# Patient Record
Sex: Female | Born: 1955
Health system: Southern US, Community
[De-identification: ages and names within clinical notes are randomized; demographics above are authoritative.]

## PROBLEM LIST (undated history)

## (undated) DIAGNOSIS — G4730 Sleep apnea, unspecified: Secondary | ICD-10-CM

## (undated) DIAGNOSIS — E119 Type 2 diabetes mellitus without complications: Secondary | ICD-10-CM

## (undated) DIAGNOSIS — R002 Palpitations: Secondary | ICD-10-CM

## (undated) DIAGNOSIS — I10 Essential (primary) hypertension: Secondary | ICD-10-CM

## (undated) DIAGNOSIS — M1990 Unspecified osteoarthritis, unspecified site: Secondary | ICD-10-CM

## (undated) DIAGNOSIS — E785 Hyperlipidemia, unspecified: Secondary | ICD-10-CM

## (undated) HISTORY — PX: ABDOMINAL HYSTERECTOMY: SHX81

## (undated) HISTORY — PX: EYE SURGERY: SHX253

---

## 1998-07-16 DIAGNOSIS — I10 Essential (primary) hypertension: Secondary | ICD-10-CM | POA: Insufficient documentation

## 2002-07-16 DIAGNOSIS — M1990 Unspecified osteoarthritis, unspecified site: Secondary | ICD-10-CM | POA: Insufficient documentation

## 2011-04-23 DIAGNOSIS — R609 Edema, unspecified: Secondary | ICD-10-CM | POA: Insufficient documentation

## 2012-01-09 HISTORY — PX: JOINT REPLACEMENT: SHX530

## 2012-02-14 DIAGNOSIS — M25561 Pain in right knee: Secondary | ICD-10-CM | POA: Insufficient documentation

## 2012-02-14 DIAGNOSIS — J069 Acute upper respiratory infection, unspecified: Secondary | ICD-10-CM | POA: Insufficient documentation

## 2012-02-14 HISTORY — DX: Pain in right knee: M25.561

## 2013-11-17 DIAGNOSIS — E119 Type 2 diabetes mellitus without complications: Secondary | ICD-10-CM | POA: Insufficient documentation

## 2014-07-16 DIAGNOSIS — G4733 Obstructive sleep apnea (adult) (pediatric): Secondary | ICD-10-CM | POA: Insufficient documentation

## 2014-07-24 ENCOUNTER — Encounter: Payer: Self-pay | Admitting: Emergency Medicine

## 2014-07-24 ENCOUNTER — Emergency Department: Payer: 59

## 2014-07-24 ENCOUNTER — Emergency Department
Admission: EM | Admit: 2014-07-24 | Discharge: 2014-07-25 | Disposition: A | Discharge status: Home / Self Care | Payer: 59 | Attending: Emergency Medicine | Admitting: Emergency Medicine

## 2014-07-24 DIAGNOSIS — I10 Essential (primary) hypertension: Secondary | ICD-10-CM | POA: Diagnosis not present

## 2014-07-24 DIAGNOSIS — L03115 Cellulitis of right lower limb: Secondary | ICD-10-CM | POA: Diagnosis not present

## 2014-07-24 DIAGNOSIS — E119 Type 2 diabetes mellitus without complications: Secondary | ICD-10-CM | POA: Insufficient documentation

## 2014-07-24 DIAGNOSIS — M25561 Pain in right knee: Secondary | ICD-10-CM | POA: Insufficient documentation

## 2014-07-24 DIAGNOSIS — R2241 Localized swelling, mass and lump, right lower limb: Secondary | ICD-10-CM | POA: Diagnosis present

## 2014-07-24 HISTORY — DX: Essential (primary) hypertension: I10

## 2014-07-24 HISTORY — DX: Unspecified osteoarthritis, unspecified site: M19.90

## 2014-07-24 HISTORY — DX: Type 2 diabetes mellitus without complications: E11.9

## 2014-07-24 NOTE — ED Notes (Signed)
Pt drove self to ED, pt reports that approx two days ago noticed redness at right knee across old knee incision mark but redness has since subsided to area marked by this RN at this time, pt also c/o tightness behind right knee, area feels tight. Pt reports ants in home and pointed out a possible bite mark but denies itching positive for HA but since HA passed and is now feeling no pain only "discomfort", abd decreased appetite.

## 2014-07-25 MED ORDER — CLINDAMYCIN HCL 300 MG PO CAPS: 300.0000 mg | ORAL_CAPSULE | Freq: Three times a day (TID) | ORAL | Status: DC

## 2014-07-25 MED ORDER — CLINDAMYCIN HCL 150 MG PO CAPS
300.0000 mg | ORAL_CAPSULE | Freq: Once | ORAL | Status: AC
  Administered 2014-07-25: 300 mg via ORAL
  Filled 2014-07-25: qty 2

## 2014-07-25 NOTE — Discharge Instructions (Signed)
Follow up with the primary care provider of your choice or return to the ER for symptoms that are not improving over the next 2 days. Return sooner if the redness extends outside of the marked area or if you develop a fever.

## 2014-07-25 NOTE — ED Provider Notes (Signed)
[facility] Emergency Department Provider Note ____________________________________________  Time seen: Approximately 12:10 AM  I have reviewed the triage vital signs and the nursing notes.   HISTORY  Chief Complaint Rash   HPI Kelly Farley is a 59 y.o. female who presents to the emergency department for right leg swelling and redness. She states that she noticed a little redness about 2 days ago, but didn't realize that it had gone around to the back of the knee. She states the back of the knee feels tight and is mildly tender. She has a 5 1/2 hour drive tomorrow and wants to have it checked out. She has never had a DVT. She questions an insect bite causing the redness, but isn't sure.   Past Medical History  Diagnosis Date  . Diabetes mellitus without complication   . Hypertension   . Arthritis     There are no active problems to display for this patient.   Past Surgical History  Procedure Laterality Date  . Abdominal hysterectomy      total  . Joint replacement  2014    right knee  . Eye surgery      Current Outpatient Rx  Name  Route  Sig  Dispense  Refill  . clindamycin (CLEOCIN) 300 MG capsule   Oral   Take 1 capsule (300 mg total) by mouth 3 (three) times daily.   30 capsule   0     Allergies Peanut-containing drug products  History reviewed. No pertinent family history.  Social History History  Substance Use Topics  . Smoking status: Never Smoker   . Smokeless tobacco: Not on file  . Alcohol Use: No    Review of Systems Constitutional: No recent illness. Eyes: No visual changes. ENT: No sore throat. Cardiovascular: Denies chest pain or palpitations. Respiratory: Denies shortness of breath. Gastrointestinal: No abdominal pain.  Genitourinary: Negative for dysuria. Musculoskeletal: Pain in right knee/lower extremity Skin: Negative for rash. Neurological: Negative for headaches, focal weakness or numbness. 10-point  ROS otherwise negative.  ____________________________________________   PHYSICAL EXAM:  VITAL SIGNS: ED Triage Vitals  Enc Vitals Group     BP 07/24/14 2113 143/64 mmHg     Pulse Rate 07/24/14 2113 73     Resp 07/24/14 2113 18     Temp 07/24/14 2113 98.3 F (36.8 C)     Temp Source 07/24/14 2113 Oral     SpO2 07/24/14 2113 97 %     Weight 07/24/14 2113 320 lb (145.151 kg)     Height 07/24/14 2113 5\' 8"  (1.727 m)     Head Cir --      Peak Flow --      Pain Score --      Pain Loc --      Pain Edu? --      Excl. in [place]? --     Constitutional: Alert and oriented. Well appearing and in no acute distress. Eyes: Conjunctivae are normal. EOMI. Head: Atraumatic. Nose: No congestion/rhinnorhea. Neck: No stridor.  Respiratory: Normal respiratory effort.   Musculoskeletal: Full ROM of right knee and lower extremity. Neurologic:  Normal speech and language. No gross focal neurologic deficits are appreciated. Speech is normal. No gait instability. Skin: Erythematous area progresses from medial to lateral knee posteriorly but does not involve the anterior portion/patella area. Erythematous area marked. No rash. Increased skin temperature over erythematous area.  Psychiatric: Mood and affect are normal. Speech and behavior are normal.  ____________________________________________   LABS (all  labs ordered are listed, but only abnormal results are displayed)  Labs Reviewed - No data to display ____________________________________________  RADIOLOGY  Negative for DVT. ____________________________________________   PROCEDURES  Procedure(s) performed: None   ____________________________________________   INITIAL IMPRESSION / ASSESSMENT AND PLAN / ED COURSE  Pertinent labs & imaging results that were available during my care of the patient were reviewed by me and considered in my medical decision making (see chart for details).  We will start clindamycin tonight.  Patient  was advised to follow-up with her primary care provider or return to the emergency department for symptoms that are not improving over the next 2 days. She was advised to return to the emergency department sooner for symptoms that change or worsen or if the redness extends outside of the marked line. She was also advised to return to the emergency department for any shortness of breath or chest pain. ____________________________________________   FINAL CLINICAL IMPRESSION(S) / ED DIAGNOSES  Final diagnoses:  Cellulitis of right lower extremity       Victorino Dike, FNP 07/25/14 0018  Hinda Kehr, MD 07/25/14 1027

## 2015-10-20 DIAGNOSIS — N951 Menopausal and female climacteric states: Secondary | ICD-10-CM

## 2015-10-20 HISTORY — DX: Menopausal and female climacteric states: N95.1

## 2016-01-20 DIAGNOSIS — M10072 Idiopathic gout, left ankle and foot: Secondary | ICD-10-CM | POA: Insufficient documentation

## 2016-01-20 DIAGNOSIS — J40 Bronchitis, not specified as acute or chronic: Secondary | ICD-10-CM | POA: Insufficient documentation

## 2018-07-16 DIAGNOSIS — E782 Mixed hyperlipidemia: Secondary | ICD-10-CM | POA: Insufficient documentation

## 2018-08-14 ENCOUNTER — Other Ambulatory Visit: Payer: Self-pay | Admitting: Internal Medicine

## 2018-08-14 DIAGNOSIS — Z1231 Encounter for screening mammogram for malignant neoplasm of breast: Secondary | ICD-10-CM

## 2018-09-24 ENCOUNTER — Ambulatory Visit
Admission: RE | Admit: 2018-09-24 | Discharge: 2018-09-24 | Disposition: A | Discharge status: Home / Self Care | Payer: 59 | Source: Ambulatory Visit | Attending: Internal Medicine | Admitting: Internal Medicine

## 2018-09-24 DIAGNOSIS — Z1231 Encounter for screening mammogram for malignant neoplasm of breast: Secondary | ICD-10-CM

## 2018-10-01 ENCOUNTER — Other Ambulatory Visit: Payer: Self-pay | Admitting: Internal Medicine

## 2018-10-02 ENCOUNTER — Other Ambulatory Visit: Payer: Self-pay | Admitting: Internal Medicine

## 2018-10-02 DIAGNOSIS — N6310 Unspecified lump in the right breast, unspecified quadrant: Secondary | ICD-10-CM

## 2018-10-02 DIAGNOSIS — Z1231 Encounter for screening mammogram for malignant neoplasm of breast: Secondary | ICD-10-CM

## 2018-10-15 ENCOUNTER — Ambulatory Visit
Admission: RE | Admit: 2018-10-15 | Discharge: 2018-10-15 | Disposition: A | Discharge status: Home / Self Care | Payer: Medicare HMO | Source: Ambulatory Visit | Attending: Internal Medicine | Admitting: Internal Medicine

## 2018-10-15 DIAGNOSIS — N6315 Unspecified lump in the right breast, overlapping quadrants: Secondary | ICD-10-CM | POA: Diagnosis present

## 2018-10-15 DIAGNOSIS — N6310 Unspecified lump in the right breast, unspecified quadrant: Secondary | ICD-10-CM | POA: Diagnosis not present

## 2018-10-15 DIAGNOSIS — Z1231 Encounter for screening mammogram for malignant neoplasm of breast: Secondary | ICD-10-CM

## 2019-01-30 ENCOUNTER — Ambulatory Visit: Payer: Medicare HMO

## 2019-07-16 DIAGNOSIS — N189 Chronic kidney disease, unspecified: Secondary | ICD-10-CM | POA: Insufficient documentation

## 2020-01-13 ENCOUNTER — Ambulatory Visit: Payer: Medicare Other | Admitting: Dermatology

## 2020-01-13 ENCOUNTER — Other Ambulatory Visit: Payer: Self-pay

## 2020-01-13 DIAGNOSIS — Z733 Stress, not elsewhere classified: Secondary | ICD-10-CM

## 2020-01-13 DIAGNOSIS — R21 Rash and other nonspecific skin eruption: Secondary | ICD-10-CM

## 2020-01-13 MED ORDER — TRIAMCINOLONE ACETONIDE 0.1 % EX CREA: 1.0000 "application " | TOPICAL_CREAM | Freq: Two times a day (BID) | CUTANEOUS | 2 refills | Status: DC

## 2020-01-13 MED ORDER — FEXOFENADINE HCL 180 MG PO TABS: 180.0000 mg | ORAL_TABLET | Freq: Every day | ORAL | 1 refills | Status: DC

## 2020-01-13 NOTE — Patient Instructions (Signed)
Recommend Allegra (Fexofenadine) 180 mg 1 tablet daily. If not improving after 2 weeks, increase to 2 tablets daily.

## 2020-01-13 NOTE — Progress Notes (Unsigned)
   New Patient Visit  Subjective  Kelly Farley is a 65 y.o. female who presents for the following: Rash (Of arms and legs x ~ 6 weeks that is very itchy. Was treated with Medrol pack and Mometsone from PCP. She lost her mother in October and has had a lot of increased stress.).  The following portions of the chart were reviewed this encounter and updated as appropriate:   Allergies  Meds  Problems  Med Hx  Surg Hx  Fam Hx     Review of Systems:  No other skin or systemic complaints except as noted in HPI or Assessment and Plan.  Objective  Well appearing patient in no apparent distress; mood and affect are within normal limits.  A focused examination was performed including arms, legs. Relevant physical exam findings are noted in the Assessment and Plan.  Objective  Left Upper Arm - Anterior: 3 mm pink papules, some with crusts scattered over arms. Some urticarial patches of arms.  Images     Assessment & Plan  Rash - Urticaria - exacerbated/flared by stress Left Arm; L leg Persistent; chronic x 6 weeks Recommend Allegra (Fexofenadine) 180 mg 1 tablet daily. If not improving after 2- 5 days, increase to 2 tablets daily.  Start TMC 0.1% cream bid - avoid face, groin, underarms.  May plan biopsy on follow up if not improving. May consider other treatments: Doxepin; Xolair in future  triamcinolone (KENALOG) 0.1 % - Left Upper Arm - Anterior  fexofenadine (ALLEGRA ALLERGY) 180 MG tablet - Left Upper Arm - Anterior  Return for 3-5 weeks .  I, Joanie Coddington, CMA, am acting as scribe for Armida Sans, MD .Documentation: I have reviewed the above documentation for accuracy and completeness, and I agree with the above.  Armida Sans, MD

## 2020-01-14 ENCOUNTER — Encounter: Payer: Self-pay | Admitting: Dermatology

## 2020-02-17 ENCOUNTER — Encounter: Payer: Self-pay | Admitting: Dermatology

## 2020-02-17 ENCOUNTER — Ambulatory Visit: Payer: Medicare Other | Admitting: Dermatology

## 2020-02-17 ENCOUNTER — Other Ambulatory Visit: Payer: Self-pay

## 2020-02-17 DIAGNOSIS — L501 Idiopathic urticaria: Secondary | ICD-10-CM | POA: Diagnosis not present

## 2020-02-17 DIAGNOSIS — L509 Urticaria, unspecified: Secondary | ICD-10-CM

## 2020-02-17 NOTE — Progress Notes (Signed)
   Follow-Up Visit   Subjective  Kelly Farley is a 65 y.o. female who presents for the following: Urticaria exacerbated/flared by stress (L arm, 5wk f/u, TMC 0.1% cr bid, Allegra 180 mg 1 po qd, sinus not getting dried out with allegra, improved).  The following portions of the chart were reviewed this encounter and updated as appropriate:   Allergies  Meds  Problems  Med Hx  Surg Hx  Fam Hx     Review of Systems:  No other skin or systemic complaints except as noted in HPI or Assessment and Plan.  Objective  Well appearing patient in no apparent distress; mood and affect are within normal limits.  A focused examination was performed including L arm. Relevant physical exam findings are noted in the Assessment and Plan.  Objective  L arm: 1 urticarial plaque L arm   Assessment & Plan  Urticaria L arm Chronic Idiopathic Urticaria Improved, but persistent - not to goal Exacerbated/flared by stress Other possibilities are bite reaction vs medication reaction. Improved on oral Allegra and topical Triamcinolone  Recommend labs -- pt has had labs this week with PCP-- will review  Increase to Allegra 2 po qhs Cont TMC 0.1% cr qd/bid, avoid f/g/a  Consider other options such as Xolair but not necessarily at this time Consider biopsy if persistent or flare,  but not necessarily this time  Recent labs from Dr. Elijio Miles, she will get a copy at appointment today and drop off at the office  Return in about 6 weeks (around 03/30/2020) for Urticaria f/u.   I, Kelly Farley, RMA, am acting as scribe for [name], MD .  Documentation: I have reviewed the above documentation for accuracy and completeness, and I agree with the above.  [name], MD

## 2020-03-10 ENCOUNTER — Other Ambulatory Visit: Payer: Self-pay | Admitting: Internal Medicine

## 2020-03-31 ENCOUNTER — Ambulatory Visit: Payer: Medicare Other | Admitting: Dermatology

## 2020-04-13 DIAGNOSIS — M25562 Pain in left knee: Secondary | ICD-10-CM | POA: Diagnosis not present

## 2020-04-13 DIAGNOSIS — M1712 Unilateral primary osteoarthritis, left knee: Secondary | ICD-10-CM | POA: Diagnosis not present

## 2020-04-21 DIAGNOSIS — M25562 Pain in left knee: Secondary | ICD-10-CM | POA: Diagnosis not present

## 2020-04-21 DIAGNOSIS — M1712 Unilateral primary osteoarthritis, left knee: Secondary | ICD-10-CM | POA: Diagnosis not present

## 2020-04-27 ENCOUNTER — Other Ambulatory Visit: Payer: Self-pay | Admitting: Dermatology

## 2020-04-27 DIAGNOSIS — R21 Rash and other nonspecific skin eruption: Secondary | ICD-10-CM

## 2020-04-28 DIAGNOSIS — M25562 Pain in left knee: Secondary | ICD-10-CM | POA: Diagnosis not present

## 2020-04-28 DIAGNOSIS — M1712 Unilateral primary osteoarthritis, left knee: Secondary | ICD-10-CM | POA: Diagnosis not present

## 2020-05-27 DIAGNOSIS — Z9842 Cataract extraction status, left eye: Secondary | ICD-10-CM | POA: Diagnosis not present

## 2020-05-27 DIAGNOSIS — Z9841 Cataract extraction status, right eye: Secondary | ICD-10-CM | POA: Diagnosis not present

## 2020-05-27 DIAGNOSIS — E119 Type 2 diabetes mellitus without complications: Secondary | ICD-10-CM | POA: Diagnosis not present

## 2020-05-27 DIAGNOSIS — H52223 Regular astigmatism, bilateral: Secondary | ICD-10-CM | POA: Diagnosis not present

## 2020-05-30 DIAGNOSIS — E119 Type 2 diabetes mellitus without complications: Secondary | ICD-10-CM | POA: Diagnosis not present

## 2020-06-14 DIAGNOSIS — M109 Gout, unspecified: Secondary | ICD-10-CM | POA: Diagnosis not present

## 2020-06-14 DIAGNOSIS — N189 Chronic kidney disease, unspecified: Secondary | ICD-10-CM | POA: Diagnosis not present

## 2020-06-14 DIAGNOSIS — E119 Type 2 diabetes mellitus without complications: Secondary | ICD-10-CM | POA: Diagnosis not present

## 2020-06-14 DIAGNOSIS — G4733 Obstructive sleep apnea (adult) (pediatric): Secondary | ICD-10-CM | POA: Diagnosis not present

## 2020-06-14 DIAGNOSIS — E785 Hyperlipidemia, unspecified: Secondary | ICD-10-CM | POA: Diagnosis not present

## 2020-06-15 ENCOUNTER — Other Ambulatory Visit: Payer: Self-pay | Admitting: Internal Medicine

## 2020-06-15 DIAGNOSIS — Z1231 Encounter for screening mammogram for malignant neoplasm of breast: Secondary | ICD-10-CM

## 2020-08-03 ENCOUNTER — Emergency Department (HOSPITAL_COMMUNITY)
Admission: EM | Admit: 2020-08-03 | Discharge: 2020-08-04 | Discharge status: Against Medical Advice | Payer: Medicare Other | Attending: Emergency Medicine | Admitting: Emergency Medicine

## 2020-08-03 ENCOUNTER — Encounter (HOSPITAL_COMMUNITY): Payer: Self-pay | Admitting: Emergency Medicine

## 2020-08-03 DIAGNOSIS — Z79899 Other long term (current) drug therapy: Secondary | ICD-10-CM | POA: Insufficient documentation

## 2020-08-03 DIAGNOSIS — Z91010 Allergy to peanuts: Secondary | ICD-10-CM | POA: Insufficient documentation

## 2020-08-03 DIAGNOSIS — E119 Type 2 diabetes mellitus without complications: Secondary | ICD-10-CM | POA: Diagnosis not present

## 2020-08-03 DIAGNOSIS — Z96651 Presence of right artificial knee joint: Secondary | ICD-10-CM | POA: Diagnosis not present

## 2020-08-03 DIAGNOSIS — I10 Essential (primary) hypertension: Secondary | ICD-10-CM | POA: Insufficient documentation

## 2020-08-03 DIAGNOSIS — K922 Gastrointestinal hemorrhage, unspecified: Secondary | ICD-10-CM | POA: Diagnosis not present

## 2020-08-03 DIAGNOSIS — Z7984 Long term (current) use of oral hypoglycemic drugs: Secondary | ICD-10-CM | POA: Diagnosis not present

## 2020-08-03 DIAGNOSIS — K921 Melena: Secondary | ICD-10-CM

## 2020-08-03 DIAGNOSIS — R1084 Generalized abdominal pain: Secondary | ICD-10-CM

## 2020-08-03 LAB — CBC
HCT: 39.7 % (ref 36.0–46.0)
Hemoglobin: 13 g/dL (ref 12.0–15.0)
MCH: 29.8 pg (ref 26.0–34.0)
MCHC: 32.7 g/dL (ref 30.0–36.0)
MCV: 91.1 fL (ref 80.0–100.0)
Platelets: 253 10*3/uL (ref 150–400)
RBC: 4.36 MIL/uL (ref 3.87–5.11)
RDW: 12.4 % (ref 11.5–15.5)
WBC: 7.6 10*3/uL (ref 4.0–10.5)
nRBC: 0 % (ref 0.0–0.2)

## 2020-08-03 LAB — COMPREHENSIVE METABOLIC PANEL
ALT: 18 U/L (ref 0–44)
AST: 25 U/L (ref 15–41)
Albumin: 3.4 g/dL — ABNORMAL LOW (ref 3.5–5.0)
Alkaline Phosphatase: 60 U/L (ref 38–126)
Anion gap: 11 (ref 5–15)
BUN: 14 mg/dL (ref 8–23)
CO2: 26 mmol/L (ref 22–32)
Calcium: 9.4 mg/dL (ref 8.9–10.3)
Chloride: 100 mmol/L (ref 98–111)
Creatinine, Ser: 1.21 mg/dL — ABNORMAL HIGH (ref 0.44–1.00)
GFR, Estimated: 49 mL/min — ABNORMAL LOW (ref >60)
Glucose, Bld: 124 mg/dL — ABNORMAL HIGH (ref 70–99)
Potassium: 3.4 mmol/L — ABNORMAL LOW (ref 3.5–5.1)
Sodium: 137 mmol/L (ref 135–145)
Total Bilirubin: 0.9 mg/dL (ref 0.3–1.2)
Total Protein: 6.9 g/dL (ref 6.5–8.1)

## 2020-08-03 LAB — TYPE AND SCREEN
ABO/RH(D): O POS
Antibody Screen: NEGATIVE

## 2020-08-03 NOTE — ED Provider Notes (Cosign Needed)
Emergency Medicine Provider Triage Evaluation Note  Flower Aburto , a 65 y.o. female  was evaluated in triage.  Pt complains of abdominal pain and Bloody stools.  Patient states this started on Monday, came on suddenly, she states that she has dark red blood in the stools on Monday, she states she had some today but  It is not as much.  She has no history of GI bleeds, no history of diverticulitis, she not on anticoagulants.  She denies feeling lightheaded, dizziness, chest pain or shortness of breath.  She states that she is having some stomach cramps but this has since resolved.    Review of Systems  Positive: Stomach cramps, melena Negative: Chest pain, shortness of breath  Physical Exam  BP 136/66   Pulse 67   Temp 98.8 F (37.1 C) (Oral)   Resp 18   Ht 5' 7.5" (1.715 m)   Wt (!) 149.7 kg   SpO2 99%   BMI 50.92 kg/m  Gen:   Awake, no distress   Resp:  Normal effort  MSK:   Moves extremities without difficulty  Other:  Was palpated no peritoneal sign, no rebound tenderness, no guarding.  Medical Decision Making  Medically screening exam initiated at 12:20 PM.  Appropriate orders placed.  Maija Susman was informed that the remainder of the evaluation will be completed by another provider, this initial triage assessment does not replace that evaluation, and the importance of remaining in the ED until their evaluation is complete.  Patient presents with bloody stools, patient appears Emergency Department.   Marcello Fennel, PA-C 08/03/20 1250

## 2020-08-03 NOTE — ED Provider Notes (Signed)
[facility] EMERGENCY DEPARTMENT Provider Note   CSN: [id number] Arrival date & time: 08/03/20  1132     History Chief Complaint  Patient presents with   Abdominal Pain    Kelly Farley is a 65 y.o. female presents to the emergency department with abdominal pain and bloody stools that started on Monday.    Upon my arrival to bedside evaluate patient she began yelling and swearing.  She states she has been in the waiting room for 12 hours and in this bed for 30 minutes without a pillow and no one has done anything for her.  She states she is leaving and she does not want to speak to anyone else.   Per the provider in triage note: Reports the stool has been dark red.  It has improved over the last several days but has not resolved.  She is no history of GI bleeds or diverticulitis.  She does not take an anticoagulant.  She denies lightheadedness, dizziness, chest pain or shortness of breath.  Also complained of associated abdominal cramping.  The history is provided by the patient and medical records. No language interpreter was used.      Past Medical History:  Diagnosis Date   Arthritis    Diabetes mellitus without complication ([place])    Hypertension     There are no problems to display for this patient.   Past Surgical History:  Procedure Laterality Date   ABDOMINAL HYSTERECTOMY     total   EYE SURGERY     JOINT REPLACEMENT  2014   right knee     OB History   No obstetric history on file.     Family History  Problem Relation Age of Onset   Breast cancer Maternal Aunt     Social History   Tobacco Use   Smoking status: Never  Substance Use Topics   Alcohol use: No    Home Medications Prior to Admission medications   Medication Sig Start Date End Date Taking? Authorizing Provider  allopurinol (ZYLOPRIM) 100 MG tablet Take 100 mg by mouth daily. 12/21/19   [provider]  amLODipine (NORVASC) 10 MG tablet Take by mouth.     [provider]  clindamycin (CLEOCIN) 300 MG capsule Take 1 capsule (300 mg total) by mouth 3 (three) times daily. 07/25/14   Triplett, Cari B, FNP  DULoxetine (CYMBALTA) 60 MG capsule Take by mouth. 03/20/16   [provider]  fexofenadine (ALLEGRA) 180 MG tablet TAKE 1 TABLET BY MOUTH EVERY DAY . IF NOT IMPROVING AFTER 2 WEEKS, INCREASE TO 2 TABLETS DAILY 04/27/20   Ralene Bathe, MD  fexofenadine-pseudoephedrine (ALLEGRA-D ALLERGY & CONGESTION) 60-120 MG 12 hr tablet TK 1 T PO  QHS 02/15/15   [provider]  glimepiride (AMARYL) 2 MG tablet Take by mouth. 08/22/16   [provider]  loteprednol (ALREX) 0.2 % SUSP  02/15/15   [provider]  metFORMIN (GLUCOPHAGE) 1000 MG tablet Take by mouth. 05/11/16   [provider]  metoprolol tartrate (LOPRESSOR) 100 MG tablet Take by mouth. 08/22/16   [provider]  mometasone (ELOCON) 0.1 % cream Apply topically. 12/07/19   [provider]  naproxen sodium (ALEVE) 220 MG tablet Take by mouth. 03/03/14   [provider]  nystatin-triamcinolone ointment (MYCOLOG) Apply 1 Application topically 3 (three) times daily. 07/13/14   [provider]  pravastatin (PRAVACHOL) 40 MG tablet Take by mouth. 02/14/16   [provider]  sitaGLIPtin (JANUVIA) 100 MG tablet Take by mouth. 05/01/16   [provider]  triamcinolone (KENALOG) 0.1 % Apply 1 application topically 2 (two) times daily. Avoid face, groin, underarms 01/13/20   Ralene Bathe, MD  valsartan-hydrochlorothiazide (DIOVAN-HCT) 320-25 MG tablet Take 1 tablet by mouth daily. 12/17/19   [provider]    Allergies    Peanut-containing drug products  Review of Systems   Review of Systems  Constitutional:  Negative for fatigue.  Respiratory:  Negative for shortness of breath.   Gastrointestinal:  Positive for abdominal pain and blood in stool.  Neurological:  Negative for syncope and weakness.    Physical Exam Updated Vital Signs BP (!) 149/73 (BP Location: Right Arm)   Pulse 64   Temp 98 F (36.7 C) (Oral)   Resp 18   Ht 5' 7.5" (1.715 m)   Wt (!) 149.7 kg   SpO2 97%   BMI 50.92 kg/m   Physical Exam Vitals and nursing note reviewed.  Constitutional:      General: She is not in acute distress.    Appearance: She is well-developed. She is not ill-appearing.     Comments: Yelling -speaks in full sentences  HENT:     Head: Normocephalic.  Eyes:     General: No scleral icterus.    Conjunctiva/sclera: Conjunctivae normal.  Cardiovascular:     Rate and Rhythm: Normal rate.  Pulmonary:     Effort: Pulmonary effort is normal.  Abdominal:     General: There is no distension.  Musculoskeletal:        General: Normal range of motion.     Cervical back: Normal range of motion.     Comments: Nasal extremities without difficulty.  Skin:    General: Skin is warm and dry.  Neurological:     Mental Status: She is alert.  Psychiatric:        Mood and Affect: Mood normal.    ED Results / Procedures / Treatments   Labs (all labs ordered are listed, but only abnormal results are displayed) Labs Reviewed  COMPREHENSIVE METABOLIC PANEL - Abnormal; Notable for the following components:      Result Value   Potassium 3.4 (*)    Glucose, Bld 124 (*)    Creatinine, Ser 1.21 (*)    Albumin 3.4 (*)    GFR, Estimated 49 (*)    All other components within normal limits  CBC  POC OCCULT BLOOD, ED  TYPE AND SCREEN  ABO/RH     Procedures Procedures   Medications Ordered in ED Medications - No data to display  ED Course  I have reviewed the triage vital signs and the nursing notes.  Pertinent labs & imaging results that were available during my care of the patient were reviewed by me and considered in my medical decision making (see chart for details).    MDM Rules/Calculators/A&P                           Presents with abdominal pain and GI bleeding.  Patient  angry upon my initial evaluation, yelling that no one has given her a pillow.  She does not want a physical evaluation by myself or any further work-up.  I have discussed with patient that without further evaluation I cannot guarantee that she does not have a medical emergency and discussed the risk of morbidity and mortality.  Patient reports that she does not care because no  one here seems to care about her.  She demands a wheelchair and to be wheeled out of the emergency department.  Patient informed that she may return at any time for additional work-up or further evaluation.  Patient left the emergency department AMA.   Final Clinical Impression(s) / ED Diagnoses Final diagnoses:  Generalized abdominal pain  Blood in stool    Rx / DC Orders ED Discharge Orders     None        Lux Meaders, Gwenlyn Perking 08/03/20 2333    Ripley Fraise, MD 08/04/20 [phone]

## 2020-08-03 NOTE — ED Triage Notes (Signed)
Pt endorses abd cramping and pain 2 days ago. Started having blood in her diarrhea yesterday. Denies blood thinners. Reports increase in aleve for knee pain.

## 2020-08-06 ENCOUNTER — Emergency Department
Admission: EM | Admit: 2020-08-06 | Discharge: 2020-08-06 | Disposition: A | Discharge status: Home / Self Care | Payer: Medicare Other | Attending: Emergency Medicine | Admitting: Emergency Medicine

## 2020-08-06 ENCOUNTER — Emergency Department: Payer: Medicare Other

## 2020-08-06 ENCOUNTER — Other Ambulatory Visit: Payer: Self-pay

## 2020-08-06 DIAGNOSIS — R141 Gas pain: Secondary | ICD-10-CM | POA: Diagnosis not present

## 2020-08-06 DIAGNOSIS — Z79899 Other long term (current) drug therapy: Secondary | ICD-10-CM | POA: Diagnosis not present

## 2020-08-06 DIAGNOSIS — R109 Unspecified abdominal pain: Secondary | ICD-10-CM | POA: Diagnosis not present

## 2020-08-06 DIAGNOSIS — I10 Essential (primary) hypertension: Secondary | ICD-10-CM | POA: Insufficient documentation

## 2020-08-06 DIAGNOSIS — Z91010 Allergy to peanuts: Secondary | ICD-10-CM | POA: Insufficient documentation

## 2020-08-06 DIAGNOSIS — K922 Gastrointestinal hemorrhage, unspecified: Secondary | ICD-10-CM

## 2020-08-06 DIAGNOSIS — Z7984 Long term (current) use of oral hypoglycemic drugs: Secondary | ICD-10-CM | POA: Insufficient documentation

## 2020-08-06 DIAGNOSIS — Z96652 Presence of left artificial knee joint: Secondary | ICD-10-CM | POA: Insufficient documentation

## 2020-08-06 LAB — COMPREHENSIVE METABOLIC PANEL
ALT: 19 U/L (ref 0–44)
AST: 36 U/L (ref 15–41)
Albumin: 3.9 g/dL (ref 3.5–5.0)
Alkaline Phosphatase: 57 U/L (ref 38–126)
Anion gap: 11 (ref 5–15)
BUN: 18 mg/dL (ref 8–23)
CO2: 26 mmol/L (ref 22–32)
Calcium: 9.3 mg/dL (ref 8.9–10.3)
Chloride: 101 mmol/L (ref 98–111)
Creatinine, Ser: 1.39 mg/dL — ABNORMAL HIGH (ref 0.44–1.00)
GFR, Estimated: 42 mL/min — ABNORMAL LOW (ref >60)
Glucose, Bld: 165 mg/dL — ABNORMAL HIGH (ref 70–99)
Potassium: 3.6 mmol/L (ref 3.5–5.1)
Sodium: 138 mmol/L (ref 135–145)
Total Bilirubin: 0.7 mg/dL (ref 0.3–1.2)
Total Protein: 7.5 g/dL (ref 6.5–8.1)

## 2020-08-06 LAB — PROTIME-INR
INR: 1.1 (ref 0.8–1.2)
Prothrombin Time: 13.7 seconds (ref 11.4–15.2)

## 2020-08-06 LAB — CBC
HCT: 40.6 % (ref 36.0–46.0)
Hemoglobin: 13.8 g/dL (ref 12.0–15.0)
MCH: 30.8 pg (ref 26.0–34.0)
MCHC: 34 g/dL (ref 30.0–36.0)
MCV: 90.6 fL (ref 80.0–100.0)
Platelets: 327 10*3/uL (ref 150–400)
RBC: 4.48 MIL/uL (ref 3.87–5.11)
RDW: 12.1 % (ref 11.5–15.5)
WBC: 4.6 10*3/uL (ref 4.0–10.5)
nRBC: 0 % (ref 0.0–0.2)

## 2020-08-06 MED ORDER — IOHEXOL 350 MG/ML SOLN
75.0000 mL | Freq: Once | INTRAVENOUS | Status: AC | PRN
  Administered 2020-08-06: 75 mL via INTRAVENOUS

## 2020-08-06 MED ORDER — FAMOTIDINE 40 MG PO TABS: 40.0000 mg | ORAL_TABLET | Freq: Every day | ORAL | 0 refills | Status: DC

## 2020-08-06 NOTE — ED Provider Notes (Signed)
[facility] Emergency Department Provider Note ____________________________________________   Event Date/Time   First MD Initiated Contact with Patient 08/06/20 1433     (approximate)  I have reviewed the triage vital signs and the nursing notes.   HISTORY  Chief Complaint Blood In Stools  HPI Kelly Farley is a 65 y.o. female with history of Diabetes and hypertension presents to the emergency department for treatment and evaluation of bright red blood in stool for the past 8 days. Symptoms started after eating a salad with avocado and had a vinegar drink. She states that her abdomen is "a little tender" and she feels gassy. When she passes gas, she notices blood. She has no constipation. Last bowel movement was this morning with streaks of blood. Mother had colon cancer. Last colonoscopy 10 years ago and was normal.     Past Medical History:  Diagnosis Date   Arthritis    Diabetes mellitus without complication ([place])    Hypertension     There are no problems to display for this patient.   Past Surgical History:  Procedure Laterality Date   ABDOMINAL HYSTERECTOMY     total   EYE SURGERY     JOINT REPLACEMENT  2014   right knee    Prior to Admission medications   Medication Sig Start Date End Date Taking? Authorizing Provider  famotidine (PEPCID) 40 MG tablet Take 1 tablet (40 mg total) by mouth daily. 08/06/20 09/05/20 Yes Payden Bonus B, FNP  allopurinol (ZYLOPRIM) 100 MG tablet Take 100 mg by mouth daily. 12/21/19   [provider]  amLODipine (NORVASC) 10 MG tablet Take by mouth.    [provider]  clindamycin (CLEOCIN) 300 MG capsule Take 1 capsule (300 mg total) by mouth 3 (three) times daily. 07/25/14   Camani Sesay B, FNP  DULoxetine (CYMBALTA) 60 MG capsule Take by mouth. 03/20/16   [provider]  fexofenadine (ALLEGRA) 180 MG tablet TAKE 1 TABLET BY MOUTH EVERY DAY . IF NOT IMPROVING AFTER 2 WEEKS, INCREASE  TO 2 TABLETS DAILY 04/27/20   Ralene Bathe, MD  fexofenadine-pseudoephedrine (ALLEGRA-D ALLERGY & CONGESTION) 60-120 MG 12 hr tablet TK 1 T PO  QHS 02/15/15   [provider]  glimepiride (AMARYL) 2 MG tablet Take by mouth. 08/22/16   [provider]  loteprednol (ALREX) 0.2 % SUSP  02/15/15   [provider]  metFORMIN (GLUCOPHAGE) 1000 MG tablet Take by mouth. 05/11/16   [provider]  metoprolol tartrate (LOPRESSOR) 100 MG tablet Take by mouth. 08/22/16   [provider]  mometasone (ELOCON) 0.1 % cream Apply topically. 12/07/19   [provider]  naproxen sodium (ALEVE) 220 MG tablet Take by mouth. 03/03/14   [provider]  nystatin-triamcinolone ointment (MYCOLOG) Apply 1 Application topically 3 (three) times daily. 07/13/14   [provider]  pravastatin (PRAVACHOL) 40 MG tablet Take by mouth. 02/14/16   [provider]  sitaGLIPtin (JANUVIA) 100 MG tablet Take by mouth. 05/01/16   [provider]  triamcinolone (KENALOG) 0.1 % Apply 1 application topically 2 (two) times daily. Avoid face, groin, underarms 01/13/20   Ralene Bathe, MD  valsartan-hydrochlorothiazide (DIOVAN-HCT) 320-25 MG tablet Take 1 tablet by mouth daily. 12/17/19   [provider]    Allergies Peanut-containing drug products  Family History  Problem Relation Age of Onset   Breast cancer Maternal Aunt     Social History Social History   Tobacco Use  Smoking status: Never  Substance Use Topics   Alcohol use: No    Review of Systems  Constitutional: No fever/chills Eyes: No visual changes. ENT: No sore throat. Cardiovascular: Denies chest pain. Respiratory: Denies shortness of breath. Gastrointestinal: Positive for abdominal pain.  No nausea, no vomiting.  No diarrhea.  No constipation. Positive for BRBPR. Genitourinary: Negative for dysuria. Musculoskeletal: Negative for back pain. Skin: Negative for  rash. Neurological: Negative for headaches, focal weakness or numbness. ____________________________________________   PHYSICAL EXAM:  VITAL SIGNS: ED Triage Vitals [08/06/20 1252]  Enc Vitals Group     BP 133/60     Pulse Rate 73     Resp 18     Temp      Temp src      SpO2 100 %     Weight (!) 330 lb (149.7 kg)     Height '5\' 7"'$  (1.702 m)     Head Circumference      Peak Flow      Pain Score 0     Pain Loc      Pain Edu?      Excl. in [place]?     Constitutional: Alert and oriented. Well appearing and in no acute distress. Eyes: Conjunctivae are normal. Head: Atraumatic. Nose: No congestion/rhinnorhea. Mouth/Throat: Mucous membranes are moist.  Oropharynx non-erythematous. Neck: No stridor.   Hematological/Lymphatic/Immunilogical: No cervical lymphadenopathy. Cardiovascular: Normal rate, regular rhythm. Grossly normal heart sounds.  Good peripheral circulation. Respiratory: Normal respiratory effort.  No retractions. Lungs CTAB. Gastrointestinal: Soft with tenderness in left mid and lower quadrant. No distention. No abdominal bruits.  Genitourinary:  Musculoskeletal: No lower extremity tenderness nor edema.  No joint effusions. Neurologic:  Normal speech and language. No gross focal neurologic deficits are appreciated. No gait instability. Skin:  Skin is warm, dry and intact. No rash noted. Psychiatric: Mood and affect are normal. Speech and behavior are normal.  ____________________________________________   LABS (all labs ordered are listed, but only abnormal results are displayed)  Labs Reviewed  COMPREHENSIVE METABOLIC PANEL - Abnormal; Notable for the following components:      Result Value   Glucose, Bld 165 (*)    Creatinine, Ser 1.39 (*)    GFR, Estimated 42 (*)    All other components within normal limits  CBC  PROTIME-INR  POC OCCULT BLOOD, ED  TYPE AND SCREEN   ____________________________________________  EKG  Not  indicated. ____________________________________________  RADIOLOGY  ED MD interpretation:    CT abdomen and pelvis negative for acute concerns.  I, Sherrie George, personally viewed and evaluated these images (plain radiographs) as part of my medical decision making, as well as reviewing the written report by the radiologist.  Official radiology report(s): CT ABDOMEN PELVIS W CONTRAST  Result Date: 08/06/2020 CLINICAL DATA:  Left lower quadrant abdominal pain and blood in stools. EXAM: CT ABDOMEN AND PELVIS WITH CONTRAST TECHNIQUE: Multidetector CT imaging of the abdomen and pelvis was performed using the standard protocol following bolus administration of intravenous contrast. CONTRAST:  25m OMNIPAQUE IOHEXOL 350 MG/ML SOLN COMPARISON:  Chest FINDINGS: Lower chest: No acute abnormality. Hepatobiliary: No focal liver abnormality is seen. No gallstones, gallbladder wall thickening, or biliary dilatation. Pancreas: Unremarkable. No pancreatic ductal dilatation or surrounding inflammatory changes. Spleen: Normal in size without focal abnormality. Adrenals/Urinary Tract: Adrenal glands are unremarkable. Multiple bilateral renal cysts, largest on the left measures 3.4 cm. No suspicious solid mass. No hydronephrosis or renal calculi. Urinary bladder is unremarkable. Stomach/Bowel: Stomach is within normal limits.  Small appendicolith within the appendix which is otherwise normal in appearance. No evidence of bowel wall thickening, distention, or inflammatory changes. Vascular/Lymphatic: Aortic atherosclerosis. No enlarged abdominal or pelvic lymph nodes. Reproductive: Status post hysterectomy. No adnexal masses. Other: There is a moderate-sized fat containing umbilical hernia. No abdominopelvic ascites. Musculoskeletal: Multilevel degenerative disc disease in the lumbar spine. IMPRESSION: 1. No acute intra-abdominal pathology. 2. Moderate-sized fat containing umbilical hernia. 3. Aortic atherosclerosis.  Aortic Atherosclerosis (ICD10-I70.0). Electronically Signed   By: Audie Pinto M.D.   On: 08/06/2020 15:42    ____________________________________________   PROCEDURES  Procedure(s) performed (including Critical Care):  Procedures  ____________________________________________   INITIAL IMPRESSION / ASSESSMENT AND PLAN     65 year old female presenting to the emergency department for treatment and evaluation of rectal bleeding. See HPI. Labs that have resulted while awaiting ER room assignment are reassuring. She is not currently on blood thinner, but states that she has been taking more aleve due to knee pain. Last dose was 3 days ago.   DIFFERENTIAL DIAGNOSIS  Differential diagnosis includes, but is not limited to,  diverticulitis, urinary tract infection/pyelonephritis, bowel obstruction, colitis, renal colic, gastroenteritis, hernia  ED COURSE  On exam, no external hemorrhoids. Hematochezia on internal exam. No blood passing externally.  CT of the abdomen and pelvis reassuring. Labs are reassuring as well. No indication of anemia.   Results discussed with patient. Plan will be to touch base with GI as she does not currently have an established provider.   Discussed with Dr. Bonna Gains who will see patient in clinic this coming week.   Plan is to discharge home with Pepcid Rx and strict ER return precautions. Patient is aware and agrees to the plan.    ___________________________________________   FINAL CLINICAL IMPRESSION(S) / ED DIAGNOSES  Final diagnoses:  Gastrointestinal hemorrhage, unspecified gastrointestinal hemorrhage type     ED Discharge Orders          Ordered    famotidine (PEPCID) 40 MG tablet  Daily        08/06/20 1703             Consepcion Zeni was evaluated in Emergency Department on 08/06/2020 for the symptoms described in the history of present illness. She was evaluated in the context of the global COVID-19 pandemic, which  necessitated consideration that the patient might be at risk for infection with the SARS-CoV-2 virus that causes COVID-19. Institutional protocols and algorithms that pertain to the evaluation of patients at risk for COVID-19 are in a state of rapid change based on information released by regulatory bodies including the CDC and federal and state organizations. These policies and algorithms were followed during the patient's care in the ED.   Note:  This document was prepared using Dragon voice recognition software and may include unintentional dictation errors.    Victorino Dike, FNP 08/06/20 1706    Vladimir Crofts, MD 08/06/20 [phone]

## 2020-08-06 NOTE — Discharge Instructions (Addendum)
Return to the ER immediately if you begin to have large amounts of bright red blood from rectum.  Follow up with GI this week. Someone from the clinic should call you on Monday. If you don't hear anything by late afternoon, please call.

## 2020-08-06 NOTE — ED Notes (Signed)
Pt transported to CT via stretcher at this time.  

## 2020-08-06 NOTE — ED Notes (Signed)
Pt provided with pillow at this time.

## 2020-08-06 NOTE — ED Triage Notes (Addendum)
Pt states that she has been having blood in her stools since Tuesday- pt states that every time she passes gas and has BM she has blood- pt states that she noticed clots in her last BM but on Tuesday she was having bright red blood in her diarrhea- pt states that she was at [facility] on Thursday for same and was never seen by a dr- pt denies blood thinner use, but states she had increased use of ibuprofen for knee pain

## 2020-08-06 NOTE — ED Notes (Signed)
Pt assisted to BR with use of cane. Pt tolerated well.

## 2020-08-09 ENCOUNTER — Other Ambulatory Visit: Payer: Self-pay

## 2020-08-09 ENCOUNTER — Ambulatory Visit: Payer: Medicare Other | Admitting: Gastroenterology

## 2020-08-09 VITALS — BP 127/79 | HR 66 | Temp 98.5°F | Wt 326.4 lb

## 2020-08-09 DIAGNOSIS — K625 Hemorrhage of anus and rectum: Secondary | ICD-10-CM | POA: Diagnosis not present

## 2020-08-09 MED ORDER — NA SULFATE-K SULFATE-MG SULF 17.5-3.13-1.6 GM/177ML PO SOLN: 1.0000 | Freq: Once | ORAL | 0 refills | Status: AC

## 2020-08-09 NOTE — Progress Notes (Signed)
Kelly Farley [address]  [place]  [place], [place] [address]  Main: [phone]  Fax: [phone]   Gastroenterology Consultation  Referring Provider:     Jodi Marble, MD Primary Care Physician:  Jodi Marble, MD Reason for Consultation: Bright red blood per rectum        HPI:    Chief Complaint  Patient presents with   New Patient (Initial Visit)   Hospitalization Follow-up  ER follow-up for bright red blood per rectum  Kelly Farley is a 65 y.o. y/o female referred for consultation & management  by Dr. Jodi Marble, MD. patient reports 1 week history of bright red blood per rectum.  States symptoms initially started as abdominal discomfort, and she had a formed bowel movement on that day, followed by another bowel movement in 2 to 3 hours that consisted of blood.  The next day, symptoms reoccurred and she went to the ER but due to the wait times, did not stay longer and went home.  CBC on 08/03/2020 was normal.  Symptoms continue to recur at home and therefore she visited the ER again 3 days later on 08/06/2020 and hemoglobin was again normal and patient was discharged with GI follow-up.  Patient states she has not seen any further blood in her stool since ER discharge on 08/06/2020.  Prior to this she was seen both bright red blood and blood clots.  No prior history of similar symptoms.  Describes having 1-2 formed bowel movements a day, without straining.  Reports family history of colon cancer in mother.  States had a colonoscopy about 10 years ago and was normal.  Reports having 3-4 colonoscopies in her lifetime and they have all been normal with no personal  history of polyps.  Denies abdominal pain, nausea or vomiting, dysphagia, weight loss.  ER notes from 08/03/2020 note that patient presented with abdominal pain and GI bleeding, but did not allow for physical evaluation and left AMA.  ER notes from 08/06/2020 reviewed and report that  hematochezia was seen on rectal exam, with no blood passing externally, no external hemorrhoids noted.  Past Medical History:  Diagnosis Date   Arthritis    Diabetes mellitus without complication ([place])    Hypertension     Past Surgical History:  Procedure Laterality Date   ABDOMINAL HYSTERECTOMY     total   EYE SURGERY     JOINT REPLACEMENT  2014   right knee    Prior to Admission medications   Medication Sig Start Date End Date Taking? Authorizing Provider  allopurinol (ZYLOPRIM) 100 MG tablet Take 100 mg by mouth daily. 12/21/19  Yes [provider]  amLODipine (NORVASC) 10 MG tablet Take by mouth.   Yes [provider]  dapagliflozin propanediol (FARXIGA) 5 MG TABS tablet Take by mouth daily.   Yes [provider]  famotidine (PEPCID) 40 MG tablet Take 1 tablet (40 mg total) by mouth daily. 08/06/20 09/05/20 Yes Triplett, Cari B, FNP  fexofenadine (ALLEGRA) 180 MG tablet TAKE 1 TABLET BY MOUTH EVERY DAY . IF NOT IMPROVING AFTER 2 WEEKS, INCREASE TO 2 TABLETS DAILY 04/27/20  Yes Ralene Bathe, MD  fexofenadine-pseudoephedrine (ALLEGRA-D) 60-120 MG 12 hr tablet TK 1 T PO  QHS 02/15/15  Yes [provider]  glimepiride (AMARYL) 2 MG tablet Take by mouth. 08/22/16  Yes [provider]  hydrALAZINE (APRESOLINE) 25 MG tablet Take 25 mg by mouth 3 (three) times daily.   Yes  [provider]  loteprednol (ALREX) 0.2 % SUSP  02/15/15  Yes [provider]  metoprolol tartrate (LOPRESSOR) 100 MG tablet Take by mouth. 08/22/16  Yes [provider]  mometasone (ELOCON) 0.1 % cream Apply topically. 12/07/19  Yes [provider]  Na Sulfate-K Sulfate-Mg Sulf 17.5-3.13-1.6 GM/177ML SOLN Take 1 kit by mouth once for 1 dose. 08/09/20 08/09/20 Yes Kelly Farley B, MD  naproxen sodium (ALEVE) 220 MG tablet Take by mouth. 03/03/14  Yes [provider]  nystatin-triamcinolone ointment (MYCOLOG) Apply 1 Application  topically 3 (three) times daily. 07/13/14  Yes [provider]  pravastatin (PRAVACHOL) 40 MG tablet Take by mouth. 02/14/16  Yes [provider]  triamcinolone (KENALOG) 0.1 % Apply 1 application topically 2 (two) times daily. Avoid face, groin, underarms 01/13/20  Yes Ralene Bathe, MD  valsartan-hydrochlorothiazide (DIOVAN-HCT) 320-25 MG tablet Take 1 tablet by mouth daily. 12/17/19  Yes [provider]    Family History  Problem Relation Age of Onset   Breast cancer Maternal Aunt      Social History   Tobacco Use   Smoking status: Never  Substance Use Topics   Alcohol use: No    Allergies as of 08/09/2020 - Review Complete 08/09/2020  Allergen Reaction Noted   Peanut-containing drug products Swelling and Rash 07/24/2014    Review of Systems:    All systems reviewed and negative except where noted in HPI.   Physical Exam:  Constitutional: General:   Alert,  Well-developed, well-nourished, pleasant and cooperative in NAD BP 127/79   Pulse 66   Temp 98.5 F (36.9 C) (Oral)   Wt (!) 326 lb 6.4 oz (148.1 kg)   BMI 51.12 kg/m   Eyes:  Sclera clear, no icterus.   Conjunctiva pink. PERRLA  Ears:  No scars, lesions or masses, Normal auditory acuity. Nose:  No deformity, discharge, or lesions. Mouth:  No deformity or lesions, oropharynx pink & moist.  Neck:  Supple; no masses or thyromegaly.  Respiratory: Normal respiratory effort, Normal percussion  Gastrointestinal:  No bruits.  Soft, non-tender and non-distended without masses, hepatosplenomegaly or hernias noted.  No guarding or rebound tenderness.     Cardiac: No clubbing or edema.  No cyanosis. Normal posterior tibial pedal pulses noted.  Lymphatic:  No significant cervical or axillary adenopathy.  Psych:  Alert and cooperative. Normal mood and affect.  Musculoskeletal:  Normal gait. Head normocephalic, atraumatic. Symmetrical without gross deformities. 5/5 Upper and Lower extremity  strength bilaterally.  Skin: Warm. Intact without significant lesions or rashes. No jaundice.  Neurologic:  Face symmetrical, tongue midline, Normal sensation to touch;  grossly normal neurologically.  Psych:  Alert and oriented x3, Alert and cooperative. Normal mood and affect.   Labs: CBC    Component Value Date/Time   WBC 4.6 08/06/2020 1254   RBC 4.48 08/06/2020 1254   HGB 13.8 08/06/2020 1254   HCT 40.6 08/06/2020 1254   PLT 327 08/06/2020 1254   MCV 90.6 08/06/2020 1254   MCH 30.8 08/06/2020 1254   MCHC 34.0 08/06/2020 1254   RDW 12.1 08/06/2020 1254   CMP     Component Value Date/Time   NA 138 08/06/2020 1254   K 3.6 08/06/2020 1254   CL 101 08/06/2020 1254   CO2 26 08/06/2020 1254   GLUCOSE 165 (H) 08/06/2020 1254   BUN 18 08/06/2020 1254   CREATININE 1.39 (H) 08/06/2020 1254   CALCIUM 9.3 08/06/2020 1254   PROT 7.5 08/06/2020 1254  ALBUMIN 3.9 08/06/2020 1254   AST 36 08/06/2020 1254   ALT 19 08/06/2020 1254   ALKPHOS 57 08/06/2020 1254   BILITOT 0.7 08/06/2020 1254   GFRNONAA 42 (L) 08/06/2020 1254    Imaging Studies: CT abdomen pelvis July 2022, with no acute intra-abdominal pathology, moderate sized fat-containing umbilical hernia  Assessment and Plan:   Kelly Farley is a 65 y.o. y/o female has been referred for bright red blood per rectum that started last week, with normal hemoglobin x2 since then, and no further episodes for the last 3 days  Given completely normal hemoglobin 3 days apart since symptoms started, symptoms most likely consistent with likely bleeding from possible internal hemorrhoids  However, patient has not had colonoscopy in 10 years according to her.  And has family history of colon cancer in mother.  Therefore, colonoscopy indicated for family history of colon cancer and further evaluation of bright red blood per rectum that occurred last week  Important to rule out malignancy, evaluate for hemorrhoids, polyps, AVMs  I have  discussed alternative options, risks & benefits,  which include, but are not limited to, bleeding, infection, perforation,respiratory complication & drug reaction.  The patient agrees with this plan & written consent will be obtained.    With patient's BMI, colonoscopy would be best done at [facility], instead of [place]  CT scan reassuring as per above treatment.  CBC reassuring, normal liver enzymes reassuring.  Dr Kelly Farley  Speech recognition software was used to dictate the above note.

## 2020-08-10 ENCOUNTER — Encounter: Payer: Self-pay | Admitting: Gastroenterology

## 2020-08-10 NOTE — Addendum Note (Signed)
Addended by: Lurlean Nanny on: 08/10/2020 08:31 AM   Modules accepted: Orders, SmartSet

## 2020-08-15 ENCOUNTER — Telehealth: Payer: Self-pay

## 2020-08-15 NOTE — Telephone Encounter (Signed)
Pt wanted confirmation of low fiber foods and medications she can take... pt is aware and expressed understanding

## 2020-08-18 ENCOUNTER — Other Ambulatory Visit: Payer: Self-pay

## 2020-08-18 ENCOUNTER — Ambulatory Visit
Admission: RE | Admit: 2020-08-18 | Discharge: 2020-08-18 | Disposition: A | Discharge status: Home / Self Care | Payer: Medicare Other | Attending: Gastroenterology | Admitting: Gastroenterology

## 2020-08-18 ENCOUNTER — Encounter: Payer: Self-pay | Admitting: Gastroenterology

## 2020-08-18 ENCOUNTER — Ambulatory Visit: Payer: Medicare Other | Admitting: Anesthesiology

## 2020-08-18 ENCOUNTER — Encounter
Admission: RE | Disposition: A | Discharge status: Home / Self Care | Payer: Self-pay | Source: Home / Self Care | Attending: Gastroenterology

## 2020-08-18 DIAGNOSIS — Z7984 Long term (current) use of oral hypoglycemic drugs: Secondary | ICD-10-CM | POA: Diagnosis not present

## 2020-08-18 DIAGNOSIS — Z888 Allergy status to other drugs, medicaments and biological substances status: Secondary | ICD-10-CM | POA: Diagnosis not present

## 2020-08-18 DIAGNOSIS — Z79899 Other long term (current) drug therapy: Secondary | ICD-10-CM | POA: Diagnosis not present

## 2020-08-18 DIAGNOSIS — D120 Benign neoplasm of cecum: Secondary | ICD-10-CM | POA: Diagnosis not present

## 2020-08-18 DIAGNOSIS — Z1211 Encounter for screening for malignant neoplasm of colon: Secondary | ICD-10-CM | POA: Insufficient documentation

## 2020-08-18 DIAGNOSIS — K635 Polyp of colon: Secondary | ICD-10-CM | POA: Diagnosis not present

## 2020-08-18 DIAGNOSIS — K625 Hemorrhage of anus and rectum: Secondary | ICD-10-CM

## 2020-08-18 DIAGNOSIS — Z800 Family history of malignant neoplasm of digestive organs: Secondary | ICD-10-CM | POA: Insufficient documentation

## 2020-08-18 DIAGNOSIS — K648 Other hemorrhoids: Secondary | ICD-10-CM | POA: Diagnosis not present

## 2020-08-18 DIAGNOSIS — K649 Unspecified hemorrhoids: Secondary | ICD-10-CM | POA: Diagnosis not present

## 2020-08-18 DIAGNOSIS — G4730 Sleep apnea, unspecified: Secondary | ICD-10-CM | POA: Diagnosis not present

## 2020-08-18 DIAGNOSIS — Z96651 Presence of right artificial knee joint: Secondary | ICD-10-CM | POA: Insufficient documentation

## 2020-08-18 DIAGNOSIS — K5730 Diverticulosis of large intestine without perforation or abscess without bleeding: Secondary | ICD-10-CM | POA: Insufficient documentation

## 2020-08-18 HISTORY — PX: COLONOSCOPY WITH PROPOFOL: SHX5780

## 2020-08-18 HISTORY — DX: Hyperlipidemia, unspecified: E78.5

## 2020-08-18 HISTORY — DX: Sleep apnea, unspecified: G47.30

## 2020-08-18 LAB — GLUCOSE, CAPILLARY: Glucose-Capillary: 136 mg/dL — ABNORMAL HIGH (ref 70–99)

## 2020-08-18 SURGERY — COLONOSCOPY WITH PROPOFOL
Anesthesia: General

## 2020-08-18 MED ORDER — PROPOFOL 10 MG/ML IV BOLUS
INTRAVENOUS | Status: DC | PRN
  Administered 2020-08-18: 70 mg via INTRAVENOUS

## 2020-08-18 MED ORDER — PROPOFOL 500 MG/50ML IV EMUL
INTRAVENOUS | Status: DC | PRN
  Administered 2020-08-18: 100 ug/kg/min via INTRAVENOUS

## 2020-08-18 MED ORDER — DEXMEDETOMIDINE (PRECEDEX) IN NS 20 MCG/5ML (4 MCG/ML) IV SYRINGE
PREFILLED_SYRINGE | INTRAVENOUS | Status: DC | PRN
  Administered 2020-08-18: 8 ug via INTRAVENOUS

## 2020-08-18 MED ORDER — SODIUM CHLORIDE 0.9 % IV SOLN: INTRAVENOUS | Status: DC

## 2020-08-18 MED ORDER — EPHEDRINE SULFATE 50 MG/ML IJ SOLN
INTRAMUSCULAR | Status: DC | PRN
  Administered 2020-08-18: 10 mg via INTRAVENOUS
  Administered 2020-08-18: 5 mg via INTRAVENOUS
  Administered 2020-08-18: 10 mg via INTRAVENOUS

## 2020-08-18 NOTE — Anesthesia Postprocedure Evaluation (Signed)
Anesthesia Post Note  Patient: Kelly Farley  Procedure(s) Performed: COLONOSCOPY WITH PROPOFOL  Patient location during evaluation: Endoscopy Anesthesia Type: General Level of consciousness: awake and alert Pain management: pain level controlled Vital Signs Assessment: post-procedure vital signs reviewed and stable Respiratory status: spontaneous breathing, nonlabored ventilation, respiratory function stable and patient connected to nasal cannula oxygen Cardiovascular status: blood pressure returned to baseline and stable Postop Assessment: no apparent nausea or vomiting Anesthetic complications: no   No notable events documented.   Last Vitals:  Vitals:   08/18/20 1010 08/18/20 1020  BP: (!) 93/54 111/73  Pulse: 68 66  Resp: (!) 21 (!) 22  Temp:    SpO2: 100% 100%    Last Pain:  Vitals:   08/18/20 0950  TempSrc: Temporal  PainSc:                  Precious Haws Javares Kaufhold

## 2020-08-18 NOTE — Op Note (Signed)
[facility] Gastroenterology Patient Name: Kelly Farley Procedure Date: 08/18/2020 9:19 AM MRN: [id number] Account #: [id number] Date of Birth: [birth year] Admit Type: Outpatient Age: 65 Room: [facility] ENDO ROOM 4 Gender: Female Note Status: Finalized Procedure:             Colonoscopy Indications:           Screening for colorectal malignant neoplasm Providers:             Francie Keeling B. Bonna Gains MD, MD Referring MD:          Venetia Maxon. Elijio Miles, MD (Referring MD) Medicines:             Monitored Anesthesia Care Complications:         No immediate complications. Procedure:             Pre-Anesthesia Assessment:                        - ASA Grade Assessment: II - A patient with mild                         systemic disease.                        - Prior to the procedure, a History and Physical was                         performed, and patient medications, allergies and                         sensitivities were reviewed. The patient's tolerance                         of previous anesthesia was reviewed.                        - The risks and benefits of the procedure and the                         sedation options and risks were discussed with the                         patient. All questions were answered and informed                         consent was obtained.                        - Patient identification and proposed procedure were                         verified prior to the procedure by the physician, the                         nurse, the anesthesiologist, the anesthetist and the                         technician. The procedure was verified in the                         procedure  room.                        After obtaining informed consent, the colonoscope was                         passed under direct vision. Throughout the procedure,                         the patient's blood pressure, pulse, and oxygen                         saturations were  monitored continuously. The                         Colonoscope was introduced through the anus and                         advanced to the the cecum, identified by appendiceal                         orifice and ileocecal valve. The colonoscopy was                         performed with ease. The patient tolerated the                         procedure well. The quality of the bowel preparation                         was good. Findings:      The perianal and digital rectal examinations were normal.      A 6 mm polyp was found in the cecum. The polyp was sessile. The polyp       was removed with a cold snare. Resection and retrieval were complete.      A 4 mm polyp was found in the sigmoid colon. The polyp was sessile. The       polyp was removed with a cold snare. Resection and retrieval were       complete.      Multiple diverticula were found in the sigmoid colon.      The exam was otherwise without abnormality.      The rectum, sigmoid colon, descending colon, transverse colon, ascending       colon and cecum appeared normal.      Non-bleeding internal hemorrhoids were found during retroflexion. Impression:            - One 6 mm polyp in the cecum, removed with a cold                         snare. Resected and retrieved.                        - One 4 mm polyp in the sigmoid colon, removed with a                         cold snare. Resected and retrieved.                        -  Diverticulosis in the sigmoid colon.                        - The examination was otherwise normal.                        - The rectum, sigmoid colon, descending colon,                         transverse colon, ascending colon and cecum are normal.                        - Non-bleeding internal hemorrhoids. Recommendation:        - Discharge patient to home (with escort).                        - Advance diet as tolerated.                        - Continue present medications.                        -  Await pathology results.                        - Repeat colonoscopy date to be determined after                         pending pathology results are reviewed.                        - The findings and recommendations were discussed with                         the patient.                        - The findings and recommendations were discussed with                         the patient's family.                        - Return to primary care physician as previously                         scheduled.                        - High fiber diet. Procedure Code(s):     --- Professional ---                        [phone], Colonoscopy, flexible; with removal of                         tumor(s), polyp(s), or other lesion(s) by snare                         technique Diagnosis Code(s):     --- Professional ---  Z12.11, Encounter for screening for malignant neoplasm                         of colon                        K63.5, Polyp of colon CPT copyright 2019 American Medical Association. All rights reserved. The codes documented in this report are preliminary and upon coder review may  be revised to meet current compliance requirements.  Vonda Antigua, MD Margretta Sidle B. Bonna Gains MD, MD 08/18/2020 10:05:39 AM This report has been signed electronically. Number of Addenda: 0 Note Initiated On: 08/18/2020 9:19 AM Scope Withdrawal Time: 0 hours 17 minutes 40 seconds  Total Procedure Duration: 0 hours 25 minutes 56 seconds  Estimated Blood Loss:  Estimated blood loss: none.      [facility]

## 2020-08-18 NOTE — Transfer of Care (Signed)
Immediate Anesthesia Transfer of Care Note  Patient: Kelly Farley  Procedure(s) Performed: COLONOSCOPY WITH PROPOFOL  Patient Location: PACU  Anesthesia Type:General  Level of Consciousness: awake, alert  and oriented  Airway & Oxygen Therapy: Patient Spontanous Breathing  Post-op Assessment: Report given to RN and Post -op Vital signs reviewed and stable  Post vital signs: Reviewed and stable  Last Vitals:  Vitals Value Taken Time  BP 89/47 08/18/20 0958  Temp    Pulse 67 08/18/20 0958  Resp 20 08/18/20 0958  SpO2 100 % 08/18/20 0958  Vitals shown include unvalidated device data.  Last Pain:  Vitals:   08/18/20 0858  TempSrc: Temporal  PainSc: 0-No pain         Complications: No notable events documented.

## 2020-08-18 NOTE — Anesthesia Preprocedure Evaluation (Signed)
Anesthesia Evaluation  Patient identified by MRN, date of birth, ID band Patient awake    Reviewed: Allergy & Precautions, NPO status , Patient's Chart, lab work & pertinent test results  History of Anesthesia Complications Negative for: history of anesthetic complications  Airway Mallampati: III  TM Distance: >3 FB Neck ROM: limited    Dental  (+) Chipped   Pulmonary neg shortness of breath, sleep apnea ,    Pulmonary exam normal        Cardiovascular Exercise Tolerance: Good hypertension, (-) angina(-) Past MI and (-) DOE Normal cardiovascular exam     Neuro/Psych negative neurological ROS  negative psych ROS   GI/Hepatic negative GI ROS, Neg liver ROS,   Endo/Other  diabetes, Type 2Morbid obesity  Renal/GU negative Renal ROS  negative genitourinary   Musculoskeletal   Abdominal   Peds  Hematology negative hematology ROS (+)   Anesthesia Other Findings Past Medical History: No date: Arthritis No date: Diabetes mellitus without complication (HCC) No date: Hyperlipemia No date: Hypertension No date: Sleep apnea  Past Surgical History: No date: ABDOMINAL HYSTERECTOMY     Comment:  total No date: EYE SURGERY 2014: JOINT REPLACEMENT     Comment:  right knee  BMI    Body Mass Index: 49.53 kg/m      Reproductive/Obstetrics negative OB ROS                             Anesthesia Physical Anesthesia Plan  ASA: 3  Anesthesia Plan: General   Post-op Pain Management:    Induction: Intravenous  PONV Risk Score and Plan: Propofol infusion and TIVA  Airway Management Planned: Natural Airway and Nasal Cannula  Additional Equipment:   Intra-op Plan:   Post-operative Plan:   Informed Consent: I have reviewed the patients History and Physical, chart, labs and discussed the procedure including the risks, benefits and alternatives for the proposed anesthesia with the patient or  authorized representative who has indicated his/her understanding and acceptance.     Dental Advisory Given  Plan Discussed with: Anesthesiologist, CRNA and Surgeon  Anesthesia Plan Comments: (Patient consented for risks of anesthesia including but not limited to:  - adverse reactions to medications - risk of airway placement if required - damage to eyes, teeth, lips or other oral mucosa - nerve damage due to positioning  - sore throat or hoarseness - Damage to heart, brain, nerves, lungs, other parts of body or loss of life  Patient voiced understanding.)        Anesthesia Quick Evaluation

## 2020-08-18 NOTE — H&P (Signed)
Vonda Antigua, MD [address] [place], [place], [place], [address] [address], [place], [place], [place], [address] Phone: [phone]  Fax: [phone]  Primary Care Physician:  Jodi Marble, MD   Pre-Procedure History & Physical: HPI:  Kelly Farley is a 65 y.o. female is here for a colonoscopy.   Past Medical History:  Diagnosis Date   Arthritis    Diabetes mellitus without complication ([place])    Hyperlipemia    Hypertension    Sleep apnea     Past Surgical History:  Procedure Laterality Date   ABDOMINAL HYSTERECTOMY     total   EYE SURGERY     JOINT REPLACEMENT  2014   right knee    Prior to Admission medications   Medication Sig Start Date End Date Taking? Authorizing Provider  allopurinol (ZYLOPRIM) 100 MG tablet Take 100 mg by mouth daily. 12/21/19  Yes [provider]  amLODipine (NORVASC) 10 MG tablet Take by mouth.   Yes [provider]  dapagliflozin propanediol (FARXIGA) 5 MG TABS tablet Take by mouth daily.   Yes [provider]  famotidine (PEPCID) 40 MG tablet Take 1 tablet (40 mg total) by mouth daily. 08/06/20 09/05/20 Yes Triplett, Cari B, FNP  fexofenadine (ALLEGRA) 180 MG tablet TAKE 1 TABLET BY MOUTH EVERY DAY . IF NOT IMPROVING AFTER 2 WEEKS, INCREASE TO 2 TABLETS DAILY 04/27/20  Yes Ralene Bathe, MD  fexofenadine-pseudoephedrine (ALLEGRA-D) 60-120 MG 12 hr tablet TK 1 T PO  QHS 02/15/15  Yes [provider]  glimepiride (AMARYL) 2 MG tablet Take by mouth. 08/22/16  Yes [provider]  hydrALAZINE (APRESOLINE) 25 MG tablet Take 25 mg by mouth 3 (three) times daily.   Yes [provider]  loteprednol (ALREX) 0.2 % SUSP  02/15/15  Yes [provider]  metoprolol tartrate (LOPRESSOR) 100 MG tablet Take by mouth. 08/22/16  Yes [provider]  mometasone (ELOCON) 0.1 % cream Apply topically. 12/07/19  Yes [provider]  naproxen sodium (ALEVE) 220 MG tablet  Take by mouth. 03/03/14  Yes [provider]  nystatin-triamcinolone ointment (MYCOLOG) Apply 1 Application topically 3 (three) times daily. 07/13/14  Yes [provider]  pravastatin (PRAVACHOL) 40 MG tablet Take by mouth. 02/14/16  Yes [provider]  triamcinolone (KENALOG) 0.1 % Apply 1 application topically 2 (two) times daily. Avoid face, groin, underarms 01/13/20  Yes Ralene Bathe, MD  valsartan-hydrochlorothiazide (DIOVAN-HCT) 320-25 MG tablet Take 1 tablet by mouth daily. 12/17/19  Yes [provider]    Allergies as of 08/10/2020 - Review Complete 08/09/2020  Allergen Reaction Noted   Peanut-containing drug products Swelling and Rash 07/24/2014    Family History  Problem Relation Age of Onset   Colon cancer Mother    Breast cancer Maternal Aunt     Social History   Socioeconomic History   Marital status: Single    Spouse name: Not on file   Number of children: Not on file   Years of education: Not on file   Highest education level: Not on file  Occupational History   Not on file  Tobacco Use   Smoking status: Never   Smokeless tobacco: Not on file  Substance and Sexual Activity   Alcohol use: No   Drug use: Not on file   Sexual activity: Not on file  Other Topics Concern   Not on file  Social History Narrative   Not on file   Social Determinants of  Health   Financial Resource Strain: Not on file  Food Insecurity: Not on file  Transportation Needs: Not on file  Physical Activity: Not on file  Stress: Not on file  Social Connections: Not on file  Intimate Partner Violence: Not on file    Review of Systems: See HPI, otherwise negative ROS  Physical Exam: Constitutional: General:   Alert,  Well-developed, well-nourished, pleasant and cooperative in NAD BP (!) 150/72   Pulse 70   Temp (!) 95.6 F (35.3 C) (Temporal)   Resp 18   Ht 5' 7.5" (1.715 m)   Wt (!) 145.6 kg   SpO2 97%   BMI 49.53 kg/m   Head:  Normocephalic, atraumatic.   Eyes:  Sclera clear, no icterus.   Conjunctiva pink.   Mouth:  No deformity or lesions, oropharynx pink & moist.  Neck:  Supple, trachea midline  Respiratory: Normal respiratory effort  Gastrointestinal:  Soft, non-tender and non-distended without masses, hepatosplenomegaly or hernias noted.  No guarding or rebound tenderness.     Cardiac: No clubbing or edema.  No cyanosis. Normal posterior tibial pedal pulses noted.  Lymphatic:  No significant cervical adenopathy.  Psych:  Alert and cooperative. Normal mood and affect.  Musculoskeletal:   Symmetrical without gross deformities. 5/5 Lower extremity strength bilaterally.  Skin: Warm. Intact without significant lesions or rashes. No jaundice.  Neurologic:  Face symmetrical, tongue midline, Normal sensation to touch;  grossly normal neurologically.  Psych:  Alert and oriented x3, Alert and cooperative. Normal mood and affect.  Impression/Plan: Kelly Farley is here for a colonoscopy to be performed for family history of colon cancer  Risks, benefits, limitations, and alternatives regarding  colonoscopy have been reviewed with the patient.  Questions have been answered.  All parties agreeable.   Virgel Manifold, MD  08/18/2020, 9:04 AM

## 2020-08-19 ENCOUNTER — Encounter: Payer: Self-pay | Admitting: Gastroenterology

## 2020-08-19 LAB — SURGICAL PATHOLOGY

## 2020-08-24 ENCOUNTER — Encounter: Payer: Self-pay | Admitting: Gastroenterology

## 2020-09-28 ENCOUNTER — Other Ambulatory Visit: Payer: Self-pay

## 2020-09-28 MED ORDER — FAMOTIDINE 20 MG PO TABS: 20.0000 mg | ORAL_TABLET | Freq: Every day | ORAL | 0 refills | Status: DC

## 2020-09-28 NOTE — Telephone Encounter (Signed)
Last filled from ED... I did not see mention from 08/09/20 OV... please advise if pt is to continue Rx... pt has f/u 11/2020.Marland KitchenMarland KitchenMarland Kitchen

## 2020-10-01 DIAGNOSIS — G4733 Obstructive sleep apnea (adult) (pediatric): Secondary | ICD-10-CM | POA: Diagnosis not present

## 2020-10-06 DIAGNOSIS — M109 Gout, unspecified: Secondary | ICD-10-CM | POA: Diagnosis not present

## 2020-10-06 DIAGNOSIS — I10 Essential (primary) hypertension: Secondary | ICD-10-CM | POA: Diagnosis not present

## 2020-10-06 DIAGNOSIS — E119 Type 2 diabetes mellitus without complications: Secondary | ICD-10-CM | POA: Diagnosis not present

## 2020-10-19 ENCOUNTER — Ambulatory Visit: Payer: Medicare Other | Admitting: Gastroenterology

## 2020-10-19 ENCOUNTER — Other Ambulatory Visit: Payer: Self-pay | Admitting: Internal Medicine

## 2020-10-19 DIAGNOSIS — N189 Chronic kidney disease, unspecified: Secondary | ICD-10-CM | POA: Diagnosis not present

## 2020-10-19 DIAGNOSIS — E785 Hyperlipidemia, unspecified: Secondary | ICD-10-CM | POA: Diagnosis not present

## 2020-10-19 DIAGNOSIS — N644 Mastodynia: Secondary | ICD-10-CM

## 2020-10-19 DIAGNOSIS — I781 Nevus, non-neoplastic: Secondary | ICD-10-CM | POA: Diagnosis not present

## 2020-10-19 DIAGNOSIS — G4733 Obstructive sleep apnea (adult) (pediatric): Secondary | ICD-10-CM | POA: Diagnosis not present

## 2020-10-19 DIAGNOSIS — E119 Type 2 diabetes mellitus without complications: Secondary | ICD-10-CM | POA: Diagnosis not present

## 2020-10-28 ENCOUNTER — Other Ambulatory Visit: Payer: Self-pay | Admitting: Internal Medicine

## 2020-10-28 DIAGNOSIS — N644 Mastodynia: Secondary | ICD-10-CM

## 2020-11-09 ENCOUNTER — Ambulatory Visit: Payer: Medicare Other | Admitting: Gastroenterology

## 2020-11-11 ENCOUNTER — Other Ambulatory Visit: Payer: Medicare Other

## 2020-11-15 ENCOUNTER — Ambulatory Visit
Admission: RE | Admit: 2020-11-15 | Discharge: 2020-11-15 | Disposition: A | Discharge status: Home / Self Care | Payer: Medicare Other | Source: Ambulatory Visit | Attending: Internal Medicine | Admitting: Internal Medicine

## 2020-11-15 ENCOUNTER — Other Ambulatory Visit: Payer: Self-pay

## 2020-11-15 DIAGNOSIS — N644 Mastodynia: Secondary | ICD-10-CM | POA: Insufficient documentation

## 2020-11-15 DIAGNOSIS — R922 Inconclusive mammogram: Secondary | ICD-10-CM | POA: Diagnosis not present

## 2020-12-14 DIAGNOSIS — N189 Chronic kidney disease, unspecified: Secondary | ICD-10-CM | POA: Diagnosis not present

## 2020-12-14 DIAGNOSIS — E119 Type 2 diabetes mellitus without complications: Secondary | ICD-10-CM | POA: Diagnosis not present

## 2020-12-14 DIAGNOSIS — I781 Nevus, non-neoplastic: Secondary | ICD-10-CM | POA: Diagnosis not present

## 2020-12-14 DIAGNOSIS — G4733 Obstructive sleep apnea (adult) (pediatric): Secondary | ICD-10-CM | POA: Diagnosis not present

## 2020-12-14 DIAGNOSIS — E785 Hyperlipidemia, unspecified: Secondary | ICD-10-CM | POA: Diagnosis not present

## 2020-12-14 DIAGNOSIS — Z0001 Encounter for general adult medical examination with abnormal findings: Secondary | ICD-10-CM | POA: Diagnosis not present

## 2020-12-14 DIAGNOSIS — M109 Gout, unspecified: Secondary | ICD-10-CM | POA: Diagnosis not present

## 2021-01-04 ENCOUNTER — Ambulatory Visit (INDEPENDENT_AMBULATORY_CARE_PROVIDER_SITE_OTHER): Payer: Medicare Other | Admitting: Gastroenterology

## 2021-01-04 ENCOUNTER — Encounter: Payer: Self-pay | Admitting: Gastroenterology

## 2021-01-04 VITALS — BP 145/63 | HR 57 | Temp 98.3°F | Wt 335.0 lb

## 2021-01-04 DIAGNOSIS — G4733 Obstructive sleep apnea (adult) (pediatric): Secondary | ICD-10-CM | POA: Diagnosis not present

## 2021-01-04 DIAGNOSIS — K5900 Constipation, unspecified: Secondary | ICD-10-CM

## 2021-01-04 NOTE — Progress Notes (Signed)
Vonda Antigua, MD [address]  [place]  [place], [place] [address]  Main: [phone]  Fax: [phone]   Primary Care Physician: Jodi Marble, MD   Chief Complaint  Patient presents with   Follow-up    Pt denies any new concerns and reports having a BM at least once daily    HPI: Kelly Farley is a 65 y.o. female here for follow-up of intermittent bright red blood per rectum.  Patient has undergone colonoscopy which showed internal hemorrhoids and 2 subcentimeter polyps that were removed.  Patient denies any further bright red blood per rectum since her colonoscopy.  No abdominal pain.  Reports having 1 bowel movement daily that is soft.  She does state that she feels she may be getting a little constipated as she does sit on the toilet for a while until she feels that she has evacuated completely, but does not strain or push.  Is not taking any laxatives.  No dysphagia, nausea or vomiting.  No weight loss.   ROS: All ROS reviewed and negative except as per HPI   Past Medical History:  Diagnosis Date   Arthritis    Diabetes mellitus without complication ([place])    Hyperlipemia    Hypertension    Sleep apnea     Past Surgical History:  Procedure Laterality Date   ABDOMINAL HYSTERECTOMY     total   COLONOSCOPY WITH PROPOFOL N/A 08/18/2020   Procedure: COLONOSCOPY WITH PROPOFOL;  Surgeon: Virgel Manifold, MD;  Location: [facility] ENDOSCOPY;  Service: Endoscopy;  Laterality: N/A;   EYE SURGERY     JOINT REPLACEMENT  2014   right knee    Prior to Admission medications   Medication Sig Start Date End Date Taking? Authorizing Provider  allopurinol (ZYLOPRIM) 100 MG tablet Take 100 mg by mouth daily. 12/21/19  Yes [provider]  amLODipine (NORVASC) 10 MG tablet Take by mouth.   Yes [provider]  famotidine (PEPCID) 20 MG tablet Take 1 tablet (20 mg total) by mouth daily. 09/28/20  Yes Vonda Antigua B, MD  fexofenadine (ALLEGRA)  180 MG tablet TAKE 1 TABLET BY MOUTH EVERY DAY . IF NOT IMPROVING AFTER 2 WEEKS, INCREASE TO 2 TABLETS DAILY 04/27/20  Yes Ralene Bathe, MD  fexofenadine-pseudoephedrine (ALLEGRA-D) 60-120 MG 12 hr tablet TK 1 T PO  QHS 02/15/15  Yes [provider]  glimepiride (AMARYL) 2 MG tablet Take by mouth. 08/22/16  Yes [provider]  hydrALAZINE (APRESOLINE) 25 MG tablet Take 25 mg by mouth 3 (three) times daily.   Yes [provider]  loteprednol (ALREX) 0.2 % SUSP  02/15/15  Yes [provider]  metoprolol tartrate (LOPRESSOR) 100 MG tablet Take by mouth. 08/22/16  Yes [provider]  mometasone (ELOCON) 0.1 % cream Apply topically. 12/07/19  Yes [provider]  naproxen sodium (ALEVE) 220 MG tablet Take by mouth. 03/03/14  Yes [provider]  nystatin-triamcinolone ointment (MYCOLOG) Apply 1 Application topically 3 (three) times daily. 07/13/14  Yes [provider]  pravastatin (PRAVACHOL) 40 MG tablet Take by mouth. 02/14/16  Yes [provider]  triamcinolone (KENALOG) 0.1 % Apply 1 application topically 2 (two) times daily. Avoid face, groin, underarms 01/13/20  Yes Ralene Bathe, MD  valsartan-hydrochlorothiazide (DIOVAN-HCT) 320-25 MG tablet Take 1 tablet by mouth daily. 12/17/19  Yes [provider]    Family History  Problem Relation Age of Onset   Colon cancer Mother  Breast cancer Maternal Aunt      Social History   Tobacco Use   Smoking status: Never  Substance Use Topics   Alcohol use: No    Allergies as of 01/04/2021 - Review Complete 01/04/2021  Allergen Reaction Noted   Peanut-containing drug products Swelling and Rash 07/24/2014    Physical Examination:  Constitutional: General:   Alert,  Well-developed, well-nourished, pleasant and cooperative in NAD BP (!) 145/63    Pulse (!) 57    Temp 98.3 F (36.8 C) (Oral)    Wt (!) 335 lb (152 kg)    BMI 51.69 kg/m   Respiratory: Normal  respiratory effort  Gastrointestinal:  Soft, non-tender and non-distended without masses, hepatosplenomegaly or hernias noted.  No guarding or rebound tenderness.     Cardiac: No clubbing or edema.  No cyanosis. Normal posterior tibial pedal pulses noted.  Psych:  Alert and cooperative. Normal mood and affect.  Musculoskeletal:  Normal gait. Head normocephalic, atraumatic. Symmetrical without gross deformities. 5/5 Lower extremity strength bilaterally.  Skin: Warm. Intact without significant lesions or rashes. No jaundice.  Neck: Supple, trachea midline  Lymph: No cervical lymphadenopathy  Psych:  Alert and oriented x3, Alert and cooperative. Normal mood and affect.  Labs: CMP     Component Value Date/Time   NA 138 08/06/2020 1254   K 3.6 08/06/2020 1254   CL 101 08/06/2020 1254   CO2 26 08/06/2020 1254   GLUCOSE 165 (H) 08/06/2020 1254   BUN 18 08/06/2020 1254   CREATININE 1.39 (H) 08/06/2020 1254   CALCIUM 9.3 08/06/2020 1254   PROT 7.5 08/06/2020 1254   ALBUMIN 3.9 08/06/2020 1254   AST 36 08/06/2020 1254   ALT 19 08/06/2020 1254   ALKPHOS 57 08/06/2020 1254   BILITOT 0.7 08/06/2020 1254   GFRNONAA 42 (L) 08/06/2020 1254   Lab Results  Component Value Date   WBC 4.6 08/06/2020   HGB 13.8 08/06/2020   HCT 40.6 08/06/2020   MCV 90.6 08/06/2020   PLT 327 08/06/2020    Imaging Studies:   Assessment and Plan:   Kelly Farley is a 65 y.o. y/o female here for follow-up of intermittent bright red blood per rectum that has resolved and patient was found to have internal hemorrhoids and colonoscopy  High-fiber diet recommended  She is having 1 bowel movement a day, but does have to sit on the toilet for a while before she feels that she has evacuated completely.  She was advised to start taking a fiber supplement daily or MiraLAX daily.  If in 2 to 3 weeks, she does not see improvement in her constipation, she was advised to call us and we may need to start her on  pharmacologic therapy.  Currently since she is having 1 bowel movement every single day, pharmacologic therapy such as Linzess, Trulance or Amitiza is likely to lead to diarrhea, therefore we will hold off until she increases her fiber intake or uses MiraLAX to assess for improvement in symptoms    Dr Vonda Antigua

## 2021-01-13 DIAGNOSIS — E119 Type 2 diabetes mellitus without complications: Secondary | ICD-10-CM | POA: Diagnosis not present

## 2021-01-13 DIAGNOSIS — N189 Chronic kidney disease, unspecified: Secondary | ICD-10-CM | POA: Diagnosis not present

## 2021-01-18 DIAGNOSIS — M109 Gout, unspecified: Secondary | ICD-10-CM | POA: Diagnosis not present

## 2021-01-18 DIAGNOSIS — E785 Hyperlipidemia, unspecified: Secondary | ICD-10-CM | POA: Diagnosis not present

## 2021-01-18 DIAGNOSIS — G4733 Obstructive sleep apnea (adult) (pediatric): Secondary | ICD-10-CM | POA: Diagnosis not present

## 2021-01-18 DIAGNOSIS — I781 Nevus, non-neoplastic: Secondary | ICD-10-CM | POA: Diagnosis not present

## 2021-01-18 DIAGNOSIS — I10 Essential (primary) hypertension: Secondary | ICD-10-CM | POA: Diagnosis not present

## 2021-01-18 DIAGNOSIS — E119 Type 2 diabetes mellitus without complications: Secondary | ICD-10-CM | POA: Diagnosis not present

## 2021-01-18 DIAGNOSIS — N189 Chronic kidney disease, unspecified: Secondary | ICD-10-CM | POA: Diagnosis not present

## 2021-01-18 DIAGNOSIS — E668 Other obesity: Secondary | ICD-10-CM | POA: Diagnosis not present

## 2021-02-12 DIAGNOSIS — T2102XA Burn of unspecified degree of abdominal wall, initial encounter: Secondary | ICD-10-CM | POA: Diagnosis not present

## 2021-02-12 DIAGNOSIS — Z7189 Other specified counseling: Secondary | ICD-10-CM | POA: Diagnosis not present

## 2021-02-24 DIAGNOSIS — M25562 Pain in left knee: Secondary | ICD-10-CM | POA: Diagnosis not present

## 2021-02-24 DIAGNOSIS — M1712 Unilateral primary osteoarthritis, left knee: Secondary | ICD-10-CM | POA: Diagnosis not present

## 2021-04-25 DIAGNOSIS — N189 Chronic kidney disease, unspecified: Secondary | ICD-10-CM | POA: Diagnosis not present

## 2021-04-25 DIAGNOSIS — E119 Type 2 diabetes mellitus without complications: Secondary | ICD-10-CM | POA: Diagnosis not present

## 2021-05-01 DIAGNOSIS — G4733 Obstructive sleep apnea (adult) (pediatric): Secondary | ICD-10-CM | POA: Diagnosis not present

## 2021-05-17 ENCOUNTER — Ambulatory Visit
Admission: RE | Admit: 2021-05-17 | Discharge: 2021-05-17 | Disposition: A | Discharge status: Home / Self Care | Payer: HMO | Source: Ambulatory Visit | Attending: Sports Medicine | Admitting: Sports Medicine

## 2021-05-17 ENCOUNTER — Other Ambulatory Visit: Payer: Self-pay | Admitting: Sports Medicine

## 2021-05-17 DIAGNOSIS — M545 Low back pain, unspecified: Secondary | ICD-10-CM | POA: Diagnosis not present

## 2021-05-17 DIAGNOSIS — R202 Paresthesia of skin: Secondary | ICD-10-CM | POA: Diagnosis not present

## 2021-05-17 DIAGNOSIS — M4316 Spondylolisthesis, lumbar region: Secondary | ICD-10-CM | POA: Diagnosis not present

## 2021-05-17 DIAGNOSIS — R200 Anesthesia of skin: Secondary | ICD-10-CM | POA: Diagnosis not present

## 2021-05-22 DIAGNOSIS — M170 Bilateral primary osteoarthritis of knee: Secondary | ICD-10-CM | POA: Diagnosis not present

## 2021-05-25 ENCOUNTER — Other Ambulatory Visit: Payer: Self-pay | Admitting: Sports Medicine

## 2021-05-25 DIAGNOSIS — M545 Low back pain, unspecified: Secondary | ICD-10-CM

## 2021-05-30 DIAGNOSIS — N189 Chronic kidney disease, unspecified: Secondary | ICD-10-CM | POA: Diagnosis not present

## 2021-05-30 DIAGNOSIS — M5431 Sciatica, right side: Secondary | ICD-10-CM | POA: Diagnosis not present

## 2021-05-30 DIAGNOSIS — I781 Nevus, non-neoplastic: Secondary | ICD-10-CM | POA: Diagnosis not present

## 2021-05-30 DIAGNOSIS — G4733 Obstructive sleep apnea (adult) (pediatric): Secondary | ICD-10-CM | POA: Diagnosis not present

## 2021-05-30 DIAGNOSIS — E119 Type 2 diabetes mellitus without complications: Secondary | ICD-10-CM | POA: Diagnosis not present

## 2021-05-30 DIAGNOSIS — E785 Hyperlipidemia, unspecified: Secondary | ICD-10-CM | POA: Diagnosis not present

## 2021-05-30 DIAGNOSIS — E668 Other obesity: Secondary | ICD-10-CM | POA: Diagnosis not present

## 2021-05-30 DIAGNOSIS — M109 Gout, unspecified: Secondary | ICD-10-CM | POA: Diagnosis not present

## 2021-05-30 DIAGNOSIS — I10 Essential (primary) hypertension: Secondary | ICD-10-CM | POA: Diagnosis not present

## 2021-06-02 ENCOUNTER — Ambulatory Visit
Admission: RE | Admit: 2021-06-02 | Discharge: 2021-06-02 | Disposition: A | Discharge status: Home / Self Care | Payer: HMO | Source: Ambulatory Visit | Attending: Sports Medicine | Admitting: Sports Medicine

## 2021-06-02 DIAGNOSIS — M48061 Spinal stenosis, lumbar region without neurogenic claudication: Secondary | ICD-10-CM | POA: Diagnosis not present

## 2021-06-02 DIAGNOSIS — M545 Low back pain, unspecified: Secondary | ICD-10-CM

## 2021-06-07 DIAGNOSIS — N189 Chronic kidney disease, unspecified: Secondary | ICD-10-CM | POA: Diagnosis not present

## 2021-07-05 ENCOUNTER — Other Ambulatory Visit: Payer: Self-pay | Admitting: Nephrology

## 2021-07-05 DIAGNOSIS — N1832 Chronic kidney disease, stage 3b: Secondary | ICD-10-CM | POA: Diagnosis not present

## 2021-07-05 DIAGNOSIS — R82998 Other abnormal findings in urine: Secondary | ICD-10-CM | POA: Diagnosis not present

## 2021-07-05 DIAGNOSIS — G4733 Obstructive sleep apnea (adult) (pediatric): Secondary | ICD-10-CM | POA: Diagnosis not present

## 2021-07-05 DIAGNOSIS — E668 Other obesity: Secondary | ICD-10-CM | POA: Diagnosis not present

## 2021-07-05 DIAGNOSIS — I10 Essential (primary) hypertension: Secondary | ICD-10-CM | POA: Diagnosis not present

## 2021-07-05 DIAGNOSIS — E785 Hyperlipidemia, unspecified: Secondary | ICD-10-CM | POA: Diagnosis not present

## 2021-07-05 DIAGNOSIS — E1122 Type 2 diabetes mellitus with diabetic chronic kidney disease: Secondary | ICD-10-CM

## 2021-07-05 DIAGNOSIS — M1000 Idiopathic gout, unspecified site: Secondary | ICD-10-CM | POA: Diagnosis not present

## 2021-07-05 HISTORY — DX: Other abnormal findings in urine: R82.998

## 2021-07-19 ENCOUNTER — Ambulatory Visit: Payer: Self-pay

## 2021-07-19 ENCOUNTER — Ambulatory Visit (INDEPENDENT_AMBULATORY_CARE_PROVIDER_SITE_OTHER): Payer: HMO | Admitting: Physical Medicine and Rehabilitation

## 2021-07-19 ENCOUNTER — Encounter: Payer: Self-pay | Admitting: Physical Medicine and Rehabilitation

## 2021-07-19 DIAGNOSIS — M5416 Radiculopathy, lumbar region: Secondary | ICD-10-CM | POA: Diagnosis not present

## 2021-07-19 DIAGNOSIS — M5116 Intervertebral disc disorders with radiculopathy, lumbar region: Secondary | ICD-10-CM

## 2021-07-19 MED ORDER — METHYLPREDNISOLONE ACETATE 80 MG/ML IJ SUSP
80.0000 mg | Freq: Once | INTRAMUSCULAR | Status: AC
  Administered 2021-07-19: 80 mg

## 2021-07-19 NOTE — Progress Notes (Signed)
Pt state lower back pain that travels down both leg and foot. Pt state walking, laying down and standing makes the pain worse. Pt state takes she over the counter pain meds to help ease her pain.  Numeric Pain Rating Scale and Functional Assessment Average Pain 2   In the last MONTH (on 0-10 scale) has pain interfered with the following?  1. General activity like being  able to carry out your everyday physical activities such as walking, climbing stairs, carrying groceries, or moving a chair?  Rating(7)   +Driver, -BT, -Dye Allergies.

## 2021-07-19 NOTE — Patient Instructions (Signed)

## 2021-07-21 ENCOUNTER — Ambulatory Visit
Admission: RE | Admit: 2021-07-21 | Discharge: 2021-07-21 | Disposition: A | Discharge status: Home / Self Care | Payer: HMO | Source: Ambulatory Visit | Attending: Nephrology | Admitting: Nephrology

## 2021-07-21 DIAGNOSIS — E1122 Type 2 diabetes mellitus with diabetic chronic kidney disease: Secondary | ICD-10-CM | POA: Insufficient documentation

## 2021-07-21 DIAGNOSIS — N1832 Chronic kidney disease, stage 3b: Secondary | ICD-10-CM | POA: Insufficient documentation

## 2021-07-21 DIAGNOSIS — R82998 Other abnormal findings in urine: Secondary | ICD-10-CM | POA: Insufficient documentation

## 2021-07-21 DIAGNOSIS — N189 Chronic kidney disease, unspecified: Secondary | ICD-10-CM | POA: Diagnosis not present

## 2021-07-21 DIAGNOSIS — N281 Cyst of kidney, acquired: Secondary | ICD-10-CM | POA: Diagnosis not present

## 2021-07-21 DIAGNOSIS — N3289 Other specified disorders of bladder: Secondary | ICD-10-CM | POA: Diagnosis not present

## 2021-08-03 NOTE — Procedures (Signed)
Lumbar Epidural Steroid Injection - Interlaminar Approach with Fluoroscopic Guidance  Patient: Kelly Farley      Date of Birth: [birth year] MRN: [id number] PCP: Jodi Marble, MD      Visit Date: 07/19/2021   Universal Protocol:     Consent Given By: the patient  Position: PRONE  Additional Comments: Vital signs were monitored before and after the procedure. Patient was prepped and draped in the usual sterile fashion. The correct patient, procedure, and site was verified.   Injection Procedure Details:   Procedure diagnoses: Lumbar radiculopathy [M54.16]   Meds Administered:  Meds ordered this encounter  Medications   methylPREDNISolone acetate (DEPO-MEDROL) injection 80 mg     Laterality: Right  Location/Site:  L5-S1  Needle: 3.5 in., 20 ga. Tuohy  Needle Placement: Paramedian epidural  Findings:   -Comments: Excellent flow of contrast into the epidural space.  Procedure Details: Using a paramedian approach from the side mentioned above, the region overlying the inferior lamina was localized under fluoroscopic visualization and the soft tissues overlying this structure were infiltrated with 4 ml. of 1% Lidocaine without Epinephrine. The Tuohy needle was inserted into the epidural space using a paramedian approach.   The epidural space was localized using loss of resistance along with counter oblique bi-planar fluoroscopic views.  After negative aspirate for air, blood, and CSF, a 2 ml. volume of Isovue-250 was injected into the epidural space and the flow of contrast was observed. Radiographs were obtained for documentation purposes.    The injectate was administered into the level noted above.   Additional Comments:  The patient tolerated the procedure well Dressing: 2 x 2 sterile gauze and Band-Aid    Post-procedure details: Patient was observed during the procedure. Post-procedure instructions were reviewed.  Patient left the clinic in stable  condition.

## 2021-08-03 NOTE — Progress Notes (Signed)
Kelly Farley - 66 y.o. female MRN [id number]  Date of birth: 1955-10-08  Office Visit Note: Visit Date: 07/19/2021 PCP: Jodi Marble, MD Referred by: Gerda Diss, DO  Subjective: Chief Complaint  Patient presents with   Lower Back - Pain   Right Leg - Pain   Left Leg - Pain   Left Foot - Pain   Right Foot - Pain   HPI:  Kelly Farley is a 66 y.o. female who comes in today at the request of Dr. Teresa Coombs for planned Right L5-S1 Lumbar Interlaminar epidural steroid injection with fluoroscopic guidance.  The patient has failed conservative care including home exercise, medications, time and activity modification.  This injection will be diagnostic and hopefully therapeutic.  Please see requesting physician notes for further details and justification. MRI reviewed with images and spine model.  MRI reviewed in the note below.    ROS Otherwise per HPI.  Assessment & Plan: Visit Diagnoses:    ICD-10-CM   1. Lumbar radiculopathy  M54.16 XR C-ARM NO REPORT    Epidural Steroid injection    methylPREDNISolone acetate (DEPO-MEDROL) injection 80 mg    2. Radiculopathy due to lumbar intervertebral disc disorder  M51.16       Plan: No additional findings.   Meds & Orders:  Meds ordered this encounter  Medications   methylPREDNISolone acetate (DEPO-MEDROL) injection 80 mg    Orders Placed This Encounter  Procedures   XR C-ARM NO REPORT   Epidural Steroid injection    Follow-up: Return for visit to requesting provider as needed.   Procedures: No procedures performed  Lumbar Epidural Steroid Injection - Interlaminar Approach with Fluoroscopic Guidance  Patient: Kelly Farley      Date of Birth: 1955/06/05 MRN: [id number] PCP: Jodi Marble, MD      Visit Date: 07/19/2021   Universal Protocol:     Consent Given By: the patient  Position: PRONE  Additional Comments: Vital signs were monitored before and after the procedure. Patient was prepped and  draped in the usual sterile fashion. The correct patient, procedure, and site was verified.   Injection Procedure Details:   Procedure diagnoses: Lumbar radiculopathy [M54.16]   Meds Administered:  Meds ordered this encounter  Medications   methylPREDNISolone acetate (DEPO-MEDROL) injection 80 mg     Laterality: Right  Location/Site:  L5-S1  Needle: 3.5 in., 20 ga. Tuohy  Needle Placement: Paramedian epidural  Findings:   -Comments: Excellent flow of contrast into the epidural space.  Procedure Details: Using a paramedian approach from the side mentioned above, the region overlying the inferior lamina was localized under fluoroscopic visualization and the soft tissues overlying this structure were infiltrated with 4 ml. of 1% Lidocaine without Epinephrine. The Tuohy needle was inserted into the epidural space using a paramedian approach.   The epidural space was localized using loss of resistance along with counter oblique bi-planar fluoroscopic views.  After negative aspirate for air, blood, and CSF, a 2 ml. volume of Isovue-250 was injected into the epidural space and the flow of contrast was observed. Radiographs were obtained for documentation purposes.    The injectate was administered into the level noted above.   Additional Comments:  The patient tolerated the procedure well Dressing: 2 x 2 sterile gauze and Band-Aid    Post-procedure details: Patient was observed during the procedure. Post-procedure instructions were reviewed.  Patient left the clinic in stable condition.   Clinical History: MRI LUMBAR SPINE WITHOUT CONTRAST   TECHNIQUE:  Multiplanar, multisequence MR imaging of the lumbar spine was performed. No intravenous contrast was administered.   COMPARISON:  No prior MRI, correlation is made with lumbar spine radiographs 05/17/2021   FINDINGS: Segmentation:  Standard.   Alignment: No listhesis. The listhesis seen on the radiographs is not  apparent on this exam.   Vertebrae:  No acute fracture or suspicious osseous lesion.   Conus medullaris and cauda equina: Conus extends to the T12-L1 level. Conus and cauda equina appear normal.   Paraspinal and other soft tissues: Renal cysts.   Disc levels:   T12-L1: No significant disc bulge. No spinal canal stenosis or neural foraminal narrowing.   L1-L2: No significant disc bulge. Mild facet arthropathy. No spinal canal stenosis or neural foraminal narrowing.   L2-L3: Minimal disc bulge. Mild facet arthropathy. No spinal canal stenosis or neural foraminal narrowing.   L3-L4: Mild disc bulge. Mild-to-moderate facet arthropathy. No spinal canal stenosis. Mild bilateral neural foraminal narrowing.   L4-L5: Mild disc bulge. Moderate to severe facet arthropathy. Widening of the right facets, which can indicate instability. Narrowing of the lateral recesses. Mild spinal canal stenosis. Mild right neural foraminal narrowing.   L5-S1: Disc bulge with superimposed right paracentral and subarticular disc extrusion with 2.1 cm of cranial migration. This contacts and displaces the right S1 nerve roots. Moderate facet arthropathy. No spinal canal stenosis or neural foraminal narrowing.   IMPRESSION: 1. L5-S1 right paracentral and subarticular disc extrusion with cranial migration, which contacts and displaces the right S1 nerve roots. 2. L4-L5 mild spinal canal stenosis and mild right neural foraminal narrowing. Moderate to severe facet arthropathy with widening of the right facets, which can indicate instability. 3. L3-L4 mild bilateral neural foraminal narrowing.     Electronically Signed   By: Merilyn Baba M.D.   On: 06/03/2021 04:20     Objective:  VS:  HT:    WT:   BMI:     BP:   HR: bpm  TEMP: ( )  RESP:  Physical Exam Vitals and nursing note reviewed.  Constitutional:      General: She is not in acute distress.    Appearance: Normal appearance. She is  obese. She is not ill-appearing.  HENT:     Head: Normocephalic and atraumatic.     Right Ear: External ear normal.     Left Ear: External ear normal.  Eyes:     Extraocular Movements: Extraocular movements intact.  Cardiovascular:     Rate and Rhythm: Normal rate.     Pulses: Normal pulses.  Pulmonary:     Effort: Pulmonary effort is normal. No respiratory distress.  Abdominal:     General: There is no distension.     Palpations: Abdomen is soft.  Musculoskeletal:        General: Tenderness present.     Cervical back: Neck supple.     Right lower leg: No edema.     Left lower leg: No edema.     Comments: Patient has good distal strength with no pain over the greater trochanters.  No clonus or focal weakness.  Skin:    Findings: No erythema, lesion or rash.  Neurological:     General: No focal deficit present.     Mental Status: She is alert and oriented to person, place, and time.     Sensory: No sensory deficit.     Motor: No weakness or abnormal muscle tone.     Coordination: Coordination normal.  Psychiatric:  Mood and Affect: Mood normal.        Behavior: Behavior normal.      Imaging: No results found.

## 2021-08-15 DIAGNOSIS — M545 Low back pain, unspecified: Secondary | ICD-10-CM | POA: Diagnosis not present

## 2021-08-15 DIAGNOSIS — R262 Difficulty in walking, not elsewhere classified: Secondary | ICD-10-CM | POA: Diagnosis not present

## 2021-08-15 DIAGNOSIS — M4726 Other spondylosis with radiculopathy, lumbar region: Secondary | ICD-10-CM | POA: Diagnosis not present

## 2021-08-15 DIAGNOSIS — M25562 Pain in left knee: Secondary | ICD-10-CM | POA: Diagnosis not present

## 2021-08-15 DIAGNOSIS — M1712 Unilateral primary osteoarthritis, left knee: Secondary | ICD-10-CM | POA: Diagnosis not present

## 2021-08-17 ENCOUNTER — Ambulatory Visit: Payer: HMO | Admitting: Podiatry

## 2021-08-17 DIAGNOSIS — R200 Anesthesia of skin: Secondary | ICD-10-CM | POA: Diagnosis not present

## 2021-08-17 DIAGNOSIS — Z8739 Personal history of other diseases of the musculoskeletal system and connective tissue: Secondary | ICD-10-CM

## 2021-08-17 DIAGNOSIS — R202 Paresthesia of skin: Secondary | ICD-10-CM

## 2021-08-17 NOTE — Progress Notes (Signed)
Subjective:  Patient ID: Kelly Farley, female    DOB: 1955/04/28,  MRN: [id number]  Chief Complaint  Patient presents with   Foot Pain    Numbness     66 y.o. female presents with the above complaint.  Patient presents with complaint of numbness to the right lower extremity.  Patient is mostly in the toes and in the heel area.  Patient states that this feels weird and slight is numbness tingling to the foot.  She does not have any focal achiness.  She has not seen anyone else prior to seeing me.  She is a type II diabetic with last A1c of like 5.6.  She also states that she is got arthritis in the back.  She denies any other acute complaints she has not seen a spine specialist.   Review of Systems: Negative except as noted in the HPI. Denies N/V/F/Ch.  Past Medical History:  Diagnosis Date   Arthritis    Diabetes mellitus without complication ([place])    Hyperlipemia    Hypertension    Sleep apnea     Current Outpatient Medications:    allopurinol (ZYLOPRIM) 100 MG tablet, Take 100 mg by mouth daily., Disp: , Rfl:    amLODipine (NORVASC) 10 MG tablet, Take by mouth., Disp: , Rfl:    famotidine (PEPCID) 20 MG tablet, Take 1 tablet (20 mg total) by mouth daily., Disp: 30 tablet, Rfl: 0   glimepiride (AMARYL) 2 MG tablet, Take by mouth., Disp: , Rfl:    hydrALAZINE (APRESOLINE) 25 MG tablet, Take 25 mg by mouth 3 (three) times daily., Disp: , Rfl:    metoprolol tartrate (LOPRESSOR) 100 MG tablet, Take by mouth., Disp: , Rfl:    pravastatin (PRAVACHOL) 40 MG tablet, Take by mouth., Disp: , Rfl:    valsartan-hydrochlorothiazide (DIOVAN-HCT) 320-25 MG tablet, Take 1 tablet by mouth daily., Disp: , Rfl:   Social History   Tobacco Use  Smoking Status Never  Smokeless Tobacco Not on file    Allergies  Allergen Reactions   Peanut-Containing Drug Products Swelling and Rash   Objective:  There were no vitals filed for this visit. There is no height or weight on file to calculate  BMI. Constitutional Well developed. Well nourished.  Vascular Dorsalis pedis pulses palpable bilaterally. Posterior tibial pulses palpable bilaterally. Capillary refill normal to all digits.  No cyanosis or clubbing noted. Pedal hair growth normal.  Neurologic Normal speech. Oriented to person, place, and time. Epicritic sensation to light touch grossly present bilaterally.  Decreased size monofilament testing noted to the toes.  Negative Tinel's sign at the common peroneal nerve at the tarsal tunnel  Dermatologic Nails within normal limits Skin within normal limits  Orthopedic: Normal joint ROM without pain or crepitus bilaterally. No visible deformities. No bony tenderness.   Radiographs: None Assessment:   1. Numbness and tingling of both feet   2. History of back pain    Plan:  Patient was evaluated and treated and all questions answered.  Right-sided numbness tingling with a history of lower lumbar arthritis -All questions and concerns were discussed with the patient in extensive detail -Given the amount of numbness tingling that is present I believe this is likely attributed to the lower back.  This also appears to be more positional in nature and is not constant.  I discussed with patient she states understanding.  She will try to follow-up with the spine specialist. -Also discussed some shoe gear modification as well.  No follow-ups on  file.

## 2021-08-22 DIAGNOSIS — M1000 Idiopathic gout, unspecified site: Secondary | ICD-10-CM | POA: Diagnosis not present

## 2021-08-22 DIAGNOSIS — N281 Cyst of kidney, acquired: Secondary | ICD-10-CM | POA: Insufficient documentation

## 2021-08-22 DIAGNOSIS — G4733 Obstructive sleep apnea (adult) (pediatric): Secondary | ICD-10-CM | POA: Diagnosis not present

## 2021-08-22 DIAGNOSIS — E785 Hyperlipidemia, unspecified: Secondary | ICD-10-CM | POA: Diagnosis not present

## 2021-08-22 DIAGNOSIS — N1832 Chronic kidney disease, stage 3b: Secondary | ICD-10-CM | POA: Diagnosis not present

## 2021-08-22 DIAGNOSIS — E1122 Type 2 diabetes mellitus with diabetic chronic kidney disease: Secondary | ICD-10-CM | POA: Diagnosis not present

## 2021-08-22 DIAGNOSIS — R82998 Other abnormal findings in urine: Secondary | ICD-10-CM | POA: Diagnosis not present

## 2021-08-22 DIAGNOSIS — I10 Essential (primary) hypertension: Secondary | ICD-10-CM | POA: Diagnosis not present

## 2021-08-22 DIAGNOSIS — E668 Other obesity: Secondary | ICD-10-CM | POA: Diagnosis not present

## 2021-08-31 DIAGNOSIS — E119 Type 2 diabetes mellitus without complications: Secondary | ICD-10-CM | POA: Diagnosis not present

## 2021-08-31 DIAGNOSIS — H43813 Vitreous degeneration, bilateral: Secondary | ICD-10-CM | POA: Diagnosis not present

## 2021-08-31 DIAGNOSIS — H33021 Retinal detachment with multiple breaks, right eye: Secondary | ICD-10-CM | POA: Diagnosis not present

## 2021-08-31 DIAGNOSIS — H43393 Other vitreous opacities, bilateral: Secondary | ICD-10-CM | POA: Diagnosis not present

## 2021-09-01 DIAGNOSIS — N189 Chronic kidney disease, unspecified: Secondary | ICD-10-CM | POA: Diagnosis not present

## 2021-09-01 DIAGNOSIS — E119 Type 2 diabetes mellitus without complications: Secondary | ICD-10-CM | POA: Diagnosis not present

## 2021-09-05 DIAGNOSIS — E119 Type 2 diabetes mellitus without complications: Secondary | ICD-10-CM | POA: Diagnosis not present

## 2021-09-05 DIAGNOSIS — E785 Hyperlipidemia, unspecified: Secondary | ICD-10-CM | POA: Diagnosis not present

## 2021-09-05 DIAGNOSIS — E668 Other obesity: Secondary | ICD-10-CM | POA: Diagnosis not present

## 2021-09-05 DIAGNOSIS — G4733 Obstructive sleep apnea (adult) (pediatric): Secondary | ICD-10-CM | POA: Diagnosis not present

## 2021-09-05 DIAGNOSIS — M5431 Sciatica, right side: Secondary | ICD-10-CM | POA: Diagnosis not present

## 2021-09-05 DIAGNOSIS — M109 Gout, unspecified: Secondary | ICD-10-CM | POA: Diagnosis not present

## 2021-09-05 DIAGNOSIS — N189 Chronic kidney disease, unspecified: Secondary | ICD-10-CM | POA: Diagnosis not present

## 2021-09-05 DIAGNOSIS — I10 Essential (primary) hypertension: Secondary | ICD-10-CM | POA: Diagnosis not present

## 2021-09-05 DIAGNOSIS — I781 Nevus, non-neoplastic: Secondary | ICD-10-CM | POA: Diagnosis not present

## 2021-09-12 DIAGNOSIS — H33021 Retinal detachment with multiple breaks, right eye: Secondary | ICD-10-CM | POA: Diagnosis not present

## 2021-10-12 ENCOUNTER — Telehealth: Payer: Self-pay | Admitting: Physical Medicine and Rehabilitation

## 2021-10-12 NOTE — Telephone Encounter (Signed)
Pt called and states she had an injection in July worked about 60% but now the pain is getting much worse now and the pain never stopped going down the L leg. Pt states she needs some relief. She would like to know what she can do.

## 2021-10-16 ENCOUNTER — Ambulatory Visit: Payer: HMO | Admitting: Physical Medicine and Rehabilitation

## 2021-10-16 ENCOUNTER — Encounter: Payer: Self-pay | Admitting: Physical Medicine and Rehabilitation

## 2021-10-16 VITALS — BP 112/76 | HR 82

## 2021-10-16 DIAGNOSIS — M5116 Intervertebral disc disorders with radiculopathy, lumbar region: Secondary | ICD-10-CM

## 2021-10-16 DIAGNOSIS — M79605 Pain in left leg: Secondary | ICD-10-CM | POA: Diagnosis not present

## 2021-10-16 DIAGNOSIS — M4316 Spondylolisthesis, lumbar region: Secondary | ICD-10-CM | POA: Diagnosis not present

## 2021-10-16 DIAGNOSIS — M5416 Radiculopathy, lumbar region: Secondary | ICD-10-CM | POA: Diagnosis not present

## 2021-10-16 DIAGNOSIS — M47816 Spondylosis without myelopathy or radiculopathy, lumbar region: Secondary | ICD-10-CM | POA: Diagnosis not present

## 2021-10-16 MED ORDER — TRAMADOL HCL 50 MG PO TABS: 50.0000 mg | ORAL_TABLET | Freq: Four times a day (QID) | ORAL | 0 refills | Status: DC | PRN

## 2021-10-16 NOTE — Progress Notes (Unsigned)
Numeric Pain Rating Scale and Functional Assessment Average Pain 8   In the last MONTH (on 0-10 scale) has pain interfered with the following?  1. General activity like being  able to carry out your everyday physical activities such as walking, climbing stairs, carrying groceries, or moving a chair?  Rating(10)   +Driver, -BT, -Dye Allergies.  Patient present today with lower back pain that radiates into her left leg.Pain is located at the lateral aspect of her lower leg. Numbness in her right foot. She tried oral steroids and they did not help. She finished the steroids Saturday. She is taking tylenol for pain relief.  Had right eye surgery 3 weeks ago for detached retina.

## 2021-10-16 NOTE — Progress Notes (Unsigned)
Kelly Farley - 66 y.o. female MRN [id number]  Date of birth: 09/02/1955  Office Visit Note: Visit Date: 10/16/2021 PCP: Jodi Marble, MD Referred by: Jodi Marble, MD  Subjective: No chief complaint on file.  HPI: Kelly Farley is a 66 y.o. female who comes in today for evaluation of chronic, worsening and severe bilateral lower back pain, pain to left lateral calf and numbness to right foot. Patient was initially referred to use by Dr. Teresa Coombs. Pain ongoing for several months and worsens with movement and activity. Some relief with home exercise regimen, rest and use of medications. Does use Tylenol as needed. Patient was scheduled to start water therapy, but has not been able to go due to severe pain. Recent lumbar MRI imaging exhibits gaping facet joints at L4-L5 that could indicate instability and right paracentral and subarticular disc extrusion with cranial migration at L5-S1. No high grade spinal canal stenosis noted. Recent lumbar x-ray imaging exhibits 7 mm grade 1 anterolisthesis of L4 on L5. Patient underwent right L5-S1 interlaminar epidural steroid injection in our office on 07/19/2021, some relief of pain with this procedure, greater than 50%. Patient states her most severe pain is now bilateral lower back and left lateral calf region. Patient is currently using walker to assist with ambulation.   Patients course is complicated by morbid obesity and diabetes mellitus.        Review of Systems  Musculoskeletal:  Positive for back pain.  Neurological:  Positive for tingling. Negative for focal weakness.  All other systems reviewed and are negative.  Otherwise per HPI.  Assessment & Plan: Visit Diagnoses:    ICD-10-CM   1. Lumbar radiculopathy  M54.16 Ambulatory referral to Physical Medicine Rehab    Ambulatory referral to Orthopedic Surgery    2. Radiculopathy due to lumbar intervertebral disc disorder  M51.16 Ambulatory referral to Physical Medicine  Rehab    Ambulatory referral to Orthopedic Surgery    3. Anterolisthesis of lumbar spine  M43.16 Ambulatory referral to Physical Medicine Rehab    Ambulatory referral to Orthopedic Surgery    4. Pain in left leg  M79.605     5. Facet arthropathy, lumbar  M47.816 Ambulatory referral to Physical Medicine Rehab    Ambulatory referral to Orthopedic Surgery       Plan: Findings:  Chronic, worsening and severe bilateral lower back pain, left lateral calf pain and numbness to right foot. Patient continues to have severe pain despite good conservative therapies such as osteopathic manipulation, home exercise regimen, rest and use of medications. Patients clinical presentation and exam are consistent with left L5 nerve pattern. There is right paracentral and subarticular disc extrusion at L5-S1. There is also dynamic listhesis at L4-L5. Next step is to perform diagnostic and hopefully therapeutic left L5 transforaminal epidural steroid injection under fluoroscopic guidance. I did speak with patient about medication management and prescribed a short course of Tramadol for moderate/severe pain. I also discussed surgical referral with patient, we would like for her to consult with Dr. Ileene Rubens in our office to talk about options. 7 mm anterolisthesis of L4 on L5 could warrant lumbar fusion. No red flag symptoms noted upon exam today.     Meds & Orders:  Meds ordered this encounter  Medications   traMADol (ULTRAM) 50 MG tablet    Sig: Take 1 tablet (50 mg total) by mouth every 6 (six) hours as needed.    Dispense:  20 tablet    Refill:  0  Orders Placed This Encounter  Procedures   Ambulatory referral to Physical Medicine Rehab   Ambulatory referral to Orthopedic Surgery    Follow-up: Return for Left L5 transforaminal epidural steroid injection.   Procedures: No procedures performed      Clinical History: MRI LUMBAR SPINE WITHOUT CONTRAST   TECHNIQUE: Multiplanar, multisequence MR  imaging of the lumbar spine was performed. No intravenous contrast was administered.   COMPARISON:  No prior MRI, correlation is made with lumbar spine radiographs 05/17/2021   FINDINGS: Segmentation:  Standard.   Alignment: No listhesis. The listhesis seen on the radiographs is not apparent on this exam.   Vertebrae:  No acute fracture or suspicious osseous lesion.   Conus medullaris and cauda equina: Conus extends to the T12-L1 level. Conus and cauda equina appear normal.   Paraspinal and other soft tissues: Renal cysts.   Disc levels:   T12-L1: No significant disc bulge. No spinal canal stenosis or neural foraminal narrowing.   L1-L2: No significant disc bulge. Mild facet arthropathy. No spinal canal stenosis or neural foraminal narrowing.   L2-L3: Minimal disc bulge. Mild facet arthropathy. No spinal canal stenosis or neural foraminal narrowing.   L3-L4: Mild disc bulge. Mild-to-moderate facet arthropathy. No spinal canal stenosis. Mild bilateral neural foraminal narrowing.   L4-L5: Mild disc bulge. Moderate to severe facet arthropathy. Widening of the right facets, which can indicate instability. Narrowing of the lateral recesses. Mild spinal canal stenosis. Mild right neural foraminal narrowing.   L5-S1: Disc bulge with superimposed right paracentral and subarticular disc extrusion with 2.1 cm of cranial migration. This contacts and displaces the right S1 nerve roots. Moderate facet arthropathy. No spinal canal stenosis or neural foraminal narrowing.   IMPRESSION: 1. L5-S1 right paracentral and subarticular disc extrusion with cranial migration, which contacts and displaces the right S1 nerve roots. 2. L4-L5 mild spinal canal stenosis and mild right neural foraminal narrowing. Moderate to severe facet arthropathy with widening of the right facets, which can indicate instability. 3. L3-L4 mild bilateral neural foraminal narrowing.     Electronically Signed    By: Merilyn Baba M.D.   On: 06/03/2021 04:20 ---------------------------------- EXAM: LUMBAR SPINE - COMPLETE 4+ VIEW   COMPARISON:  CT abdomen and pelvis 08/06/2020   FINDINGS: There are 5 non-rib-bearing lumbar-type vertebral bodies. On the current standing radiographs, there is 7 mm grade 1 anterolisthesis of L4 on L5. This is new compared to prior CT dated 08/06/2020, however the supine imaging on that study may be related to the lack of L4 on L5 anterolisthesis on CT. Mild anterior L4-5 disc space narrowing. Moderate L4-5 facet joint arthropathy, similar to prior.   Moderate diffuse L5-S1 disc space narrowing. Mild-to-moderate bilateral sacroiliac joint subchondral sclerosis degenerative change. Mild-to-moderate bilateral femoroacetabular joint space narrowing.   IMPRESSION: 1. There is grade 1 anterolisthesis of L4 on L5 on these upright radiographs. This is new compared to prior 08/06/2020 CT, however the supine imaging on the prior CT may contribute to the normal sagittal alignment at this level. 2. Moderate L4-5 facet joint arthropathy, similar to prior. 3. Mild L4-5 and moderate L5-S1 degenerative disc changes.     Electronically Signed   By: Yvonne Kendall M.D.   On: 05/19/2021 12:28   She reports that she has never smoked. She does not have any smokeless tobacco history on file. No results for input(s): "HGBA1C", "LABURIC" in the last 8760 hours.  Objective:  VS:  HT:    WT:   BMI:  BP:112/76  HR:82bpm  TEMP: ( )  RESP:  Physical Exam Vitals and nursing note reviewed.  HENT:     Head: Normocephalic and atraumatic.     Right Ear: External ear normal.     Left Ear: External ear normal.     Nose: Nose normal.     Mouth/Throat:     Mouth: Mucous membranes are moist.  Eyes:     Extraocular Movements: Extraocular movements intact.  Cardiovascular:     Rate and Rhythm: Normal rate.     Pulses: Normal pulses.  Pulmonary:     Effort: Pulmonary effort is  normal.  Abdominal:     General: Abdomen is flat. There is no distension.  Musculoskeletal:        General: Tenderness present.     Cervical back: Normal range of motion.     Comments: Pt is slow to rise from seated position to standing. Good lumbar range of motion. Strong distal strength without clonus, no pain upon palpation of greater trochanters. Dysesthesias noted to left L5 dermatome. Sensation intact bilaterally. Ambulates with walker, gait unsteady.  Skin:    General: Skin is warm and dry.     Capillary Refill: Capillary refill takes less than 2 seconds.  Neurological:     General: No focal deficit present.     Mental Status: She is alert and oriented to person, place, and time.  Psychiatric:        Mood and Affect: Mood normal.        Behavior: Behavior normal.     Ortho Exam  Imaging: No results found.  Past Medical/Family/Surgical/Social History: Medications & Allergies reviewed per EMR, new medications updated. Patient Active Problem List   Diagnosis Date Noted   Other obesity 07/05/2021   Special screening for malignant neoplasms, colon    Cecal polyp    Polyp of sigmoid colon    Past Medical History:  Diagnosis Date   Arthritis    Diabetes mellitus without complication ([place])    Hyperlipemia    Hypertension    Sleep apnea    Family History  Problem Relation Age of Onset   Colon cancer Mother    Breast cancer Maternal Aunt    Past Surgical History:  Procedure Laterality Date   ABDOMINAL HYSTERECTOMY     total   COLONOSCOPY WITH PROPOFOL N/A 08/18/2020   Procedure: COLONOSCOPY WITH PROPOFOL;  Surgeon: Virgel Manifold, MD;  Location: [facility] ENDOSCOPY;  Service: Endoscopy;  Laterality: N/A;   EYE SURGERY     JOINT REPLACEMENT  2014   right knee   Social History   Occupational History   Not on file  Tobacco Use   Smoking status: Never   Smokeless tobacco: Not on file  Substance and Sexual Activity   Alcohol use: No   Drug use: Not on file    Sexual activity: Not on file

## 2021-10-18 ENCOUNTER — Other Ambulatory Visit: Payer: Self-pay | Admitting: Physical Medicine and Rehabilitation

## 2021-10-18 ENCOUNTER — Ambulatory Visit (INDEPENDENT_AMBULATORY_CARE_PROVIDER_SITE_OTHER): Payer: HMO | Admitting: Orthopedic Surgery

## 2021-10-18 ENCOUNTER — Encounter: Payer: Self-pay | Admitting: Orthopedic Surgery

## 2021-10-18 ENCOUNTER — Ambulatory Visit (INDEPENDENT_AMBULATORY_CARE_PROVIDER_SITE_OTHER): Payer: HMO

## 2021-10-18 ENCOUNTER — Telehealth: Payer: Self-pay | Admitting: Orthopedic Surgery

## 2021-10-18 ENCOUNTER — Telehealth: Payer: Self-pay | Admitting: Physical Medicine and Rehabilitation

## 2021-10-18 VITALS — BP 99/61 | HR 83 | Ht 67.5 in | Wt 300.0 lb

## 2021-10-18 DIAGNOSIS — M5416 Radiculopathy, lumbar region: Secondary | ICD-10-CM

## 2021-10-18 DIAGNOSIS — M5116 Intervertebral disc disorders with radiculopathy, lumbar region: Secondary | ICD-10-CM

## 2021-10-18 DIAGNOSIS — M4316 Spondylolisthesis, lumbar region: Secondary | ICD-10-CM

## 2021-10-18 NOTE — Telephone Encounter (Signed)
Pt called asking for a referral be sent for pt to see a Neuro dr. Please call pt about this matter at [phone]

## 2021-10-18 NOTE — Telephone Encounter (Signed)
Patient is being seen in the office today by Dr. Laurance Flatten, I did speak with her briefly. She reports nausea/vomiting with the Tramadol. I did instruct her to stop taking this medication. She also expressed concern about long wait for injection in our office. I did offer to place order for injection at [facility], she would like to move forward with this.

## 2021-10-18 NOTE — Progress Notes (Signed)
Orthopedic Spine Surgery Office Note  Assessment: Patient is a 66 y.o. female with left calf pain of unclear etiology. It is in the distribution of the L5 and S1 nerves but does not go into the foot. I think it is more likely coming from the knee and possible a ruptured baker's cyst because her positive physical exam findings on the leg today included tenderness over the posterior aspect of the knee and calf and pain with flexion at the knee. Plus her MRI did not show any significant left sided compression of the nerves, but it could be that she has a new herniated disc or her mild foraminal stenosis has gotten worse.    Plan: -I explained my above assessment to her including that I felt that the pain seemed to be more likely that it was coming from the knee since radiculopathy does not usually palpation of the leg does not reproduce radicular type pain.  However, she says that she has had knee pain for some time and is gotten multiple injections to that knee.  She feels that this pain is different.  She says it feels more like the pain that she had when she had a disc herniation that caused right leg symptoms.  She is not interested in work up of the knee since she is already getting injections and is being seen regularly for that. She wants to get an injection since that helped her with her right-sided symptoms.  The area of pain could be an L5 or S1 radiculopathy, so I do not think an injection is unreasonable and she wants to pursue that.  She already has a referral to Dr. Ernestina Patches who is planning on doing an injection on 10/30/2021.  She is in a lot of pain now and does not want to wait that long so Dr. Romona Curls office provided her a referral to go to another provider for a possible lumbar spine injection.  She is going to pursue that as a treatment and I told her if that does give her relief that will help with diagnosis and will hopefully be therapeutic.  She can return to my office on an as-needed  basis.   Patient expressed understanding of the plan and all questions were answered to their satisfaction.   ___________________________________________________________________________   History:  Patient is a 66 y.o. female with history of chronic low back pain who presents today for lumbar spine.  Patient states that she has a history of right-sided disc herniation that resulted in radicular type pain down that leg.  She had tingling and decree sensation on that leg as well.  She says she got an injection for that and that helped her symptoms significantly.  She says she has been left with some residual right foot decree sensation.  Then, in the last 2 weeks she has noticed acute onset of left leg pain.  She feels it in the posterior and lateral aspect of the left calf.  It is worse with most activities and even sitting down and still hurts.  It only goes away significantly if she lays completely flat.  She had no trauma or injury that brought on the pain.  She has the residual right foot numbness but no other symptoms in the right leg.   Weakness: Denies Symptoms of imbalance: Denies Paresthesias and numbness: Yes, has residual right foot numbness but no new numbness or tingling Bowel or bladder incontinence: Denies Saddle anesthesia: Denies  Treatments tried: Tylenol, activity modification  Review of  systems: Reports history of chills and night sweats since this pain started.  Denies unexplained weight loss, history of cancer, pain that wakes them at night  Past medical history: High cholesterol Hypertension Chronic kidney disease Diabetes Neuropathy Sleep apnea Chronic pain  Allergies: Soy, nuts  Past surgical history:  Right knee replacement Right patellar tendon repair Hysterectomy Cataract surgery Retinal detachment surgery  Social history: Denies use of nicotine product (smoking, vaping, patches, smokeless) Alcohol use: Yes, 1 drink per week Denies recreational  drug use   Physical Exam:  She is 5 7 and 300 pounds  General: no acute distress, appears stated age Neurologic: alert, answering questions appropriately, following commands Respiratory: unlabored breathing on room air, symmetric chest rise Psychiatric: appropriate affect, normal cadence to speech   MSK (spine):  -Strength exam      Left  Right EHL    5/5  5/5 TA    5/5  5/5 GSC    5/5  5/5 Knee extension  5/5  5/5 Hip flexion   5/5  5/5  -Sensory exam    Sensation intact to light touch in L3-S1 nerve distributions of bilateral lower extremities (decreased sensation in L5 and S1 distributions on the right foot)  -Achilles DTR: 1/4 on the left, 1/4 on the right -Patellar tendon DTR: 1/4 on the left, 1/4 on the right  -Seated and contralateral seated straight leg raise were both negative -Clonus: no beats bilaterally  -Left hip exam: Limited internal rotation but no pain through range of motion, negative Stinchfield -Right hip exam: Limited internal rotation but no pain through range of motion, negative Stinchfield  -Tender to palpation over the posterior aspect of the knee, tender to palpation over the posterior aspect and lateral aspect of the calf, no other tenderness to palpation, EHL/TA/GSC intact, foot warm and well-perfused, no palpable Baker's cyst posterior to the knee, pain with McMurray's test, pain with flexion past 70 degrees at the knee, knee range of motion from 3 to 100 degrees, knee stable to varus and valgus stress, negative Lachman (difficult though due to guarding and body habitus), negative posterior drawer, no effusion  Imaging: X-ray of the lumbar spine from 10/18/2021 and 05/17/2021 was independently reviewed and interpreted, showing disc height loss at L3-4 L4-5 and L5-S1.  There is a spondylolisthesis at L4-5.  Less than 3 mm change in the anterior listhesis between the flexion and extension views obtained today.  No acute osseous abnormality.  MRI of  the lumbar spine from 06/02/2021 was independently reviewed and interpreted, showing large disc herniation from L5-S1 that has migrated cranially and is located on the right side.  She has T2 signal within the L4-5 facet joints with the right side measuring over 3 mm.  Lateral recess stenosis at L4-5. Foraminal stenosis at L3/4.    Patient name: Kelly Farley Patient MRN: [id number] Date of visit: 10/18/21

## 2021-10-24 ENCOUNTER — Ambulatory Visit
Admission: RE | Admit: 2021-10-24 | Discharge: 2021-10-24 | Disposition: A | Discharge status: Home / Self Care | Payer: HMO | Source: Ambulatory Visit | Attending: Physical Medicine and Rehabilitation | Admitting: Physical Medicine and Rehabilitation

## 2021-10-24 MED ORDER — IOPAMIDOL (ISOVUE-M 200) INJECTION 41%
1.0000 mL | Freq: Once | INTRAMUSCULAR | Status: AC
  Administered 2021-10-24: 1 mL via EPIDURAL

## 2021-10-24 MED ORDER — METHYLPREDNISOLONE ACETATE 40 MG/ML INJ SUSP (RADIOLOG
80.0000 mg | Freq: Once | INTRAMUSCULAR | Status: AC
  Administered 2021-10-24: 80 mg via EPIDURAL

## 2021-10-24 NOTE — Discharge Instructions (Signed)

## 2021-10-25 ENCOUNTER — Telehealth: Payer: Self-pay | Admitting: Physical Medicine and Rehabilitation

## 2021-10-25 NOTE — Telephone Encounter (Signed)
Patient called. She would like to [facility] her appointment with Dr. Ernestina Patches. Cb number is [phone]

## 2021-10-26 ENCOUNTER — Other Ambulatory Visit: Payer: Self-pay

## 2021-10-26 DIAGNOSIS — M4316 Spondylolisthesis, lumbar region: Secondary | ICD-10-CM

## 2021-10-26 DIAGNOSIS — M47816 Spondylosis without myelopathy or radiculopathy, lumbar region: Secondary | ICD-10-CM

## 2021-10-26 DIAGNOSIS — M5416 Radiculopathy, lumbar region: Secondary | ICD-10-CM

## 2021-10-26 DIAGNOSIS — M5116 Intervertebral disc disorders with radiculopathy, lumbar region: Secondary | ICD-10-CM

## 2021-10-26 DIAGNOSIS — M79605 Pain in left leg: Secondary | ICD-10-CM

## 2021-10-26 NOTE — Telephone Encounter (Signed)
Per patient-appt cancelled. Stated she got injection sooner at [facility].

## 2021-10-26 NOTE — Telephone Encounter (Signed)
Referral placed.

## 2021-10-26 NOTE — Telephone Encounter (Signed)
Try Kelly Farley thx

## 2021-10-30 ENCOUNTER — Encounter: Payer: HMO | Admitting: Physical Medicine and Rehabilitation

## 2021-11-03 ENCOUNTER — Other Ambulatory Visit: Payer: Self-pay | Admitting: Sports Medicine

## 2021-11-03 DIAGNOSIS — M25561 Pain in right knee: Secondary | ICD-10-CM

## 2021-11-03 DIAGNOSIS — M25562 Pain in left knee: Secondary | ICD-10-CM

## 2021-11-06 ENCOUNTER — Ambulatory Visit
Admission: RE | Admit: 2021-11-06 | Discharge: 2021-11-06 | Disposition: A | Discharge status: Home / Self Care | Payer: HMO | Source: Ambulatory Visit | Attending: Sports Medicine | Admitting: Sports Medicine

## 2021-11-06 ENCOUNTER — Ambulatory Visit
Admission: RE | Admit: 2021-11-06 | Discharge: 2021-11-06 | Disposition: A | Discharge status: Home / Self Care | Payer: HMO | Attending: Sports Medicine | Admitting: Sports Medicine

## 2021-11-06 DIAGNOSIS — M25561 Pain in right knee: Secondary | ICD-10-CM | POA: Insufficient documentation

## 2021-11-06 DIAGNOSIS — M25562 Pain in left knee: Secondary | ICD-10-CM

## 2021-11-06 DIAGNOSIS — Z96651 Presence of right artificial knee joint: Secondary | ICD-10-CM | POA: Diagnosis not present

## 2021-11-24 ENCOUNTER — Other Ambulatory Visit: Payer: Self-pay | Admitting: Neurological Surgery

## 2021-12-11 NOTE — Pre-Procedure Instructions (Signed)
Surgical Instructions    Your procedure is scheduled on Tuesday, December 19, 2021 at 11:44 AM.  Report to [facility] Main Entrance "A" at 9:45 A.M., then check in with the Admitting office.  Call this number if you have problems the morning of surgery:  [phone]   If you have any questions prior to your surgery date call [phone]: Open Monday-Friday 8am-4pm  *If you experience any cold or flu symptoms such as cough, fever, chills, shortness of breath, etc. between now and your scheduled surgery, please notify us.*    Remember:  Do not eat after midnight the night before your surgery  You may drink clear liquids until 8:45 AM the morning of your surgery.   Clear liquids allowed are: Water, Non-Citrus Juices (without pulp), Carbonated Beverages, Clear Tea, Black Coffee Only (NO MILK, CREAM OR POWDERED CREAMER of any kind), and Gatorade.    Take these medicines the morning of surgery with A SIP OF WATER:  allopurinol (ZYLOPRIM)  amLODipine (NORVASC)  hydrALAZINE (APRESOLINE)  metoprolol succinate (TOPROL-XL)  pravastatin (PRAVACHOL)   IF NEEDED: acetaminophen (TYLENOL)  cetirizine (ZYRTEC)  fluticasone (FLONASE)   As of today, STOP taking any Aspirin (unless otherwise instructed by your surgeon) Aleve, Naproxen, Ibuprofen, Motrin, Advil, Goody's, BC's, all herbal medications, fish oil, and all vitamins.  WHAT DO I DO ABOUT MY DIABETES MEDICATION?   Stop taking your RYBELSUS 24 hours prior to your procedure. Your last dose will be on 12/17/21.    HOW TO MANAGE YOUR DIABETES BEFORE AND AFTER SURGERY  Why is it important to control my blood sugar before and after surgery? Improving blood sugar levels before and after surgery helps healing and can limit problems. A way of improving blood sugar control is eating a healthy diet by:  Eating less sugar and carbohydrates  Increasing activity/exercise  Talking with your doctor about reaching your blood sugar goals High  blood sugars (greater than 180 mg/dL) can raise your risk of infections and slow your recovery, so you will need to focus on controlling your diabetes during the weeks before surgery. Make sure that the doctor who takes care of your diabetes knows about your planned surgery including the date and location.  How do I manage my blood sugar before surgery? Check your blood sugar at least 4 times a day, starting 2 days before surgery, to make sure that the level is not too high or low.  Check your blood sugar the morning of your surgery when you wake up and every 2 hours until you get to the Short Stay unit.  If your blood sugar is less than 70 mg/dL, you will need to treat for low blood sugar: Do not take insulin. Treat a low blood sugar (less than 70 mg/dL) with  cup of clear juice (cranberry or apple), 4 glucose tablets, OR glucose gel. Recheck blood sugar in 15 minutes after treatment (to make sure it is greater than 70 mg/dL). If your blood sugar is not greater than 70 mg/dL on recheck, call [phone] for further instructions. Report your blood sugar to the short stay nurse when you get to Short Stay.  If you are admitted to the hospital after surgery: Your blood sugar will be checked by the staff and you will probably be given insulin after surgery (instead of oral diabetes medicines) to make sure you have good blood sugar levels. The goal for blood sugar control after surgery is 80-180 mg/dL.  Do NOT Smoke (Tobacco/Vaping) for 24 hours prior to your procedure.  If you use a CPAP at night, you may bring your mask/headgear for your overnight stay.   Contacts, glasses, piercing's, hearing aid's, dentures or partials may not be worn into surgery, please bring cases for these belongings.    For patients admitted to the hospital, discharge time will be determined by your treatment team.   Patients discharged the day of surgery will not be allowed to drive home, and  someone needs to stay with them for 24 hours.  SURGICAL WAITING ROOM VISITATION Patients having surgery or a procedure may have two support people in the waiting area. Visitors may stay in the waiting area during the procedure and switch out with other visitors if needed. Children under the age of 39 must have an adult accompany them who is not the patient. If the patient needs to stay at the hospital during part of their recovery, the visitor guidelines for inpatient rooms apply.  Please refer to the [facility] website for the visitor guidelines for Inpatients (after your surgery is over and you are in a regular room).    Special instructions:   [place]- Preparing For Surgery  Before surgery, you can play an important role. Because skin is not sterile, your skin needs to be as free of germs as possible. You can reduce the number of germs on your skin by washing with CHG (chlorahexidine gluconate) Soap before surgery.  CHG is an antiseptic cleaner which kills germs and bonds with the skin to continue killing germs even after washing.    Oral Hygiene is also important to reduce your risk of infection.  Remember - BRUSH YOUR TEETH THE MORNING OF SURGERY WITH YOUR REGULAR TOOTHPASTE  Please do not use if you have an allergy to CHG or antibacterial soaps. If your skin becomes reddened/irritated stop using the CHG.  Do not shave (including legs and underarms) for at least 48 hours prior to first CHG shower. It is OK to shave your face.  Please follow these instructions carefully.   Shower the NIGHT BEFORE SURGERY and the MORNING OF SURGERY  If you chose to wash your hair, wash your hair first as usual with your normal shampoo.  After you shampoo, rinse your hair and body thoroughly to remove the shampoo.  Use CHG Soap as you would any other liquid soap. You can apply CHG directly to the skin and wash gently with a scrungie or a clean washcloth.   Apply the CHG Soap to your body ONLY  FROM THE NECK DOWN.  Do not use on open wounds or open sores. Avoid contact with your eyes, ears, mouth and genitals (private parts). Wash Face and genitals (private parts)  with your normal soap.   Wash thoroughly, paying special attention to the area where your surgery will be performed.  Thoroughly rinse your body with warm water from the neck down.  DO NOT shower/wash with your normal soap after using and rinsing off the CHG Soap.  Pat yourself dry with a CLEAN TOWEL.  Wear CLEAN PAJAMAS to bed the night before surgery  Place CLEAN SHEETS on your bed the night before your surgery  DO NOT SLEEP WITH PETS.   Day of Surgery: Take a shower with CHG soap. Do not wear jewelry or makeup Do not wear lotions, powders, perfumes/colognes, or deodorant. Do not shave 48 hours prior to surgery. Do not wear nail polish, gel polish, artificial nails, or any other  type of covering on natural nails (fingers and toes) If you have artificial nails or gel coating that need to be removed by a nail salon, please have this removed prior to surgery. Artificial nails or gel coating may interfere with anesthesia's ability to adequately monitor your vital signs. Wear Clean/Comfortable clothing the morning of surgery Do not bring valuables to the hospital.  [facility] is not responsible for any belongings or valuables. Remember to brush your teeth WITH YOUR REGULAR TOOTHPASTE.   Please read over the following fact sheets that you were given.  If you received a COVID test during your pre-op visit  it is requested that you wear a mask when out in public, stay away from anyone that may not be feeling well and notify your surgeon if you develop symptoms. If you have been in contact with anyone that has tested positive in the last 10 days please notify you surgeon.

## 2021-12-12 ENCOUNTER — Encounter (HOSPITAL_COMMUNITY): Payer: Self-pay

## 2021-12-12 ENCOUNTER — Other Ambulatory Visit: Payer: Self-pay

## 2021-12-12 ENCOUNTER — Encounter (HOSPITAL_COMMUNITY)
Admission: RE | Admit: 2021-12-12 | Discharge: 2021-12-12 | Disposition: A | Discharge status: Home / Self Care | Payer: HMO | Source: Ambulatory Visit | Attending: Neurological Surgery | Admitting: Neurological Surgery

## 2021-12-12 VITALS — BP 148/97 | HR 67 | Temp 97.5°F | Resp 20 | Ht 67.0 in | Wt 290.9 lb

## 2021-12-12 DIAGNOSIS — Z01818 Encounter for other preprocedural examination: Secondary | ICD-10-CM

## 2021-12-12 DIAGNOSIS — Z794 Long term (current) use of insulin: Secondary | ICD-10-CM | POA: Diagnosis not present

## 2021-12-12 DIAGNOSIS — E119 Type 2 diabetes mellitus without complications: Secondary | ICD-10-CM | POA: Diagnosis not present

## 2021-12-12 HISTORY — DX: Palpitations: R00.2

## 2021-12-12 LAB — BASIC METABOLIC PANEL
Anion gap: 7 (ref 5–15)
BUN: 19 mg/dL (ref 8–23)
CO2: 29 mmol/L (ref 22–32)
Calcium: 9.6 mg/dL (ref 8.9–10.3)
Chloride: 102 mmol/L (ref 98–111)
Creatinine, Ser: 1.15 mg/dL — ABNORMAL HIGH (ref 0.44–1.00)
GFR, Estimated: 52 mL/min — ABNORMAL LOW (ref >60)
Glucose, Bld: 106 mg/dL — ABNORMAL HIGH (ref 70–99)
Potassium: 3.8 mmol/L (ref 3.5–5.1)
Sodium: 138 mmol/L (ref 135–145)

## 2021-12-12 LAB — GLUCOSE, CAPILLARY: Glucose-Capillary: 126 mg/dL — ABNORMAL HIGH (ref 70–99)

## 2021-12-12 LAB — CBC
HCT: 36 % (ref 36.0–46.0)
Hemoglobin: 12.3 g/dL (ref 12.0–15.0)
MCH: 31.5 pg (ref 26.0–34.0)
MCHC: 34.2 g/dL (ref 30.0–36.0)
MCV: 92.1 fL (ref 80.0–100.0)
Platelets: 297 10*3/uL (ref 150–400)
RBC: 3.91 MIL/uL (ref 3.87–5.11)
RDW: 12.8 % (ref 11.5–15.5)
WBC: 5.1 10*3/uL (ref 4.0–10.5)
nRBC: 0 % (ref 0.0–0.2)

## 2021-12-12 LAB — TYPE AND SCREEN
ABO/RH(D): O POS
Antibody Screen: NEGATIVE

## 2021-12-12 LAB — SURGICAL PCR SCREEN
MRSA, PCR: NEGATIVE
Staphylococcus aureus: NEGATIVE

## 2021-12-12 NOTE — Progress Notes (Addendum)
PCP - Dr. Dorisann Frames Cardiologist - Denies  PPM/ICD - Denies Device Orders -  Rep Notified -   Chest x-ray - N/A EKG - 12/12/21 Stress Test - Denies ECHO - Denies Cardiac Cath - Denies  Sleep Study - OSA CPAP - Yes  Fasting Blood Sugar - 82-90 Checks Blood Sugar 2 times a day  Last dose of GLP1 agonist-  LD should be on 12/17/21 GLP1 instructions: Stop 24 hours prior to surgery (LD: 12/17/21)  Blood Thinner Instructions: N/A Aspirin Instructions: N/A  ERAS Protcol - Yes PRE-SURGERY Ensure or G2- No  COVID TEST- N/A   Anesthesia review: No  Patient denies shortness of breath, fever, cough and chest pain at PAT appointment   All instructions explained to the patient, with a verbal understanding of the material. Patient agrees to go over the instructions while at home for a better understanding. Patient also instructed to self quarantine after being tested for COVID-19. The opportunity to ask questions was provided.

## 2021-12-13 LAB — HEMOGLOBIN A1C
Hgb A1c MFr Bld: 6 % — ABNORMAL HIGH (ref 4.8–5.6)
Mean Plasma Glucose: 126 mg/dL

## 2021-12-15 ENCOUNTER — Other Ambulatory Visit: Payer: Self-pay | Admitting: Internal Medicine

## 2021-12-15 DIAGNOSIS — Z1231 Encounter for screening mammogram for malignant neoplasm of breast: Secondary | ICD-10-CM

## 2021-12-19 ENCOUNTER — Inpatient Hospital Stay (HOSPITAL_COMMUNITY): Payer: PPO | Admitting: Certified Registered Nurse Anesthetist

## 2021-12-19 ENCOUNTER — Inpatient Hospital Stay (HOSPITAL_COMMUNITY): Payer: PPO | Admitting: Physician Assistant

## 2021-12-19 ENCOUNTER — Inpatient Hospital Stay (HOSPITAL_COMMUNITY): Payer: PPO

## 2021-12-19 ENCOUNTER — Other Ambulatory Visit: Payer: Self-pay

## 2021-12-19 ENCOUNTER — Encounter (HOSPITAL_COMMUNITY)
Admission: RE | Disposition: A | Discharge status: Home / Self Care | Payer: Self-pay | Source: Home / Self Care | Attending: Neurological Surgery

## 2021-12-19 ENCOUNTER — Inpatient Hospital Stay (HOSPITAL_COMMUNITY)
Admission: RE | Admit: 2021-12-19 | Discharge: 2021-12-21 | DRG: 454 | Disposition: A | Discharge status: Home / Self Care | Payer: PPO | Attending: Neurological Surgery | Admitting: Neurological Surgery

## 2021-12-19 ENCOUNTER — Encounter (HOSPITAL_COMMUNITY): Payer: Self-pay | Admitting: Neurological Surgery

## 2021-12-19 DIAGNOSIS — N189 Chronic kidney disease, unspecified: Secondary | ICD-10-CM

## 2021-12-19 DIAGNOSIS — G4730 Sleep apnea, unspecified: Secondary | ICD-10-CM | POA: Diagnosis present

## 2021-12-19 DIAGNOSIS — E785 Hyperlipidemia, unspecified: Secondary | ICD-10-CM | POA: Diagnosis present

## 2021-12-19 DIAGNOSIS — M4316 Spondylolisthesis, lumbar region: Secondary | ICD-10-CM | POA: Diagnosis present

## 2021-12-19 DIAGNOSIS — E119 Type 2 diabetes mellitus without complications: Secondary | ICD-10-CM | POA: Diagnosis present

## 2021-12-19 DIAGNOSIS — M48061 Spinal stenosis, lumbar region without neurogenic claudication: Secondary | ICD-10-CM | POA: Diagnosis present

## 2021-12-19 DIAGNOSIS — Z800 Family history of malignant neoplasm of digestive organs: Secondary | ICD-10-CM | POA: Diagnosis not present

## 2021-12-19 DIAGNOSIS — Z803 Family history of malignant neoplasm of breast: Secondary | ICD-10-CM

## 2021-12-19 DIAGNOSIS — Z79899 Other long term (current) drug therapy: Secondary | ICD-10-CM | POA: Diagnosis not present

## 2021-12-19 DIAGNOSIS — Z6841 Body Mass Index (BMI) 40.0 and over, adult: Secondary | ICD-10-CM

## 2021-12-19 DIAGNOSIS — I10 Essential (primary) hypertension: Secondary | ICD-10-CM | POA: Diagnosis present

## 2021-12-19 DIAGNOSIS — I129 Hypertensive chronic kidney disease with stage 1 through stage 4 chronic kidney disease, or unspecified chronic kidney disease: Secondary | ICD-10-CM | POA: Diagnosis not present

## 2021-12-19 DIAGNOSIS — M5416 Radiculopathy, lumbar region: Secondary | ICD-10-CM | POA: Diagnosis present

## 2021-12-19 DIAGNOSIS — E1122 Type 2 diabetes mellitus with diabetic chronic kidney disease: Secondary | ICD-10-CM | POA: Diagnosis not present

## 2021-12-19 HISTORY — PX: TRANSFORAMINAL LUMBAR INTERBODY FUSION (TLIF) WITH PEDICLE SCREW FIXATION 2 LEVEL: SHX6142

## 2021-12-19 LAB — GLUCOSE, CAPILLARY
Glucose-Capillary: 117 mg/dL — ABNORMAL HIGH (ref 70–99)
Glucose-Capillary: 91 mg/dL (ref 70–99)
Glucose-Capillary: 97 mg/dL (ref 70–99)
Glucose-Capillary: 108 mg/dL — ABNORMAL HIGH (ref 70–99)
Glucose-Capillary: 130 mg/dL — ABNORMAL HIGH (ref 70–99)
Glucose-Capillary: 192 mg/dL — ABNORMAL HIGH (ref 70–99)

## 2021-12-19 SURGERY — TRANSFORAMINAL LUMBAR INTERBODY FUSION (TLIF) WITH PEDICLE SCREW FIXATION 2 LEVEL
Anesthesia: General

## 2021-12-19 MED ORDER — LIDOCAINE-EPINEPHRINE 1 %-1:100000 IJ SOLN
INTRAMUSCULAR | Status: DC | PRN
  Administered 2021-12-19: 10 mL

## 2021-12-19 MED ORDER — CHLORHEXIDINE GLUCONATE 0.12 % MT SOLN
15.0000 mL | Freq: Once | OROMUCOSAL | Status: AC
  Administered 2021-12-19: 15 mL via OROMUCOSAL
  Filled 2021-12-19: qty 15

## 2021-12-19 MED ORDER — DEXAMETHASONE SODIUM PHOSPHATE 10 MG/ML IJ SOLN
INTRAMUSCULAR | Status: AC
  Filled 2021-12-19: qty 1

## 2021-12-19 MED ORDER — CHLORHEXIDINE GLUCONATE CLOTH 2 % EX PADS: 6.0000 | MEDICATED_PAD | Freq: Once | CUTANEOUS | Status: DC

## 2021-12-19 MED ORDER — THROMBIN 5000 UNITS EX SOLR
OROMUCOSAL | Status: DC | PRN
  Administered 2021-12-19: 5 mL via TOPICAL

## 2021-12-19 MED ORDER — ROCURONIUM BROMIDE 10 MG/ML (PF) SYRINGE
PREFILLED_SYRINGE | INTRAVENOUS | Status: AC
  Filled 2021-12-19: qty 10

## 2021-12-19 MED ORDER — CEFAZOLIN SODIUM 1 G IJ SOLR
INTRAMUSCULAR | Status: AC
  Filled 2021-12-19: qty 30

## 2021-12-19 MED ORDER — MENTHOL 3 MG MT LOZG: 1.0000 | LOZENGE | OROMUCOSAL | Status: DC | PRN

## 2021-12-19 MED ORDER — ONDANSETRON HCL 4 MG/2ML IJ SOLN
INTRAMUSCULAR | Status: DC | PRN
  Administered 2021-12-19: 4 mg via INTRAVENOUS

## 2021-12-19 MED ORDER — THROMBIN 5000 UNITS EX SOLR
CUTANEOUS | Status: AC
  Filled 2021-12-19: qty 5000

## 2021-12-19 MED ORDER — ONDANSETRON HCL 4 MG/2ML IJ SOLN: 4.0000 mg | Freq: Four times a day (QID) | INTRAMUSCULAR | Status: DC | PRN

## 2021-12-19 MED ORDER — FENTANYL CITRATE (PF) 250 MCG/5ML IJ SOLN
INTRAMUSCULAR | Status: DC | PRN
  Administered 2021-12-19: 25 ug via INTRAVENOUS
  Administered 2021-12-19: 50 ug via INTRAVENOUS
  Administered 2021-12-19: 25 ug via INTRAVENOUS
  Administered 2021-12-19: 50 ug via INTRAVENOUS
  Administered 2021-12-19: 100 ug via INTRAVENOUS

## 2021-12-19 MED ORDER — MEPERIDINE HCL 25 MG/ML IJ SOLN: 6.2500 mg | INTRAMUSCULAR | Status: DC | PRN

## 2021-12-19 MED ORDER — DEXAMETHASONE SODIUM PHOSPHATE 10 MG/ML IJ SOLN
INTRAMUSCULAR | Status: DC | PRN
  Administered 2021-12-19: 10 mg via INTRAVENOUS

## 2021-12-19 MED ORDER — PRAVASTATIN SODIUM 40 MG PO TABS
40.0000 mg | ORAL_TABLET | Freq: Every day | ORAL | Status: DC
  Administered 2021-12-19 – 2021-12-20 (×2): 40 mg via ORAL
  Filled 2021-12-19 (×2): qty 1

## 2021-12-19 MED ORDER — KETAMINE HCL 50 MG/5ML IJ SOSY
PREFILLED_SYRINGE | INTRAMUSCULAR | Status: AC
  Filled 2021-12-19: qty 5

## 2021-12-19 MED ORDER — INSULIN ASPART 100 UNIT/ML IJ SOLN: 0.0000 [IU] | Freq: Three times a day (TID) | INTRAMUSCULAR | Status: DC

## 2021-12-19 MED ORDER — FENTANYL CITRATE (PF) 250 MCG/5ML IJ SOLN
INTRAMUSCULAR | Status: AC
  Filled 2021-12-19: qty 5

## 2021-12-19 MED ORDER — OXYCODONE HCL 5 MG PO TABS
10.0000 mg | ORAL_TABLET | ORAL | Status: DC | PRN
  Administered 2021-12-20 – 2021-12-21 (×3): 10 mg via ORAL
  Filled 2021-12-19 (×3): qty 2

## 2021-12-19 MED ORDER — DOCUSATE SODIUM 100 MG PO CAPS
100.0000 mg | ORAL_CAPSULE | Freq: Two times a day (BID) | ORAL | Status: DC
  Administered 2021-12-19 – 2021-12-21 (×4): 100 mg via ORAL
  Filled 2021-12-19 (×4): qty 1

## 2021-12-19 MED ORDER — ONDANSETRON HCL 4 MG/2ML IJ SOLN
INTRAMUSCULAR | Status: AC
  Filled 2021-12-19: qty 2

## 2021-12-19 MED ORDER — ACETAMINOPHEN 500 MG PO TABS
1000.0000 mg | ORAL_TABLET | Freq: Once | ORAL | Status: AC
  Administered 2021-12-19: 1000 mg via ORAL
  Filled 2021-12-19: qty 2

## 2021-12-19 MED ORDER — PHENYLEPHRINE HCL (PRESSORS) 10 MG/ML IV SOLN
INTRAVENOUS | Status: DC | PRN
  Administered 2021-12-19: 160 ug via INTRAVENOUS
  Administered 2021-12-19: 80 ug via INTRAVENOUS
  Administered 2021-12-19: 160 ug via INTRAVENOUS
  Administered 2021-12-19 (×2): 80 ug via INTRAVENOUS
  Administered 2021-12-19: 160 ug via INTRAVENOUS

## 2021-12-19 MED ORDER — LORATADINE 10 MG PO TABS: 10.0000 mg | ORAL_TABLET | Freq: Every day | ORAL | Status: DC | PRN

## 2021-12-19 MED ORDER — ACETAMINOPHEN 325 MG PO TABS
650.0000 mg | ORAL_TABLET | ORAL | Status: DC | PRN
  Administered 2021-12-20 – 2021-12-21 (×2): 650 mg via ORAL
  Filled 2021-12-19 (×3): qty 2

## 2021-12-19 MED ORDER — HYDROCHLOROTHIAZIDE 25 MG PO TABS
25.0000 mg | ORAL_TABLET | Freq: Every day | ORAL | Status: DC
  Administered 2021-12-20: 25 mg via ORAL
  Filled 2021-12-19: qty 1

## 2021-12-19 MED ORDER — SUGAMMADEX SODIUM 200 MG/2ML IV SOLN
INTRAVENOUS | Status: DC | PRN
  Administered 2021-12-19: 526 mg via INTRAVENOUS

## 2021-12-19 MED ORDER — CEFAZOLIN SODIUM-DEXTROSE 2-4 GM/100ML-% IV SOLN: 2.0000 g | Freq: Three times a day (TID) | INTRAVENOUS | Status: AC

## 2021-12-19 MED ORDER — ONDANSETRON HCL 4 MG PO TABS: 4.0000 mg | ORAL_TABLET | Freq: Four times a day (QID) | ORAL | Status: DC | PRN

## 2021-12-19 MED ORDER — POLYETHYLENE GLYCOL 3350 17 G PO PACK: 17.0000 g | PACK | Freq: Every day | ORAL | Status: DC | PRN

## 2021-12-19 MED ORDER — PHENOL 1.4 % MT LIQD: 1.0000 | OROMUCOSAL | Status: DC | PRN

## 2021-12-19 MED ORDER — INSULIN ASPART 100 UNIT/ML IJ SOLN: 0.0000 [IU] | Freq: Every day | INTRAMUSCULAR | Status: DC

## 2021-12-19 MED ORDER — 0.9 % SODIUM CHLORIDE (POUR BTL) OPTIME
TOPICAL | Status: DC | PRN
  Administered 2021-12-19: 1000 mL

## 2021-12-19 MED ORDER — IRBESARTAN 150 MG PO TABS: 300.0000 mg | ORAL_TABLET | Freq: Every day | ORAL | Status: DC

## 2021-12-19 MED ORDER — SODIUM CHLORIDE 0.9% FLUSH: 3.0000 mL | Freq: Two times a day (BID) | INTRAVENOUS | Status: DC

## 2021-12-19 MED ORDER — SODIUM CHLORIDE 0.9 % IV SOLN: 250.0000 mL | INTRAVENOUS | Status: DC

## 2021-12-19 MED ORDER — ORAL CARE MOUTH RINSE: 15.0000 mL | Freq: Once | OROMUCOSAL | Status: AC

## 2021-12-19 MED ORDER — HYDROMORPHONE HCL 1 MG/ML IJ SOLN
INTRAMUSCULAR | Status: AC
  Filled 2021-12-19: qty 1

## 2021-12-19 MED ORDER — LACTATED RINGERS IV SOLN: INTRAVENOUS | Status: DC

## 2021-12-19 MED ORDER — ALLOPURINOL 100 MG PO TABS
100.0000 mg | ORAL_TABLET | Freq: Every day | ORAL | Status: DC
  Administered 2021-12-20 – 2021-12-21 (×2): 100 mg via ORAL
  Filled 2021-12-19 (×2): qty 1

## 2021-12-19 MED ORDER — HYDROMORPHONE HCL 1 MG/ML IJ SOLN: 1.0000 mg | INTRAMUSCULAR | Status: DC | PRN

## 2021-12-19 MED ORDER — FLUTICASONE PROPIONATE 50 MCG/ACT NA SUSP: 1.0000 | Freq: Every day | NASAL | Status: DC | PRN

## 2021-12-19 MED ORDER — HYDROMORPHONE HCL 1 MG/ML IJ SOLN
INTRAMUSCULAR | Status: AC
  Filled 2021-12-19: qty 0.5

## 2021-12-19 MED ORDER — PROPOFOL 10 MG/ML IV BOLUS
INTRAVENOUS | Status: AC
  Filled 2021-12-19: qty 20

## 2021-12-19 MED ORDER — HYDROMORPHONE HCL 1 MG/ML IJ SOLN
0.2500 mg | INTRAMUSCULAR | Status: DC | PRN
  Administered 2021-12-19 (×2): 0.5 mg via INTRAVENOUS

## 2021-12-19 MED ORDER — LIDOCAINE 2% (20 MG/ML) 5 ML SYRINGE
INTRAMUSCULAR | Status: DC | PRN
  Administered 2021-12-19: 100 mg via INTRAVENOUS

## 2021-12-19 MED ORDER — KETAMINE HCL 10 MG/ML IJ SOLN
INTRAMUSCULAR | Status: DC | PRN
  Administered 2021-12-19 (×3): 10 mg via INTRAVENOUS

## 2021-12-19 MED ORDER — ACETAMINOPHEN 650 MG RE SUPP: 650.0000 mg | RECTAL | Status: DC | PRN

## 2021-12-19 MED ORDER — SUGAMMADEX SODIUM 500 MG/5ML IV SOLN
INTRAVENOUS | Status: AC
  Filled 2021-12-19: qty 5

## 2021-12-19 MED ORDER — PHENYLEPHRINE 80 MCG/ML (10ML) SYRINGE FOR IV PUSH (FOR BLOOD PRESSURE SUPPORT)
PREFILLED_SYRINGE | INTRAVENOUS | Status: AC
  Filled 2021-12-19: qty 10

## 2021-12-19 MED ORDER — HYDROMORPHONE HCL 1 MG/ML IJ SOLN
INTRAMUSCULAR | Status: DC | PRN
  Administered 2021-12-19: .5 mg via INTRAVENOUS

## 2021-12-19 MED ORDER — HYDRALAZINE HCL 50 MG PO TABS
25.0000 mg | ORAL_TABLET | Freq: Three times a day (TID) | ORAL | Status: DC
  Administered 2021-12-19 – 2021-12-20 (×2): 25 mg via ORAL
  Filled 2021-12-19 (×2): qty 1

## 2021-12-19 MED ORDER — SEMAGLUTIDE 7 MG PO TABS: 7.0000 mg | ORAL_TABLET | Freq: Every day | ORAL | Status: DC

## 2021-12-19 MED ORDER — OXYCODONE HCL 5 MG PO TABS
5.0000 mg | ORAL_TABLET | ORAL | Status: DC | PRN
  Administered 2021-12-19 – 2021-12-21 (×4): 5 mg via ORAL
  Filled 2021-12-19 (×4): qty 1

## 2021-12-19 MED ORDER — CYCLOBENZAPRINE HCL 10 MG PO TABS
10.0000 mg | ORAL_TABLET | Freq: Three times a day (TID) | ORAL | Status: DC | PRN
  Administered 2021-12-19 – 2021-12-20 (×2): 10 mg via ORAL
  Filled 2021-12-19 (×2): qty 1

## 2021-12-19 MED ORDER — PROPOFOL 10 MG/ML IV BOLUS
INTRAVENOUS | Status: DC | PRN
  Administered 2021-12-19: 160 mg via INTRAVENOUS

## 2021-12-19 MED ORDER — PROMETHAZINE HCL 25 MG/ML IJ SOLN: 6.2500 mg | INTRAMUSCULAR | Status: DC | PRN

## 2021-12-19 MED ORDER — ROCURONIUM BROMIDE 10 MG/ML (PF) SYRINGE
PREFILLED_SYRINGE | INTRAVENOUS | Status: DC | PRN
  Administered 2021-12-19: 30 mg via INTRAVENOUS
  Administered 2021-12-19: 70 mg via INTRAVENOUS
  Administered 2021-12-19: 20 mg via INTRAVENOUS

## 2021-12-19 MED ORDER — LIDOCAINE-EPINEPHRINE 1 %-1:100000 IJ SOLN
INTRAMUSCULAR | Status: AC
  Filled 2021-12-19: qty 1

## 2021-12-19 MED ORDER — PHENYLEPHRINE HCL-NACL 20-0.9 MG/250ML-% IV SOLN
INTRAVENOUS | Status: DC | PRN
  Administered 2021-12-19: 30 ug/min via INTRAVENOUS

## 2021-12-19 MED ORDER — AMLODIPINE BESYLATE 5 MG PO TABS: 10.0000 mg | ORAL_TABLET | Freq: Every day | ORAL | Status: DC

## 2021-12-19 MED ORDER — LIDOCAINE 2% (20 MG/ML) 5 ML SYRINGE
INTRAMUSCULAR | Status: AC
  Filled 2021-12-19: qty 5

## 2021-12-19 MED ORDER — CEFAZOLIN IN SODIUM CHLORIDE 3-0.9 GM/100ML-% IV SOLN
3.0000 g | INTRAVENOUS | Status: AC
  Administered 2021-12-19 (×2): 3 g via INTRAVENOUS
  Filled 2021-12-19: qty 100

## 2021-12-19 MED ORDER — GABAPENTIN 300 MG PO CAPS
300.0000 mg | ORAL_CAPSULE | Freq: Once | ORAL | Status: AC
  Administered 2021-12-19: 300 mg via ORAL
  Filled 2021-12-19: qty 1

## 2021-12-19 MED ORDER — METOPROLOL SUCCINATE ER 100 MG PO TB24: 100.0000 mg | ORAL_TABLET | Freq: Every day | ORAL | Status: DC

## 2021-12-19 MED ORDER — SODIUM CHLORIDE 0.9% FLUSH: 3.0000 mL | INTRAVENOUS | Status: DC | PRN

## 2021-12-19 SURGICAL SUPPLY — 78 items
BAG COUNTER SPONGE SURGICOUNT (BAG) ×1 IMPLANT
BAND RUBBER #18 3X1/16 STRL (MISCELLANEOUS) ×2 IMPLANT
BASKET BONE COLLECTION (BASKET) ×1 IMPLANT
BENZOIN TINCTURE PRP APPL 2/3 (GAUZE/BANDAGES/DRESSINGS) IMPLANT
BLADE CLIPPER SURG (BLADE) IMPLANT
BLADE SURG 11 STRL SS (BLADE) ×1 IMPLANT
BUR 14 MATCH 3 (BUR) IMPLANT
BUR MATCHSTICK NEURO 3.0 LAGG (BURR) ×1 IMPLANT
BUR MR8 14CM BALL SYMTRI 5 (BUR) IMPLANT
BUR PRECISION FLUTE 5.0 (BURR) ×1 IMPLANT
BURR 14 MATCH 3 (BUR)
BURR MR8 14CM BALL SYMTRI 5 (BUR) ×1
CAGE EXP CATALYFT 9 (Plate) IMPLANT
CAGE INTERBODY PL SHT 7X22.5 (Plate) IMPLANT
CANISTER SUCT 3000ML PPV (MISCELLANEOUS) ×1 IMPLANT
CNTNR URN SCR LID CUP LEK RST (MISCELLANEOUS) ×1 IMPLANT
CONT SPEC 4OZ STRL OR WHT (MISCELLANEOUS) ×1
COVER BACK TABLE 60X90IN (DRAPES) ×1 IMPLANT
COVERAGE SUPPORT O-ARM STEALTH (MISCELLANEOUS) ×1 IMPLANT
DERMABOND ADVANCED .7 DNX12 (GAUZE/BANDAGES/DRESSINGS) ×1 IMPLANT
DRAPE C-ARM 42X72 X-RAY (DRAPES) IMPLANT
DRAPE C-ARMOR (DRAPES) IMPLANT
DRAPE LAPAROTOMY 100X72X124 (DRAPES) ×1 IMPLANT
DRAPE MICROSCOPE SLANT 54X150 (MISCELLANEOUS) ×1 IMPLANT
DRAPE SHEET LG 3/4 BI-LAMINATE (DRAPES) ×4 IMPLANT
DRAPE SURG 17X23 STRL (DRAPES) ×1 IMPLANT
DURAPREP 26ML APPLICATOR (WOUND CARE) ×1 IMPLANT
ELECT REM PT RETURN 9FT ADLT (ELECTROSURGICAL) ×1
ELECTRODE REM PT RTRN 9FT ADLT (ELECTROSURGICAL) ×1 IMPLANT
FEE COVERAGE SUPPORT O-ARM (MISCELLANEOUS) ×1 IMPLANT
GAUZE 4X4 16PLY ~~LOC~~+RFID DBL (SPONGE) IMPLANT
GAUZE SPONGE 4X4 12PLY STRL (GAUZE/BANDAGES/DRESSINGS) IMPLANT
GLOVE BIOGEL PI IND STRL 7.5 (GLOVE) ×2 IMPLANT
GLOVE ECLIPSE 7.5 STRL STRAW (GLOVE) ×2 IMPLANT
GLOVE EXAM NITRILE LRG STRL (GLOVE) IMPLANT
GLOVE EXAM NITRILE XL STR (GLOVE) IMPLANT
GLOVE EXAM NITRILE XS STR PU (GLOVE) IMPLANT
GOWN STRL REUS W/ TWL LRG LVL3 (GOWN DISPOSABLE) ×4 IMPLANT
GOWN STRL REUS W/ TWL XL LVL3 (GOWN DISPOSABLE) IMPLANT
GOWN STRL REUS W/TWL 2XL LVL3 (GOWN DISPOSABLE) IMPLANT
GOWN STRL REUS W/TWL LRG LVL3 (GOWN DISPOSABLE) ×4
GOWN STRL REUS W/TWL XL LVL3 (GOWN DISPOSABLE)
HEMOSTAT POWDER KIT SURGIFOAM (HEMOSTASIS) ×1 IMPLANT
KIT BASIN OR (CUSTOM PROCEDURE TRAY) ×1 IMPLANT
KIT INFUSE SMALL (Orthopedic Implant) IMPLANT
KIT POSITION SURG JACKSON T1 (MISCELLANEOUS) ×1 IMPLANT
KIT TURNOVER KIT B (KITS) ×1 IMPLANT
MARKER SPHERE PSV REFLC NDI (MISCELLANEOUS) ×5 IMPLANT
MILL BONE PREP (MISCELLANEOUS) IMPLANT
NDL HYPO 18GX1.5 BLUNT FILL (NEEDLE) IMPLANT
NDL SPNL 18GX3.5 QUINCKE PK (NEEDLE) IMPLANT
NEEDLE HYPO 18GX1.5 BLUNT FILL (NEEDLE) IMPLANT
NEEDLE HYPO 22GX1.5 SAFETY (NEEDLE) ×1 IMPLANT
NEEDLE SPNL 18GX3.5 QUINCKE PK (NEEDLE) IMPLANT
NS IRRIG 1000ML POUR BTL (IV SOLUTION) ×1 IMPLANT
PACK LAMINECTOMY NEURO (CUSTOM PROCEDURE TRAY) ×1 IMPLANT
PAD ARMBOARD 7.5X6 YLW CONV (MISCELLANEOUS) ×3 IMPLANT
PIN BONE FIX 150 (PIN) IMPLANT
ROD PED SOLERA 5.5X60 (Rod) IMPLANT
SCREW MAS/SET HEAD FOR 5.5 ROD (Screw) IMPLANT
SCREW MAS/SET STERILE 4PK (Screw) IMPLANT
SCREW SHANK NANO 7.5X35 (Screw) IMPLANT
SCREW SHANK NL 6.5X40 (Screw) IMPLANT
SCREW SHANK NL TITAN 6.5X45 (Screw) IMPLANT
SPIKE FLUID TRANSFER (MISCELLANEOUS) ×1 IMPLANT
SPONGE SURGIFOAM ABS GEL 100 (HEMOSTASIS) IMPLANT
SPONGE T-LAP 4X18 ~~LOC~~+RFID (SPONGE) IMPLANT
STRIP CLOSURE SKIN 1/2X4 (GAUZE/BANDAGES/DRESSINGS) IMPLANT
SUT MNCRL AB 3-0 PS2 18 (SUTURE) ×1 IMPLANT
SUT VIC AB 0 CT1 18XCR BRD8 (SUTURE) ×1 IMPLANT
SUT VIC AB 0 CT1 8-18 (SUTURE) ×2
SUT VIC AB 2-0 CP2 18 (SUTURE) ×1 IMPLANT
SYR 3ML LL SCALE MARK (SYRINGE) IMPLANT
TOWEL GREEN STERILE (TOWEL DISPOSABLE) ×1 IMPLANT
TOWEL GREEN STERILE FF (TOWEL DISPOSABLE) ×1 IMPLANT
TRAY FOLEY MTR SLVR 16FR STAT (SET/KITS/TRAYS/PACK) ×1 IMPLANT
TUBE CONNECTING 12X1/4 (SUCTIONS) IMPLANT
WATER STERILE IRR 1000ML POUR (IV SOLUTION) ×1 IMPLANT

## 2021-12-19 NOTE — Anesthesia Procedure Notes (Signed)
Procedure Name: Intubation Date/Time: 12/19/2021 12:45 PM  Performed by: Maude Leriche, CRNAPre-anesthesia Checklist: Patient identified, Emergency Drugs available, Suction available and Patient being monitored Patient Re-evaluated:Patient Re-evaluated prior to induction Oxygen Delivery Method: Circle system utilized Preoxygenation: Pre-oxygenation with 100% oxygen Induction Type: IV induction Ventilation: Mask ventilation without difficulty Laryngoscope Size: Miller and 2 Grade View: Grade I Tube type: Oral Tube size: 7.0 mm Number of attempts: 1 Airway Equipment and Method: Stylet and Bite block Placement Confirmation: ETT inserted through vocal cords under direct vision, positive ETCO2 and breath sounds checked- equal and bilateral Secured at: 22 cm Tube secured with: Tape Dental Injury: Teeth and Oropharynx as per pre-operative assessment

## 2021-12-19 NOTE — Op Note (Signed)
PATIENT: Kelly Farley  DAY OF SURGERY: 12/19/21   PRE-OPERATIVE DIAGNOSIS:  Lumbar stenosis, lumbar radiculopathy, lumbar spondylolisthesis   POST-OPERATIVE DIAGNOSIS:  Same   PROCEDURE:  L4-5, L5-S1 laminectomy, transforaminal lumbar interbody fusion, and posterolateral instrumented fusion   SURGEON:  Surgeon(s) and Role:    Judith Part, MD - Primary    Dawley, Troy DO - Assisting   ANESTHESIA: ETGA   BRIEF HISTORY: This is a 68 woman who presented with back and leg pain. Workup showed a mobile spondylolisthesis as well as large disc herniation and stenosis. I recommended L4-5/L5-S1 open decompression, TLIF, and posterolateral fusion. This was discussed with the patient as well as risks, benefits, and alternatives and wished to proceed with surgery.   OPERATIVE DETAIL: The patient was taken to the operating room and anesthesia was induced by the anesthesia team. They were placed on the OR table in the prone position with padding of all pressure points. A formal time out was performed with two patient identifiers and confirmed the operative site. The operative site was marked, hair was clipped with surgical clippers, the area was then prepped and draped in a sterile fashion. Fluoro was used to localize the operative level and a midline incision was placed to expose from L4 to S1. Subperiosteal dissection was performed bilaterally and fluoroscopy was again used to confirm the surgical level.   A spinous process clamp was applied and secured, followed by attachment of a reference array. The field was draped and the O-arm was brought into the field. An intra-op CT was performed, sent to the Stealth navigation station, registered to the patient's anatomy, and confirmed with landmarks with acceptable fit. Stereotactic spinal navigation was utilized throughout the procedure for planning and placement of pedicle screw trajectories.  Instrumentation was then performed. Fluoroscopy was used to  guide placement of bilateral pedicle screws (Medtronic) at L4, L5, and S1. These were placed with navigated instruments by localizing the pedicle, drilling a pilot hole, cannulating the pedicle with an awl-tap, palpating for pedicle wall breaches, and then placing the screw.   On the left at L4-5 and L5-S1, TLIFs were then performed. These were performed by a complete facetectomy, identifying the exiting root and thecal sac, then performing a discectomy and prepping the endplates, placing bone into the interspace, and placing a navigated titanium expandable interbody device (Medtronic). The same technique was performed at both levels except at L5-S1 a pedicle based retractor was used contralaterally given how collapsed the disc space was.  A cross-table showed hardware grossly in good position, but very difficult to see diagnostic detail due to the patient's body habitus. The spinous process clamp was removed and decompression was performed, which consisted of decompressive laminectomies at L4-5 and L5-S1. These were performed with a combination of high speed drill and rongeurs.   The screws were then connected with rods bilaterally and final tightened according to manufacturer torque specifications. The bone on the right was thoroughly decorticated over the posterolateral fusion surface and the previously resected bone fragments were morselized and used as autograft. rBMP was used to augment the autograft as well.   All instrument and sponge counts were correct, the incision was then closed in layers. The patient was then returned to anesthesia for emergence. No apparent complications at the completion of the procedure.   EBL:  482m   DRAINS: none   SPECIMENS: none   TJudith Part MD 12/19/21 12:11 PM

## 2021-12-19 NOTE — Anesthesia Preprocedure Evaluation (Addendum)
Anesthesia Evaluation  Patient identified by MRN, date of birth, ID band Patient awake    Reviewed: Allergy & Precautions, NPO status , Patient's Chart, lab work & pertinent test results, reviewed documented beta blocker date and time   Airway Mallampati: II  TM Distance: >3 FB Neck ROM: Full    Dental  (+) Dental Advisory Given, Teeth Intact   Pulmonary sleep apnea and Continuous Positive Airway Pressure Ventilation    Pulmonary exam normal breath sounds clear to auscultation       Cardiovascular hypertension, Pt. on medications and Pt. on home beta blockers Normal cardiovascular exam Rhythm:Regular Rate:Normal     Neuro/Psych negative neurological ROS     GI/Hepatic Neg liver ROS,GERD  ,,  Endo/Other  diabetes, Type 2  Morbid obesity  Renal/GU Renal Insufficiency and CRFRenal disease     Musculoskeletal  (+) Arthritis ,    Abdominal  (+) + obese  Peds  Hematology negative hematology ROS (+)   Anesthesia Other Findings   Reproductive/Obstetrics                             Anesthesia Physical Anesthesia Plan  ASA: 3  Anesthesia Plan: General   Post-op Pain Management: Tylenol PO (pre-op)* and Gabapentin PO (pre-op)*   Induction: Intravenous  PONV Risk Score and Plan: 4 or greater and Ondansetron, Treatment may vary due to age or medical condition, Dexamethasone and Propofol infusion  Airway Management Planned: Oral ETT  Additional Equipment: None  Intra-op Plan:   Post-operative Plan: Extubation in OR  Informed Consent: I have reviewed the patients History and Physical, chart, labs and discussed the procedure including the risks, benefits and alternatives for the proposed anesthesia with the patient or authorized representative who has indicated his/her understanding and acceptance.     Dental advisory given  Plan Discussed with: CRNA  Anesthesia Plan Comments:          Anesthesia Quick Evaluation

## 2021-12-19 NOTE — H&P (Signed)
Surgical H&P Update  HPI: 66 y.o. with a history of bilateral lower extremity and low back pain. Workup showed L4-5 mobile spondy, 5-1 large HNP. No changes in health since they were last seen. Still having the above and wishes to proceed with surgery.  PMHx:  Past Medical History:  Diagnosis Date   Arthritis    Diabetes mellitus without complication (HCC)    Hyperlipemia    Hypertension    Palpitations    wore holter monitor 30 years ago; murmur no longer an issue per patient   Sleep apnea    FamHx:  Family History  Problem Relation Age of Onset   Colon cancer Mother    Breast cancer Maternal Aunt    SocHx:  reports that she has never smoked. She does not have any smokeless tobacco history on file. She reports that she does not drink alcohol and does not use drugs.  Physical Exam: Strength 5/5 x4 and SILTx4 except right foot numbness  Assesment/Plan: 66 y.o. woman with back and leg pain, here for open decompression and instrumented fusion. Risks, benefits, and alternatives discussed and the patient would like to continue with surgery.  -OR today -3C post-op  Judith Part, MD 12/19/21 12:07 PM

## 2021-12-19 NOTE — Transfer of Care (Signed)
Immediate Anesthesia Transfer of Care Note  Patient: Imogen Maddalena  Procedure(s) Performed: Lumbar Four- Five Lumbar Five-Sacral One Open laminectomy/Transforaminal Lumbar Interbody Fusion/Posterolateral instrumented fusion Application of O-Arm  Patient Location: PACU  Anesthesia Type:General  Level of Consciousness: awake, alert , and oriented  Airway & Oxygen Therapy: Patient Spontanous Breathing and Patient connected to face mask oxygen  Post-op Assessment: Report given to RN, Post -op Vital signs reviewed and stable, Patient moving all extremities X 4, and Patient able to stick tongue midline  Post vital signs: Reviewed  Last Vitals:  Vitals Value Taken Time  BP 114/41 12/19/21 1700  Temp 97.7   Pulse 80 12/19/21 1701  Resp 16 12/19/21 1701  SpO2 99 % 12/19/21 1701  Vitals shown include unvalidated device data.  Last Pain:  Vitals:   12/19/21 1024  TempSrc:   PainSc: 6          Complications: No notable events documented.

## 2021-12-20 LAB — GLUCOSE, CAPILLARY
Glucose-Capillary: 138 mg/dL — ABNORMAL HIGH (ref 70–99)
Glucose-Capillary: 142 mg/dL — ABNORMAL HIGH (ref 70–99)
Glucose-Capillary: 80 mg/dL (ref 70–99)
Glucose-Capillary: 156 mg/dL — ABNORMAL HIGH (ref 70–99)

## 2021-12-20 NOTE — Progress Notes (Signed)
Neurosurgery Service Progress Note  Subjective: No acute events overnight, had some mild orthostasis this morning with PT but standing when I was rounding without any symptoms, radicular pain is completely resolved and R foot numbness is improving, she's happy about that    Objective: Vitals:   12/19/21 1927 12/19/21 2307 12/20/21 0427 12/20/21 0727  BP: 122/66 132/72 124/70 (!) 108/50  Pulse: 72 77 70 65  Resp: '20 20 20 16  '$ Temp: 98.1 F (36.7 C) 98.2 F (36.8 C) 97.6 F (36.4 C) 97.7 F (36.5 C)  TempSrc: Oral Oral Oral Oral  SpO2: 100% 97% 98% 100%  Weight:      Height:        Physical Exam: Strength 5/5 x4 and SILTx4   Assessment & Plan: 66 y.o. woman s/p open 2 level decompression and TLIF/PLF, recovering well.  -will see how she does throughout the day, possible discharge today if she feels up to it, otherwise likely tomorrow -activity as tolerated -PT/OT recs  Judith Part  12/20/21 9:49 AM

## 2021-12-20 NOTE — Anesthesia Postprocedure Evaluation (Signed)
Anesthesia Post Note  Patient: Kelly Farley  Procedure(s) Performed: Lumbar Four- Five Lumbar Five-Sacral One Open laminectomy/Transforaminal Lumbar Interbody Fusion/Posterolateral instrumented fusion Application of O-Arm     Patient location during evaluation: PACU Anesthesia Type: General Level of consciousness: sedated and patient cooperative Pain management: pain level controlled Vital Signs Assessment: post-procedure vital signs reviewed and stable Respiratory status: spontaneous breathing Cardiovascular status: stable Anesthetic complications: no   No notable events documented.  Last Vitals:  Vitals:   12/20/21 0427 12/20/21 0727  BP: 124/70 (!) 108/50  Pulse: 70 65  Resp: 20 16  Temp: 36.4 C 36.5 C  SpO2: 98% 100%    Last Pain:  Vitals:   12/20/21 0727  TempSrc: Oral  PainSc:                  Nolon Nations

## 2021-12-20 NOTE — Evaluation (Addendum)
Physical Therapy Evaluation Patient Details Name: Kelly Farley MRN: [id number] DOB: [birth year] Today's Date: 12/20/2021  History of Present Illness  Patient is a 66 yo female presenting to the hospital with lumbar stenosis, lumbar radiculopathy,  and lumbar spondylolisthesis. Patient undergoing L4-L5 and L5-S1 laminectomy and spinal fusion on 12/19/21. PMH: DM2, GERD, renal insufficiency, and obesity   Clinical Impression  Patient is s/p above surgery resulting in the deficits listed below (see PT Problem List). PTA pt lived alone, I/mod I using cane for ambulation. On eval, pt required supervision bed mobility, min guard assist transfers, min guard assist ambulation 79' with RW, and min assist ascend/descend 3 steps with rails. Mobility limited by +orthostatic BP.  Patient will benefit from skilled PT to increase their independence and safety with mobility (while adhering to their precautions) to allow discharge home.        Recommendations for follow up therapy are one component of a multi-disciplinary discharge planning process, led by the attending physician.  Recommendations may be updated based on patient status, additional functional criteria and insurance authorization.  Follow Up Recommendations Home health PT      Assistance Recommended at Discharge PRN  Patient can return home with the following  Assist for transportation;Assistance with cooking/housework;Help with stairs or ramp for entrance    Equipment Recommendations None recommended by PT  Recommendations for Other Services       Functional Status Assessment Patient has had a recent decline in their functional status and demonstrates the ability to make significant improvements in function in a reasonable and predictable amount of time.     Precautions / Restrictions Precautions Precautions: Back Precaution Comments: Educated on 3/3 back precautions. Handout provided.      Mobility  Bed Mobility Overal bed  mobility: Needs Assistance Bed Mobility: Sit to Sidelying, Rolling Rolling: Modified independent (Device/Increase time)       Sit to sidelying: Supervision General bed mobility comments: +rail, increased time, cues for sequencing    Transfers Overall transfer level: Needs assistance Equipment used: Rolling walker (2 wheels) Transfers: Sit to/from Stand Sit to Stand: Min guard           General transfer comment: increased time    Ambulation/Gait Ambulation/Gait assistance: Min guard Gait Distance (Feet): 75 Feet Assistive device: Rolling walker (2 wheels) Gait Pattern/deviations: Step-through pattern, Decreased stride length Gait velocity: decreased Gait velocity interpretation: <1.31 ft/sec, indicative of household ambulator   General Gait Details: Pt with c/o feeling lightheaded after completing stair training requiring return to room in w/c. After 2-3 minute seated rest break, BP 96/74  Stairs Stairs: Yes Stairs assistance: Min assist Stair Management: Two rails, Step to pattern, Forwards, Sideways, One rail Left Number of Stairs: 3 General stair comments: ascent forward with 2 rails, descent sideways with L rail  Wheelchair Mobility    Modified Rankin (Stroke Patients Only)       Balance Overall balance assessment: Mild deficits observed, not formally tested                                           Pertinent Vitals/Pain Pain Assessment Pain Assessment: 0-10 Pain Score: 3  Pain Location: back Pain Descriptors / Indicators: Discomfort, Operative site guarding Pain Intervention(s): Monitored during session, Repositioned, Premedicated before session    Home Living Family/patient expects to be discharged to:: Private residence Living Arrangements: Alone Available Help at Discharge: Family  Type of Home: House Home Access: Stairs to enter Entrance Stairs-Rails: Left;Right;Can reach both Entrance Stairs-Number of Steps: 3   Home  Layout: One level Home Equipment: Conservation officer, nature (2 wheels);Rollator (4 wheels);Cane - single point;Shower seat      Prior Function Prior Level of Function : Independent/Modified Independent;Driving             Mobility Comments: cane at baseline       Hand Dominance        Extremity/Trunk Assessment   Upper Extremity Assessment Upper Extremity Assessment: Defer to OT evaluation    Lower Extremity Assessment Lower Extremity Assessment: Generalized weakness    Cervical / Trunk Assessment Cervical / Trunk Assessment: Back Surgery  Communication   Communication: No difficulties  Cognition Arousal/Alertness: Awake/alert Behavior During Therapy: WFL for tasks assessed/performed Overall Cognitive Status: Within Functional Limits for tasks assessed                                          General Comments General comments (skin integrity, edema, etc.): BP 108/50 sitting EOB prior to ambulation. Pt with c/o feeling lightheaded and weak during gait requiring w/c return to room. BP 96/74 in w/c after 2-3 minute seated rest break.    Exercises     Assessment/Plan    PT Assessment Patient needs continued PT services  PT Problem List Decreased strength;Decreased balance;Pain;Decreased mobility;Decreased activity tolerance;Obesity       PT Treatment Interventions DME instruction;Functional mobility training;Balance training;Patient/family education;Gait training;Therapeutic activities;Stair training    PT Goals (Current goals can be found in the Care Plan section)  Acute Rehab PT Goals Patient Stated Goal: home PT Goal Formulation: With patient Time For Goal Achievement: 12/27/21 Potential to Achieve Goals: Good    Frequency Min 5X/week     Co-evaluation               AM-PAC PT "6 Clicks" Mobility  Outcome Measure Help needed turning from your back to your side while in a flat bed without using bedrails?: None Help needed moving from  lying on your back to sitting on the side of a flat bed without using bedrails?: A Little Help needed moving to and from a bed to a chair (including a wheelchair)?: A Little Help needed standing up from a chair using your arms (e.g., wheelchair or bedside chair)?: A Little Help needed to walk in hospital room?: A Little Help needed climbing 3-5 steps with a railing? : A Little 6 Click Score: 19    End of Session Equipment Utilized During Treatment: Gait belt Activity Tolerance: Treatment limited secondary to medical complications (Comment) (+orthostatics) Patient left: in bed;with call bell/phone within reach Nurse Communication: Mobility status PT Visit Diagnosis: Unsteadiness on feet (R26.81);Difficulty in walking, not elsewhere classified (R26.2);Pain    Time: 7989-2119 PT Time Calculation (min) (ACUTE ONLY): 29 min   Charges:   PT Evaluation $PT Eval Moderate Complexity: 1 Mod PT Treatments $Gait Training: 8-22 mins        Lorrin Goodell, PT  Office # [phone] Pager [phone]   Lorriane Shire 12/20/2021, 10:11 AM

## 2021-12-20 NOTE — Evaluation (Signed)
Occupational Therapy Evaluation Patient Details Name: Kelly Farley MRN: [id number] DOB: 1955-11-08 Today's Date: 12/20/2021   History of Present Illness Patient is a 66 yo female presenting to the hospital with lumbar stenosis, lumbar radiculopathy,  and lumbar spondylolisthesis. Patient undergoing L4-L5 and L5-S1 laminectomy and spinal fusion on 12/19/21. PMH: DM2, GERD, renal insufficiency, and obesity   Clinical Impression   Prior to this admission, patient independent with ADLs, and ambulating with a RW. Currently, patient presenting with significant back pain and positive orthostatics, reporting lightheadedness throughout evaluation. Patient mod A for ADLs, and min guard for transfers. Patient requiring significant increased time for transfers, cues for RW management, often having to pause and re-attempt to due back spasms. OT recommending HHOT at discharge, and will continue to follow acutely. OT will continue to emphasize ADL education as patient was unable to tolerate due to pain and orthostatics this session.   149/81 (99) in supine  115/56 (74) sitting EOB  113/62 (74) in standing  102/61 (74) after minimal ambulation around EOB     Recommendations for follow up therapy are one component of a multi-disciplinary discharge planning process, led by the attending physician.  Recommendations may be updated based on patient status, additional functional criteria and insurance authorization.   Follow Up Recommendations  Home health OT     Assistance Recommended at Discharge Frequent or constant Supervision/Assistance  Patient can return home with the following A little help with walking and/or transfers;A lot of help with bathing/dressing/bathroom;Assistance with cooking/housework;Assist for transportation;Help with stairs or ramp for entrance    Functional Status Assessment  Patient has had a recent decline in their functional status and demonstrates the ability to make significant  improvements in function in a reasonable and predictable amount of time.  Equipment Recommendations  None recommended by OT    Recommendations for Other Services       Precautions / Restrictions Precautions Precautions: Back Precaution Booklet Issued: Yes (comment) Precaution Comments: Educated on 3/3 back precautions. Handout provided. Restrictions Weight Bearing Restrictions: No      Mobility Bed Mobility Overal bed mobility: Needs Assistance Bed Mobility: Sit to Sidelying, Rolling Rolling: Min guard       Sit to sidelying: Min guard General bed mobility comments: heavy use of the rail, significant increased time, cues for sequencing    Transfers Overall transfer level: Needs assistance Equipment used: Rolling walker (2 wheels) Transfers: Sit to/from Stand Sit to Stand: Min guard           General transfer comment: significant increased time, cues for RW management, often having to pause and re-attempt to due back spasms      Balance Overall balance assessment: Mild deficits observed, not formally tested                                         ADL either performed or assessed with clinical judgement   ADL Overall ADL's : Needs assistance/impaired Eating/Feeding: Set up;Sitting   Grooming: Set up;Sitting   Upper Body Bathing: Minimal assistance;Sitting   Lower Body Bathing: Maximal assistance;Sitting/lateral leans;Sit to/from stand   Upper Body Dressing : Minimal assistance;Sitting   Lower Body Dressing: Maximal assistance;Sitting/lateral leans;Sit to/from stand;Adhering to back precautions   Toilet Transfer: Minimal assistance;Ambulation;Rolling walker (2 wheels)   Toileting- Clothing Manipulation and Hygiene: Maximal assistance;Sitting/lateral lean;Sit to/from stand       Functional mobility during ADLs: Moderate assistance;Cueing  for sequencing;Cueing for safety;Rolling walker (2 wheels) General ADL Comments: Patient presenting  with significant back pain and positive orthostatics     Vision Baseline Vision/History: 1 Wears glasses Ability to See in Adequate Light: 0 Adequate Patient Visual Report: No change from baseline       Perception     Praxis      Pertinent Vitals/Pain Pain Assessment Pain Assessment: 0-10 Pain Score: 8  Pain Location: back Pain Descriptors / Indicators: Discomfort, Operative site guarding, Spasm Pain Intervention(s): Limited activity within patient's tolerance, Monitored during session, Premedicated before session, Repositioned     Hand Dominance     Extremity/Trunk Assessment Upper Extremity Assessment Upper Extremity Assessment: Generalized weakness   Lower Extremity Assessment Lower Extremity Assessment: Defer to PT evaluation   Cervical / Trunk Assessment Cervical / Trunk Assessment: Back Surgery   Communication Communication Communication: No difficulties   Cognition Arousal/Alertness: Lethargic Behavior During Therapy: WFL for tasks assessed/performed Overall Cognitive Status: Within Functional Limits for tasks assessed                                 General Comments: lethargic suspect due to medications     General Comments  Orthostatics taken: 149/81 (99) in supine 115/56 (74) sitting EOB 113/62 (74) in standing 102/61 (74) after minimal ambulation around EOB    Exercises     Shoulder Instructions      Home Living Family/patient expects to be discharged to:: Private residence Living Arrangements: Alone Available Help at Discharge: Family Type of Home: House Home Access: Stairs to enter Technical brewer of Steps: 3 Entrance Stairs-Rails: Left;Right;Can reach both Home Layout: One level     Bathroom Shower/Tub: Occupational psychologist: Handicapped height     Home Equipment: Conservation officer, nature (2 wheels);Rollator (4 wheels);Cane - single point;Shower seat          Prior Functioning/Environment Prior Level of  Function : Independent/Modified Independent;Driving             Mobility Comments: cane at baseline          OT Problem List: Decreased strength;Decreased range of motion;Decreased activity tolerance;Impaired balance (sitting and/or standing);Decreased coordination;Decreased safety awareness;Decreased knowledge of use of DME or AE;Decreased knowledge of precautions;Pain;Obesity      OT Treatment/Interventions: Self-care/ADL training;Therapeutic exercise;Energy conservation;DME and/or AE instruction;Manual therapy;Therapeutic activities;Patient/family education;Balance training    OT Goals(Current goals can be found in the care plan section) Acute Rehab OT Goals Patient Stated Goal: to be in less pain OT Goal Formulation: With patient Time For Goal Achievement: 01/03/22 Potential to Achieve Goals: Fair  OT Frequency: Min 2X/week    Co-evaluation              AM-PAC OT "6 Clicks" Daily Activity     Outcome Measure Help from another person eating meals?: A Little Help from another person taking care of personal grooming?: A Little Help from another person toileting, which includes using toliet, bedpan, or urinal?: A Lot Help from another person bathing (including washing, rinsing, drying)?: A Lot Help from another person to put on and taking off regular upper body clothing?: A Little Help from another person to put on and taking off regular lower body clothing?: A Lot 6 Click Score: 15   End of Session Equipment Utilized During Treatment: Gait belt;Rolling walker (2 wheels) Nurse Communication: Mobility status;Other (comment) (Orthostatics)  Activity Tolerance: Patient limited by lethargy;Patient limited by pain;Patient limited  by fatigue Patient left: in chair;with call bell/phone within reach  OT Visit Diagnosis: Unsteadiness on feet (R26.81);Muscle weakness (generalized) (M62.81);Pain;Dizziness and giddiness (R42);Other abnormalities of gait and mobility (R26.89) Pain  - part of body:  (Back)                Time: 1740-8144 OT Time Calculation (min): 25 min Charges:  OT General Charges $OT Visit: 1 Visit OT Evaluation $OT Eval Moderate Complexity: 1 Mod OT Treatments $Self Care/Home Management : 8-22 mins  Corinne Ports E. Kamill Fulbright, OTR/L Acute Rehabilitation Services [phone]   Ascencion Dike 12/20/2021, 10:27 AM

## 2021-12-20 NOTE — Progress Notes (Signed)
Patient declined the use of CPAP for the night  

## 2021-12-20 NOTE — Plan of Care (Signed)

## 2021-12-21 LAB — GLUCOSE, CAPILLARY: Glucose-Capillary: 122 mg/dL — ABNORMAL HIGH (ref 70–99)

## 2021-12-21 MED ORDER — CYCLOBENZAPRINE HCL 10 MG PO TABS: 10.0000 mg | ORAL_TABLET | Freq: Three times a day (TID) | ORAL | 0 refills | Status: DC | PRN

## 2021-12-21 MED ORDER — OXYCODONE HCL 5 MG PO TABS: 5.0000 mg | ORAL_TABLET | ORAL | 0 refills | Status: DC | PRN

## 2021-12-21 NOTE — Progress Notes (Signed)
Occupational Therapy Treatment Patient Details Name: Kelly Farley MRN: [id number] DOB: 05-05-1955 Today's Date: 12/21/2021   History of present illness Patient is a 66 yo female presenting to the hospital with lumbar stenosis, lumbar radiculopathy,  and lumbar spondylolisthesis. Patient undergoing L4-L5 and L5-S1 laminectomy and spinal fusion on 12/19/21. PMH: DM2, GERD, renal insufficiency, and obesity   OT comments  Patient received sitting on EOB and stating decreased pain and feeling better than yesterday. Patient demonstrated good progress with mobility and transfers. Patient able to stand at sink for grooming and demonstrated good return on AE use following demonstrations. Patient able to recall back precautions. Patient is expected to discharge home today with HHOT to further address LB dressing with AE.    Recommendations for follow up therapy are one component of a multi-disciplinary discharge planning process, led by the attending physician.  Recommendations may be updated based on patient status, additional functional criteria and insurance authorization.    Follow Up Recommendations  Home health OT     Assistance Recommended at Discharge Frequent or constant Supervision/Assistance  Patient can return home with the following  A little help with walking and/or transfers;A lot of help with bathing/dressing/bathroom;Assistance with cooking/housework;Assist for transportation;Help with stairs or ramp for entrance   Equipment Recommendations  None recommended by OT    Recommendations for Other Services      Precautions / Restrictions Precautions Precautions: Back Precaution Comments: able to recall back precautions Restrictions Weight Bearing Restrictions: No       Mobility Bed Mobility Overal bed mobility: Needs Assistance             General bed mobility comments: seated on EOB upon entry    Transfers Overall transfer level: Needs assistance Equipment used:  Rolling walker (2 wheels) Transfers: Sit to/from Stand, Bed to chair/wheelchair/BSC Sit to Stand: Min guard     Step pivot transfers: Supervision, Min guard     General transfer comment: min guard initially due to complaints of stiffness, progressed to supervision     Balance Overall balance assessment: Mild deficits observed, not formally tested                                         ADL either performed or assessed with clinical judgement   ADL Overall ADL's : Needs assistance/impaired     Grooming: Wash/dry hands;Wash/dry face;Oral care;Set up;Standing Grooming Details (indicate cue type and reason): at sink Upper Body Bathing: Supervision/ safety;Standing Upper Body Bathing Details (indicate cue type and reason): at sink Lower Body Bathing: Moderate assistance;Sitting/lateral leans Lower Body Bathing Details (indicate cue type and reason): assistance needed for below knees, educated on LH sponge Upper Body Dressing : Supervision/safety;Sitting   Lower Body Dressing: Minimal assistance;Adhering to hip precautions;With adaptive equipment;Sit to/from stand Lower Body Dressing Details (indicate cue type and reason): educated on adaptive equipment use for LB dressing with patient demonstrating good understanding Toilet Transfer: Supervision/safety;Ambulation;Regular Glass blower/designer Details (indicate cue type and reason): verbal cues for safety Toileting- Clothing Manipulation and Hygiene: Supervision/safety;Sitting/lateral lean Toileting - Clothing Manipulation Details (indicate cue type and reason): cues for back precautions       General ADL Comments: demonstrated good understanding of AE use for LB dressing    Extremity/Trunk Assessment              Vision       Perception     Praxis  Cognition Arousal/Alertness: Awake/alert Behavior During Therapy: WFL for tasks assessed/performed Overall Cognitive Status: Within Functional  Limits for tasks assessed                                 General Comments: reports feeling better        Exercises      Shoulder Instructions       General Comments instructions on AE and where to obtain    Pertinent Vitals/ Pain       Pain Assessment Pain Assessment: Faces Faces Pain Scale: Hurts little more Pain Location: back Pain Descriptors / Indicators: Discomfort, Operative site guarding, Spasm Pain Intervention(s): Monitored during session, Repositioned, Premedicated before session  Home Living                                          Prior Functioning/Environment              Frequency  Min 2X/week        Progress Toward Goals  OT Goals(current goals can now be found in the care plan section)  Progress towards OT goals: Progressing toward goals  Acute Rehab OT Goals Patient Stated Goal: go home OT Goal Formulation: With patient Time For Goal Achievement: 01/03/22 Potential to Achieve Goals: Fair ADL Goals Pt Will Perform Lower Body Bathing: with supervision;sit to/from stand;sitting/lateral leans Pt Will Perform Lower Body Dressing: with supervision;sitting/lateral leans;sit to/from stand Pt Will Transfer to Toilet: with supervision;ambulating Pt Will Perform Toileting - Clothing Manipulation and hygiene: with supervision  Plan Discharge plan remains appropriate    Co-evaluation                 AM-PAC OT "6 Clicks" Daily Activity     Outcome Measure   Help from another person eating meals?: None Help from another person taking care of personal grooming?: A Little Help from another person toileting, which includes using toliet, bedpan, or urinal?: A Little Help from another person bathing (including washing, rinsing, drying)?: A Little Help from another person to put on and taking off regular upper body clothing?: A Little Help from another person to put on and taking off regular lower body clothing?:  A Little 6 Click Score: 19    End of Session Equipment Utilized During Treatment: Rolling walker (2 wheels);Other (comment) (AE)  OT Visit Diagnosis: Unsteadiness on feet (R26.81);Muscle weakness (generalized) (M62.81);Pain;Dizziness and giddiness (R42);Other abnormalities of gait and mobility (R26.89)   Activity Tolerance Patient tolerated treatment well   Patient Left in chair;with call bell/phone within reach   Nurse Communication Mobility status        Time: 6734-1937 OT Time Calculation (min): 33 min  Charges: OT General Charges $OT Visit: 1 Visit OT Treatments $Self Care/Home Management : 23-37 mins  Lodema Hong, L[place]  Office [place] 12/21/2021, 8:25 AM

## 2021-12-21 NOTE — Progress Notes (Signed)
Physical Therapy Treatment Patient Details Name: Kelly Farley MRN: [id number] DOB: [birth year] Today's Date: 12/21/2021   History of Present Illness Patient is a 66 yo female presenting to the hospital with lumbar stenosis, lumbar radiculopathy,  and lumbar spondylolisthesis. Patient undergoing L4-L5 and L5-S1 laminectomy and spinal fusion on 12/19/21. PMH: DM2, GERD, renal insufficiency, and obesity    PT Comments    Pt progressing towards physical therapy goals. Was able to perform transfers and ambulation with gross min guard assist to min assist and RW for support. Pt with difficulty completing bed mobility as her bed is high at home and we adjusted bed height here to simulate home environment. Brother will be available to assist at home as needed. Reviewed precautions and car transfer. Will continue to follow and progress as able per POC.     Recommendations for follow up therapy are one component of a multi-disciplinary discharge planning process, led by the attending physician.  Recommendations may be updated based on patient status, additional functional criteria and insurance authorization.  Follow Up Recommendations  Home health PT     Assistance Recommended at Discharge PRN  Patient can return home with the following Assist for transportation;Assistance with cooking/housework;Help with stairs or ramp for entrance   Equipment Recommendations  None recommended by PT    Recommendations for Other Services       Precautions / Restrictions Precautions Precautions: Back Precaution Booklet Issued: Yes (comment) Precaution Comments: able to recall back precautions Restrictions Weight Bearing Restrictions: No     Mobility  Bed Mobility Overal bed mobility: Needs Assistance Bed Mobility: Sit to Sidelying, Rolling, Sidelying to Sit Rolling: Min guard Sidelying to sit: Min guard     Sit to sidelying: Min assist General bed mobility comments: VC's for optimal log roll  technique. Bed height elevated and HOB flat to simulate home environment.    Transfers Overall transfer level: Needs assistance Equipment used: Rolling walker (2 wheels) Transfers: Sit to/from Stand Sit to Stand: Min assist           General transfer comment: VC's for hand placement on seated surface for safety. Assist required for power up to full stand as pt unable to complete without hands on assist from therapist.    Ambulation/Gait Ambulation/Gait assistance: Min guard Gait Distance (Feet): 200 Feet Assistive device: Rolling walker (2 wheels) Gait Pattern/deviations: Step-through pattern, Decreased stride length Gait velocity: decreased Gait velocity interpretation: 1.31 - 2.62 ft/sec, indicative of limited community ambulator   General Gait Details: VC's throughout for improved posture, closer walker proximity, and forward gaze. No assist required but hands on guarding provided throughout gait training for optimal safety.   Stairs   Stairs assistance: Min assist Stair Management: Two rails, Step to pattern, Forwards, Sideways, One rail Left Number of Stairs: 3 General stair comments: ascent forward with 2 rails, descent sideways with L rail   Wheelchair Mobility    Modified Rankin (Stroke Patients Only)       Balance Overall balance assessment: Mild deficits observed, not formally tested                                          Cognition Arousal/Alertness: Awake/alert Behavior During Therapy: WFL for tasks assessed/performed Overall Cognitive Status: Within Functional Limits for tasks assessed  Exercises      General Comments General comments (skin integrity, edema, etc.): instructions on AE and where to obtain      Pertinent Vitals/Pain Pain Assessment Pain Assessment: Faces Faces Pain Scale: Hurts little more Pain Location: back Pain Descriptors / Indicators: Discomfort,  Operative site guarding, Spasm Pain Intervention(s): Limited activity within patient's tolerance, Monitored during session, Repositioned    Home Living                          Prior Function            PT Goals (current goals can now be found in the care plan section) Acute Rehab PT Goals Patient Stated Goal: home PT Goal Formulation: With patient Time For Goal Achievement: 12/27/21 Potential to Achieve Goals: Good Progress towards PT goals: Progressing toward goals    Frequency    Min 5X/week      PT Plan Current plan remains appropriate    Co-evaluation              AM-PAC PT "6 Clicks" Mobility   Outcome Measure  Help needed turning from your back to your side while in a flat bed without using bedrails?: A Little Help needed moving from lying on your back to sitting on the side of a flat bed without using bedrails?: A Little Help needed moving to and from a bed to a chair (including a wheelchair)?: A Little Help needed standing up from a chair using your arms (e.g., wheelchair or bedside chair)?: A Little Help needed to walk in hospital room?: A Little Help needed climbing 3-5 steps with a railing? : A Little 6 Click Score: 18    End of Session Equipment Utilized During Treatment: Gait belt Activity Tolerance: Treatment limited secondary to medical complications (Comment) (+orthostatics) Patient left: in bed;with call bell/phone within reach Nurse Communication: Mobility status PT Visit Diagnosis: Unsteadiness on feet (R26.81);Difficulty in walking, not elsewhere classified (R26.2);Pain Pain - part of body:  (back)     Time: 5956-3875 PT Time Calculation (min) (ACUTE ONLY): 23 min  Charges:  $Gait Training: 23-37 mins                     Rolinda Roan, PT, DPT Acute Rehabilitation Services Secure Chat Preferred Office: [phone]    Thelma Comp 12/21/2021, 11:13 AM

## 2021-12-21 NOTE — Progress Notes (Signed)
Neurosurgery Service Progress Note  Subjective: No acute events overnight, wants to go home today  Objective: Vitals:   12/20/21 2309 12/21/21 0007 12/21/21 0348 12/21/21 0821  BP: (!) 93/51 (!) 95/56 (!) 100/51 (!) 109/53  Pulse: 84 97 78 84  Resp: '18  20 16  '$ Temp: 99.1 F (37.3 C)  98.2 F (36.8 C) 98.9 F (37.2 C)  TempSrc: Oral  Oral   SpO2: 94%  99% 100%  Weight:      Height:        Physical Exam: Strength 5/5 x4 and SILTx4   Assessment & Plan: 66 y.o. woman s/p open 2 level decompression and TLIF/PLF, recovering well.  -discharge home today  Judith Part  12/21/21 9:29 AM

## 2021-12-21 NOTE — Discharge Summary (Signed)
Discharge Summary  Date of Admission: 12/19/2021  Date of Discharge: 12/21/21  Attending Physician: Emelda Brothers, MD  Hospital Course: Patient was admitted following an uncomplicated 2 level lami / fusion. They were recovered in PACU and transferred to [facility]. Their preop symptoms were improved, their hospital course was uncomplicated and the patient was discharged home. They will follow up in clinic with me in clinic in 2 weeks.  Neurologic exam at discharge:  Strength 5/5 x4 and SILTx4   Discharge diagnosis: Lumbar spondylolisthesis and stenosis  Judith Part, MD 12/21/21 9:31 AM

## 2021-12-21 NOTE — Discharge Instructions (Signed)
Wound Care Keep incision covered and dry for three days.   Do not put any creams, lotions, or ointments on incision.  The glue will falls off when its ready to fall out Activity Walk each and every day, increasing distance each day. No lifting greater than 5 lbs.  Avoid excessive back motion. No driving for 2 weeks; may ride as a passenger locally.  Diet Resume your normal diet.   Call Your Doctor If Any of These Occur Redness, drainage, or swelling at the wound.  Temperature greater than 101 degrees. Severe pain not relieved by pain medication. Incision starts to come apart. Follow Up Appt Call [phone])  for problems.  If you have any hardware placed in your spine, you will need an x-ray before your appointment.

## 2021-12-25 ENCOUNTER — Encounter (HOSPITAL_COMMUNITY): Payer: Self-pay | Admitting: Neurological Surgery

## 2022-01-11 DIAGNOSIS — Z6841 Body Mass Index (BMI) 40.0 and over, adult: Secondary | ICD-10-CM | POA: Diagnosis not present

## 2022-01-11 DIAGNOSIS — I10 Essential (primary) hypertension: Secondary | ICD-10-CM | POA: Diagnosis not present

## 2022-01-11 DIAGNOSIS — Z4789 Encounter for other orthopedic aftercare: Secondary | ICD-10-CM | POA: Diagnosis not present

## 2022-01-11 DIAGNOSIS — Z981 Arthrodesis status: Secondary | ICD-10-CM | POA: Diagnosis not present

## 2022-01-11 DIAGNOSIS — Z7984 Long term (current) use of oral hypoglycemic drugs: Secondary | ICD-10-CM | POA: Diagnosis not present

## 2022-01-11 DIAGNOSIS — E785 Hyperlipidemia, unspecified: Secondary | ICD-10-CM | POA: Diagnosis not present

## 2022-01-11 DIAGNOSIS — E668 Other obesity: Secondary | ICD-10-CM | POA: Diagnosis not present

## 2022-01-11 DIAGNOSIS — E119 Type 2 diabetes mellitus without complications: Secondary | ICD-10-CM | POA: Diagnosis not present

## 2022-01-11 DIAGNOSIS — G4730 Sleep apnea, unspecified: Secondary | ICD-10-CM | POA: Diagnosis not present

## 2022-02-02 DIAGNOSIS — J029 Acute pharyngitis, unspecified: Secondary | ICD-10-CM | POA: Diagnosis not present

## 2022-02-02 DIAGNOSIS — R070 Pain in throat: Secondary | ICD-10-CM | POA: Diagnosis not present

## 2022-02-21 DIAGNOSIS — M25562 Pain in left knee: Secondary | ICD-10-CM | POA: Diagnosis not present

## 2022-02-21 DIAGNOSIS — M170 Bilateral primary osteoarthritis of knee: Secondary | ICD-10-CM | POA: Diagnosis not present

## 2022-03-06 ENCOUNTER — Other Ambulatory Visit: Payer: Self-pay | Admitting: Internal Medicine

## 2022-03-06 DIAGNOSIS — N189 Chronic kidney disease, unspecified: Secondary | ICD-10-CM | POA: Diagnosis not present

## 2022-03-06 DIAGNOSIS — E119 Type 2 diabetes mellitus without complications: Secondary | ICD-10-CM | POA: Diagnosis not present

## 2022-03-07 LAB — COMPREHENSIVE METABOLIC PANEL
ALT: 16 IU/L (ref 0–32)
AST: 24 IU/L (ref 0–40)
Albumin/Globulin Ratio: 1.5 (ref 1.2–2.2)
Albumin: 4.2 g/dL (ref 3.9–4.9)
Alkaline Phosphatase: 97 IU/L (ref 44–121)
BUN/Creatinine Ratio: 18 (ref 12–28)
BUN: 23 mg/dL (ref 8–27)
Bilirubin Total: 0.3 mg/dL (ref 0.0–1.2)
CO2: 23 mmol/L (ref 20–29)
Calcium: 9.9 mg/dL (ref 8.7–10.3)
Chloride: 101 mmol/L (ref 96–106)
Creatinine, Ser: 1.3 mg/dL — ABNORMAL HIGH (ref 0.57–1.00)
Globulin, Total: 2.8 g/dL (ref 1.5–4.5)
Glucose: 130 mg/dL — ABNORMAL HIGH (ref 70–99)
Potassium: 4.1 mmol/L (ref 3.5–5.2)
Sodium: 141 mmol/L (ref 134–144)
Total Protein: 7 g/dL (ref 6.0–8.5)
eGFR: 45 mL/min/{1.73_m2} — ABNORMAL LOW (ref >59)

## 2022-03-07 LAB — LIPID PANEL W/O CHOL/HDL RATIO
Cholesterol, Total: 169 mg/dL (ref 100–199)
HDL: 63 mg/dL (ref >39)
LDL Chol Calc (NIH): 94 mg/dL (ref 0–99)
Triglycerides: 61 mg/dL (ref 0–149)
VLDL Cholesterol Cal: 12 mg/dL (ref 5–40)

## 2022-03-07 LAB — HGB A1C W/O EAG: Hgb A1c MFr Bld: 5.7 % — ABNORMAL HIGH (ref 4.8–5.6)

## 2022-03-10 ENCOUNTER — Other Ambulatory Visit: Payer: Self-pay | Admitting: Internal Medicine

## 2022-03-10 DIAGNOSIS — E119 Type 2 diabetes mellitus without complications: Secondary | ICD-10-CM

## 2022-03-11 ENCOUNTER — Other Ambulatory Visit: Payer: Self-pay | Admitting: Internal Medicine

## 2022-03-11 DIAGNOSIS — E785 Hyperlipidemia, unspecified: Secondary | ICD-10-CM

## 2022-03-14 ENCOUNTER — Ambulatory Visit
Admission: RE | Admit: 2022-03-14 | Discharge: 2022-03-14 | Disposition: A | Discharge status: Home / Self Care | Payer: PPO | Source: Ambulatory Visit | Attending: Internal Medicine | Admitting: Internal Medicine

## 2022-03-14 DIAGNOSIS — Z1231 Encounter for screening mammogram for malignant neoplasm of breast: Secondary | ICD-10-CM | POA: Insufficient documentation

## 2022-03-16 ENCOUNTER — Other Ambulatory Visit: Payer: Self-pay | Admitting: Internal Medicine

## 2022-03-16 DIAGNOSIS — R921 Mammographic calcification found on diagnostic imaging of breast: Secondary | ICD-10-CM

## 2022-03-16 DIAGNOSIS — R928 Other abnormal and inconclusive findings on diagnostic imaging of breast: Secondary | ICD-10-CM

## 2022-03-19 ENCOUNTER — Encounter: Payer: Self-pay | Admitting: Internal Medicine

## 2022-03-19 ENCOUNTER — Ambulatory Visit (INDEPENDENT_AMBULATORY_CARE_PROVIDER_SITE_OTHER): Payer: PPO | Admitting: Internal Medicine

## 2022-03-19 VITALS — BP 120/80 | HR 81 | Ht 67.5 in | Wt 280.0 lb

## 2022-03-19 DIAGNOSIS — E782 Mixed hyperlipidemia: Secondary | ICD-10-CM

## 2022-03-19 DIAGNOSIS — E119 Type 2 diabetes mellitus without complications: Secondary | ICD-10-CM

## 2022-03-19 DIAGNOSIS — I10 Essential (primary) hypertension: Secondary | ICD-10-CM

## 2022-03-19 NOTE — Progress Notes (Signed)
Established Patient Office Visit  Subjective:  Patient ID: Kelly Farley, female    DOB: 1955/07/25  Age: 67 y.o. MRN: [id number]  Chief Complaint  Patient presents with   Follow-up    3 month lab    No new complaints, here for lab review and medication refills. Underwent lumbar spinal fusion 3 mo and is recovering with improvement in pain and mobility since then. Labs reviewed and notable for  well controlled diabetes, LDL at target but slight deterioration in Cr although scheduled to f/u with her nephrologist shortly.     Past Medical History:  Diagnosis Date   Arthritis    Diabetes mellitus without complication (HCC)    Hyperlipemia    Hypertension    Palpitations    wore holter monitor 30 years ago; murmur no longer an issue per patient   Sleep apnea     Past Surgical History:  Procedure Laterality Date   ABDOMINAL HYSTERECTOMY     total   COLONOSCOPY WITH PROPOFOL N/A 08/18/2020   Procedure: COLONOSCOPY WITH PROPOFOL;  Surgeon: Virgel Manifold, MD;  Location: [facility] ENDOSCOPY;  Service: Endoscopy;  Laterality: N/A;   EYE SURGERY     JOINT REPLACEMENT  2014   right knee   TRANSFORAMINAL LUMBAR INTERBODY FUSION (TLIF) WITH PEDICLE SCREW FIXATION 2 LEVEL N/A 12/19/2021   Procedure: Lumbar Four- Five Lumbar Five-Sacral One Open laminectomy/Transforaminal Lumbar Interbody Fusion/Posterolateral instrumented fusion;  Surgeon: Judith Part, MD;  Location: [facility];  Service: Neurosurgery;  Laterality: N/A;  RM 21/3C/TO FOLLOW    Social History   Socioeconomic History   Marital status: Single    Spouse name: Not on file   Number of children: Not on file   Years of education: Not on file   Highest education level: Not on file  Occupational History   Not on file  Tobacco Use   Smoking status: Never   Smokeless tobacco: Not on file  Vaping Use   Vaping Use: Never used  Substance and Sexual Activity   Alcohol use: No   Drug use: Never   Sexual activity: Not  on file  Other Topics Concern   Not on file  Social History Narrative   Not on file   Social Determinants of Health   Financial Resource Strain: Not on file  Food Insecurity: Not on file  Transportation Needs: Not on file  Physical Activity: Not on file  Stress: Not on file  Social Connections: Not on file  Intimate Partner Violence: Not on file    Family History  Problem Relation Age of Onset   Colon cancer Mother    Breast cancer Maternal Aunt     Allergies  Allergen Reactions   Soy Allergy Hives and Swelling    Throat swelling    Peanut-Containing Drug Products Swelling and Rash    Review of Systems  Constitutional:  Positive for weight loss.       With diet and exercise  HENT: Negative.    Eyes: Negative.   Respiratory: Negative.    Cardiovascular: Negative.   Gastrointestinal: Negative.   Genitourinary: Negative.   Musculoskeletal:  Positive for back pain.       Mild  Skin: Negative.   Neurological: Negative.        Numbness in right leg which is improving  Endo/Heme/Allergies: Negative.        Objective:   BP 120/80   Pulse 81   Ht 5' 7.5" (1.715 m)   Wt 280 lb (  127 kg)   SpO2 96%   BMI 43.21 kg/m   Vitals:   03/19/22 1018  BP: 120/80  Pulse: 81  Height: 5' 7.5" (1.715 m)  Weight: 280 lb (127 kg)  SpO2: 96%  BMI (Calculated): 43.18    Physical Exam Vitals reviewed.  Constitutional:      General: She is not in acute distress. HENT:     Head: Normocephalic.     Nose: Nose normal.     Mouth/Throat:     Mouth: Mucous membranes are moist.  Eyes:     Extraocular Movements: Extraocular movements intact.     Pupils: Pupils are equal, round, and reactive to light.  Cardiovascular:     Rate and Rhythm: Normal rate and regular rhythm.     Heart sounds: No murmur heard. Pulmonary:     Effort: Pulmonary effort is normal.     Breath sounds: No rhonchi or rales.  Abdominal:     General: Abdomen is flat.     Palpations: There is no  hepatomegaly, splenomegaly or mass.  Musculoskeletal:        General: Normal range of motion.     Cervical back: Normal range of motion. No tenderness.  Skin:    General: Skin is warm and dry.  Neurological:     General: No focal deficit present.     Mental Status: She is alert and oriented to person, place, and time.     Cranial Nerves: No cranial nerve deficit.     Motor: No weakness.  Psychiatric:        Mood and Affect: Mood normal.        Behavior: Behavior normal.      No results found for any visits on 03/19/22.  Recent Results (from the past 2160 hour(Kelly Farley))  Glucose, capillary     Status: Abnormal   Collection Time: 12/19/21  9:56 AM  Result Value Ref Range   Glucose-Capillary 117 (H) 70 - 99 mg/dL    Comment: Glucose reference range applies only to samples taken after fasting for at least 8 hours.  Glucose, capillary     Status: None   Collection Time: 12/19/21 12:12 PM  Result Value Ref Range   Glucose-Capillary 91 70 - 99 mg/dL    Comment: Glucose reference range applies only to samples taken after fasting for at least 8 hours.  Glucose, capillary     Status: None   Collection Time: 12/19/21  1:59 PM  Result Value Ref Range   Glucose-Capillary 97 70 - 99 mg/dL    Comment: Glucose reference range applies only to samples taken after fasting for at least 8 hours.  Glucose, capillary     Status: Abnormal   Collection Time: 12/19/21  3:55 PM  Result Value Ref Range   Glucose-Capillary 108 (H) 70 - 99 mg/dL    Comment: Glucose reference range applies only to samples taken after fasting for at least 8 hours.  Glucose, capillary     Status: Abnormal   Collection Time: 12/19/21  5:02 PM  Result Value Ref Range   Glucose-Capillary 130 (H) 70 - 99 mg/dL    Comment: Glucose reference range applies only to samples taken after fasting for at least 8 hours.  Glucose, capillary     Status: Abnormal   Collection Time: 12/19/21  9:07 PM  Result Value Ref Range    Glucose-Capillary 192 (H) 70 - 99 mg/dL    Comment: Glucose reference range applies only to samples taken after  fasting for at least 8 hours.   Comment 1 Notify RN    Comment 2 Document in Chart   Glucose, capillary     Status: Abnormal   Collection Time: 12/20/21  6:08 AM  Result Value Ref Range   Glucose-Capillary 138 (H) 70 - 99 mg/dL    Comment: Glucose reference range applies only to samples taken after fasting for at least 8 hours.   Comment 1 Notify RN    Comment 2 Document in Chart   Glucose, capillary     Status: Abnormal   Collection Time: 12/20/21 11:09 AM  Result Value Ref Range   Glucose-Capillary 142 (H) 70 - 99 mg/dL    Comment: Glucose reference range applies only to samples taken after fasting for at least 8 hours.  Glucose, capillary     Status: None   Collection Time: 12/20/21  4:41 PM  Result Value Ref Range   Glucose-Capillary 80 70 - 99 mg/dL    Comment: Glucose reference range applies only to samples taken after fasting for at least 8 hours.  Glucose, capillary     Status: Abnormal   Collection Time: 12/20/21  9:09 PM  Result Value Ref Range   Glucose-Capillary 156 (H) 70 - 99 mg/dL    Comment: Glucose reference range applies only to samples taken after fasting for at least 8 hours.   Comment 1 Notify RN    Comment 2 Document in Chart   Glucose, capillary     Status: Abnormal   Collection Time: 12/21/21  6:06 AM  Result Value Ref Range   Glucose-Capillary 122 (H) 70 - 99 mg/dL    Comment: Glucose reference range applies only to samples taken after fasting for at least 8 hours.   Comment 1 Notify RN    Comment 2 Document in Chart   Comprehensive metabolic panel     Status: Abnormal   Collection Time: 03/06/22 11:40 AM  Result Value Ref Range   Glucose 130 (H) 70 - 99 mg/dL   BUN 23 8 - 27 mg/dL   Creatinine, Ser 1.30 (H) 0.57 - 1.00 mg/dL   eGFR 45 (L) >59 mL/min/1.73   BUN/Creatinine Ratio 18 12 - 28   Sodium 141 134 - 144 mmol/L   Potassium 4.1 3.5  - 5.2 mmol/L   Chloride 101 96 - 106 mmol/L   CO2 23 20 - 29 mmol/L   Calcium 9.9 8.7 - 10.3 mg/dL   Total Protein 7.0 6.0 - 8.5 g/dL   Albumin 4.2 3.9 - 4.9 g/dL   Globulin, Total 2.8 1.5 - 4.5 g/dL   Albumin/Globulin Ratio 1.5 1.2 - 2.2   Bilirubin Total 0.3 0.0 - 1.2 mg/dL   Alkaline Phosphatase 97 44 - 121 IU/L   AST 24 0 - 40 IU/L   ALT 16 0 - 32 IU/L  Lipid Panel w/o Chol/HDL Ratio     Status: None   Collection Time: 03/06/22 11:40 AM  Result Value Ref Range   Cholesterol, Total 169 100 - 199 mg/dL   Triglycerides 61 0 - 149 mg/dL   HDL 63 >39 mg/dL   VLDL Cholesterol Cal 12 5 - 40 mg/dL   LDL Chol Calc (NIH) 94 0 - 99 mg/dL  Hgb A1c w/o eAG     Status: Abnormal   Collection Time: 03/06/22 11:40 AM  Result Value Ref Range   Hgb A1c MFr Bld 5.7 (H) 4.8 - 5.6 %    Comment:  Prediabetes: 5.7 - 6.4          Diabetes: >6.4          Glycemic control for adults with diabetes: <7.0       Assessment & Plan:   Problem List Items Addressed This Visit   None 1. Controlled type 2 diabetes mellitus without complication, without long-term current use of insulin (HCC) - Hemoglobin A1c; Future  2. Mixed hyperlipidemia - Lipid panel; Future - Comprehensive metabolic panel; Future  3. Primary hypertension    No follow-ups on file.   Total time spent: 20 minutes  Volanda Napoleon, MD  03/19/2022

## 2022-03-27 ENCOUNTER — Ambulatory Visit
Admission: RE | Admit: 2022-03-27 | Discharge: 2022-03-27 | Disposition: A | Discharge status: Home / Self Care | Payer: PPO | Source: Ambulatory Visit | Attending: Internal Medicine | Admitting: Internal Medicine

## 2022-03-27 DIAGNOSIS — R921 Mammographic calcification found on diagnostic imaging of breast: Secondary | ICD-10-CM | POA: Insufficient documentation

## 2022-03-27 DIAGNOSIS — R928 Other abnormal and inconclusive findings on diagnostic imaging of breast: Secondary | ICD-10-CM | POA: Diagnosis not present

## 2022-03-28 ENCOUNTER — Other Ambulatory Visit: Payer: Self-pay | Admitting: Internal Medicine

## 2022-03-28 DIAGNOSIS — R921 Mammographic calcification found on diagnostic imaging of breast: Secondary | ICD-10-CM

## 2022-03-28 DIAGNOSIS — R928 Other abnormal and inconclusive findings on diagnostic imaging of breast: Secondary | ICD-10-CM

## 2022-04-03 ENCOUNTER — Ambulatory Visit
Admission: RE | Admit: 2022-04-03 | Discharge: 2022-04-03 | Disposition: A | Discharge status: Home / Self Care | Payer: PPO | Source: Ambulatory Visit | Attending: Internal Medicine | Admitting: Internal Medicine

## 2022-04-03 DIAGNOSIS — R928 Other abnormal and inconclusive findings on diagnostic imaging of breast: Secondary | ICD-10-CM | POA: Diagnosis not present

## 2022-04-03 DIAGNOSIS — R921 Mammographic calcification found on diagnostic imaging of breast: Secondary | ICD-10-CM | POA: Insufficient documentation

## 2022-04-03 DIAGNOSIS — N6082 Other benign mammary dysplasias of left breast: Secondary | ICD-10-CM | POA: Diagnosis not present

## 2022-04-03 HISTORY — PX: BREAST BIOPSY: SHX20

## 2022-04-03 MED ORDER — LIDOCAINE HCL (PF) 1 % IJ SOLN
5.0000 mL | Freq: Once | INTRAMUSCULAR | Status: AC
  Administered 2022-04-03: 5 mL
  Filled 2022-04-03: qty 5

## 2022-04-03 MED ORDER — LIDOCAINE-EPINEPHRINE 1 %-1:100000 IJ SOLN
20.0000 mL | Freq: Once | INTRAMUSCULAR | Status: AC
  Administered 2022-04-03: 20 mL
  Filled 2022-04-03: qty 20

## 2022-04-04 LAB — SURGICAL PATHOLOGY

## 2022-04-10 ENCOUNTER — Encounter: Payer: Self-pay | Admitting: Family

## 2022-04-10 ENCOUNTER — Ambulatory Visit (INDEPENDENT_AMBULATORY_CARE_PROVIDER_SITE_OTHER): Payer: PPO | Admitting: Family

## 2022-04-10 VITALS — BP 122/70 | HR 86 | Ht 67.5 in | Wt 272.0 lb

## 2022-04-10 DIAGNOSIS — J069 Acute upper respiratory infection, unspecified: Secondary | ICD-10-CM

## 2022-04-10 DIAGNOSIS — R0989 Other specified symptoms and signs involving the circulatory and respiratory systems: Secondary | ICD-10-CM

## 2022-04-10 DIAGNOSIS — Z20822 Contact with and (suspected) exposure to covid-19: Secondary | ICD-10-CM | POA: Diagnosis not present

## 2022-04-10 DIAGNOSIS — M4326 Fusion of spine, lumbar region: Secondary | ICD-10-CM | POA: Insufficient documentation

## 2022-04-10 DIAGNOSIS — I10 Essential (primary) hypertension: Secondary | ICD-10-CM | POA: Insufficient documentation

## 2022-04-10 DIAGNOSIS — K219 Gastro-esophageal reflux disease without esophagitis: Secondary | ICD-10-CM | POA: Insufficient documentation

## 2022-04-10 LAB — POCT XPERT XPRESS SARS COVID-2/FLU/RSV
FLU A: NEGATIVE
FLU B: NEGATIVE
RSV RNA, PCR: NEGATIVE
SARS Coronavirus 2: NEGATIVE

## 2022-04-10 NOTE — Progress Notes (Signed)
Established Patient Office Visit  Subjective:  Patient ID: Kelly Farley, female    DOB: [birth year]  Age: 67 y.o. MRN: [id number]  Chief Complaint  Patient presents with   URI    URI  This is a new problem. The current episode started in the past 7 days. The problem has been gradually worsening. There has been no fever. Associated symptoms include congestion, coughing, nausea (with cough), rhinorrhea and sinus pain. She has tried decongestant, acetaminophen, NSAIDs, sleep, eating, steam and increased fluids for the symptoms. The treatment provided no relief.    No other concerns at this time.   Past Medical History:  Diagnosis Date   Arthritis    Diabetes mellitus without complication    Hyperlipemia    Hypertension    Other abnormal findings in urine 07/05/2021   Pain in right knee 02/14/2012   Palpitations    wore holter monitor 30 years ago; murmur no longer an issue per patient   Sleep apnea    Sweats, menopausal 10/20/2015    Past Surgical History:  Procedure Laterality Date   ABDOMINAL HYSTERECTOMY     total   BREAST BIOPSY Left 04/03/2022   stereo bx, calcs, RIBBON clip-path pending   BREAST BIOPSY Left 04/03/2022   MM LT BREAST BX W LOC DEV 1ST LESION IMAGE BX SPEC STEREO GUIDE 04/03/2022 [facility]   COLONOSCOPY WITH PROPOFOL N/A 08/18/2020   Procedure: COLONOSCOPY WITH PROPOFOL;  Surgeon: Virgel Manifold, MD;  Location: [facility];  Service: Endoscopy;  Laterality: N/A;   EYE SURGERY     JOINT REPLACEMENT  01/09/2012   right knee   TRANSFORAMINAL LUMBAR INTERBODY FUSION (TLIF) WITH PEDICLE SCREW FIXATION 2 LEVEL N/A 12/19/2021   Procedure: Lumbar Four- Five Lumbar Five-Sacral One Open laminectomy/Transforaminal Lumbar Interbody Fusion/Posterolateral instrumented fusion;  Surgeon: Judith Part, MD;  Location: [place];  Service: Neurosurgery;  Laterality: N/A;  RM 21/3C/TO FOLLOW    Social History   Socioeconomic History   Marital status:  Single    Spouse name: Not on file   Number of children: Not on file   Years of education: Not on file   Highest education level: Not on file  Occupational History   Not on file  Tobacco Use   Smoking status: Never   Smokeless tobacco: Not on file  Vaping Use   Vaping Use: Never used  Substance and Sexual Activity   Alcohol use: No   Drug use: Never   Sexual activity: Not on file  Other Topics Concern   Not on file  Social History Narrative   Not on file   Social Determinants of Health   Financial Resource Strain: Not on file  Food Insecurity: Not on file  Transportation Needs: Not on file  Physical Activity: Not on file  Stress: Not on file  Social Connections: Not on file  Intimate Partner Violence: Not on file    Family History  Problem Relation Age of Onset   Colon cancer Mother    Breast cancer Maternal Aunt     Allergies  Allergen Reactions   Soy Allergy Hives and Swelling    Throat swelling    Peanut-Containing Drug Products Swelling and Rash    Review of Systems  Constitutional:  Positive for malaise/fatigue.  HENT:  Positive for congestion, rhinorrhea and sinus pain.   Respiratory:  Positive for cough.   Gastrointestinal:  Positive for nausea (with cough).  All other systems reviewed and are negative.  Objective:   BP 122/70   Pulse 86   Ht 5' 7.5" (1.715 m)   Wt 272 lb (123.4 kg)   SpO2 96%   BMI 41.97 kg/m   Vitals:   04/10/22 0921  BP: 122/70  Pulse: 86  Height: 5' 7.5" (1.715 m)  Weight: 272 lb (123.4 kg)  SpO2: 96%  BMI (Calculated): 41.95    Physical Exam Vitals and nursing note reviewed.  Constitutional:      General: She is not in acute distress.    Appearance: Normal appearance. She is normal weight. She is not ill-appearing.  HENT:     Head: Normocephalic.     Nose: Congestion and rhinorrhea present.  Eyes:     Extraocular Movements: Extraocular movements intact.     Conjunctiva/sclera: Conjunctivae normal.      Pupils: Pupils are equal, round, and reactive to light.  Cardiovascular:     Rate and Rhythm: Normal rate.  Pulmonary:     Effort: Pulmonary effort is normal.     Breath sounds: Normal breath sounds.  Musculoskeletal:     Cervical back: Normal range of motion.  Neurological:     Mental Status: She is alert.      Results for orders placed or performed in visit on 04/10/22  POCT XPERT XPRESS SARS COVID-2/FLU/RSV [id number]  Result Value Ref Range   SARS Coronavirus 2 Negative    FLU A Negative    FLU B Negative    RSV RNA, PCR Negative     Recent Results (from the past 2160 hour(s))  Comprehensive metabolic panel     Status: Abnormal   Collection Time: 03/06/22 11:40 AM  Result Value Ref Range   Glucose 130 (H) 70 - 99 mg/dL   BUN 23 8 - 27 mg/dL   Creatinine, Ser 1.30 (H) 0.57 - 1.00 mg/dL   eGFR 45 (L) >59 mL/min/1.73   BUN/Creatinine Ratio 18 12 - 28   Sodium 141 134 - 144 mmol/L   Potassium 4.1 3.5 - 5.2 mmol/L   Chloride 101 96 - 106 mmol/L   CO2 23 20 - 29 mmol/L   Calcium 9.9 8.7 - 10.3 mg/dL   Total Protein 7.0 6.0 - 8.5 g/dL   Albumin 4.2 3.9 - 4.9 g/dL   Globulin, Total 2.8 1.5 - 4.5 g/dL   Albumin/Globulin Ratio 1.5 1.2 - 2.2   Bilirubin Total 0.3 0.0 - 1.2 mg/dL   Alkaline Phosphatase 97 44 - 121 IU/L   AST 24 0 - 40 IU/L   ALT 16 0 - 32 IU/L  Lipid Panel w/o Chol/HDL Ratio     Status: None   Collection Time: 03/06/22 11:40 AM  Result Value Ref Range   Cholesterol, Total 169 100 - 199 mg/dL   Triglycerides 61 0 - 149 mg/dL   HDL 63 >39 mg/dL   VLDL Cholesterol Cal 12 5 - 40 mg/dL   LDL Chol Calc (NIH) 94 0 - 99 mg/dL  Hgb A1c w/o eAG     Status: Abnormal   Collection Time: 03/06/22 11:40 AM  Result Value Ref Range   Hgb A1c MFr Bld 5.7 (H) 4.8 - 5.6 %    Comment:          Prediabetes: 5.7 - 6.4          Diabetes: >6.4          Glycemic control for adults with diabetes: <7.0   Surgical pathology     Status: None   Collection  Time: 04/03/22  10:43 AM  Result Value Ref Range   SURGICAL PATHOLOGY      SURGICAL PATHOLOGY CASE: [id number] PATIENT: Firth Surgical Pathology Report     Specimen Submitted: A. Breast, left central  Clinical History: CALCS. Ribbon-shaped clip placed following stereotactic biopsy of LEFT breast, lateral central.    DIAGNOSIS: A. BREAST, LEFT, LATERAL CENTRAL; STEREOTACTIC CORE NEEDLE BIOPSY: - BENIGN MAMMARY PARENCHYMA WITH FIBROADENOMATOID CHANGES AND ASSOCIATED COARSE DYSTROPHIC CALCIFICATIONS. - BACKGROUND BENIGN FIBROCYSTIC AND APOCRINE CHANGES. - NEGATIVE FOR ATYPICAL PROLIFERATIVE BREAST DISEASE.  GROSS DESCRIPTION: A. Labeled: Left breast stereo biopsy calcs lateral central Received: in a formalin-filled Brevera collection device Specimen radiograph image(s) available for review Time/Date in fixative: Collected at 10:42 AM on 04/03/2022 and placed in formalin at 10:45 AM on 04/03/2022 Cold ischemic time: Less than 5 minutes Total fixation time: Approximately 9.5 hours Core pieces: Multiple Measurement: Aggregat e, 5.7 x 1 x 0.2 cm Description / comments: Received are cores and fragments of yellow fibrofatty tissue.  The accompanying diagram has section B checked. Inked: Blue Entirely submitted in cassette(s):  1 - section B 2 - 3 - remaining tissue fragment      2 - sections A and C      3 - section D and remaining free-floating fragments  RB 04/03/2022   Final Diagnosis performed by Allena Napoleon, MD.   Electronically signed 04/04/2022 8:49:26AM The electronic signature indicates that the named Attending Pathologist has evaluated the specimen Technical component performed at [facility], [address], [place], [place] [address] Lab: [phone] Dir: Rush Farmer, MD, MMM  Professional component performed at [facility], [facility], [place], [place], [place] [address] Lab: [phone] Dir: Kathi Simpers, MD   POCT XPERT XPRESS SARS  COVID-2/FLU/RSV [id number]     Status: Normal   Collection Time: 04/10/22 11:39 AM  Result Value Ref Range   SARS Coronavirus 2 Negative    FLU A Negative    FLU B Negative    RSV RNA, PCR Negative       Assessment & Plan:   Problem List Items Addressed This Visit   None Visit Diagnoses     Chest congestion    -  Primary   Relevant Orders   POCT XPERT XPRESS SARS COVID-2/FLU/RSV [id number] (Completed)   Upper respiratory tract infection, unspecified type           Return in about 1 week (around 04/17/2022) for F/U.   Total time spent: 20 minutes  Mechele Claude, FNP  04/10/2022

## 2022-04-11 MED ORDER — BENZONATATE 100 MG PO CAPS: 100.0000 mg | ORAL_CAPSULE | Freq: Three times a day (TID) | ORAL | 0 refills | Status: DC | PRN

## 2022-04-11 MED ORDER — AMOXICILLIN-POT CLAVULANATE 875-125 MG PO TABS: 1.0000 | ORAL_TABLET | Freq: Two times a day (BID) | ORAL | 0 refills | Status: DC

## 2022-04-11 NOTE — Addendum Note (Signed)
Addended by: Wyvonna Plum on: 04/11/2022 11:55 AM   Modules accepted: Orders

## 2022-04-14 ENCOUNTER — Other Ambulatory Visit: Payer: Self-pay | Admitting: Internal Medicine

## 2022-04-14 DIAGNOSIS — M109 Gout, unspecified: Secondary | ICD-10-CM

## 2022-04-17 ENCOUNTER — Encounter: Payer: Self-pay | Admitting: Family

## 2022-04-17 ENCOUNTER — Ambulatory Visit (INDEPENDENT_AMBULATORY_CARE_PROVIDER_SITE_OTHER): Payer: PPO | Admitting: Family

## 2022-04-17 VITALS — BP 126/68 | HR 69 | Ht 67.5 in | Wt 280.8 lb

## 2022-04-17 DIAGNOSIS — J301 Allergic rhinitis due to pollen: Secondary | ICD-10-CM | POA: Diagnosis not present

## 2022-04-17 DIAGNOSIS — J069 Acute upper respiratory infection, unspecified: Secondary | ICD-10-CM

## 2022-04-17 DIAGNOSIS — R0989 Other specified symptoms and signs involving the circulatory and respiratory systems: Secondary | ICD-10-CM | POA: Diagnosis not present

## 2022-04-17 MED ORDER — ALBUTEROL SULFATE HFA 108 (90 BASE) MCG/ACT IN AERS: 2.0000 | INHALATION_SPRAY | Freq: Four times a day (QID) | RESPIRATORY_TRACT | 2 refills | Status: AC | PRN

## 2022-04-17 NOTE — Progress Notes (Signed)
Established Patient Office Visit  Subjective:  Patient ID: Kelly Farley, female    DOB: 01/29/1955  Age: 67 y.o. MRN: [id number]  Chief Complaint  Patient presents with   Follow-up    1 week follow up    Patient is here for her 1 week follow up.  She is feeling much better, but is still having some coughing.  She does say that most of her cough has resolved, and that most of the time she is having dry cough instead of productive.   Has in the past needed Albuterol inhaler to help resolve coughing/bronchitis issues, especially around Springtime.   No other concerns at this time.   Past Medical History:  Diagnosis Date   Arthritis    Diabetes mellitus without complication    Hyperlipemia    Hypertension    Other abnormal findings in urine 07/05/2021   Pain in right knee 02/14/2012   Palpitations    wore holter monitor 30 years ago; murmur no longer an issue per patient   Sleep apnea    Sweats, menopausal 10/20/2015    Past Surgical History:  Procedure Laterality Date   ABDOMINAL HYSTERECTOMY     total   BREAST BIOPSY Left 04/03/2022   stereo bx, calcs, RIBBON clip-path pending   BREAST BIOPSY Left 04/03/2022   MM LT BREAST BX W LOC DEV 1ST LESION IMAGE BX SPEC STEREO GUIDE 04/03/2022 [facility]   COLONOSCOPY WITH PROPOFOL N/A 08/18/2020   Procedure: COLONOSCOPY WITH PROPOFOL;  Surgeon: Pasty Spillers, MD;  Location: [facility];  Service: Endoscopy;  Laterality: N/A;   EYE SURGERY     JOINT REPLACEMENT  01/09/2012   right knee   TRANSFORAMINAL LUMBAR INTERBODY FUSION (TLIF) WITH PEDICLE SCREW FIXATION 2 LEVEL N/A 12/19/2021   Procedure: Lumbar Four- Five Lumbar Five-Sacral One Open laminectomy/Transforaminal Lumbar Interbody Fusion/Posterolateral instrumented fusion;  Surgeon: Jadene Pierini, MD;  [facility];  Service: Neurosurgery;  Laterality: N/A;  RM 21/3C/TO FOLLOW    Social History   Socioeconomic History   Marital status: Single     Spouse name: Not on file   Number of children: Not on file   Years of education: Not on file   Highest education level: Not on file  Occupational History   Not on file  Tobacco Use   Smoking status: Never   Smokeless tobacco: Not on file  Vaping Use   Vaping Use: Never used  Substance and Sexual Activity   Alcohol use: No   Drug use: Never   Sexual activity: Not on file  Other Topics Concern   Not on file  Social History Narrative   Not on file   Social Determinants of Health   Financial Resource Strain: Not on file  Food Insecurity: Not on file  Transportation Needs: Not on file  Physical Activity: Not on file  Stress: Not on file  Social Connections: Not on file  Intimate Partner Violence: Not on file    Family History  Problem Relation Age of Onset   Colon cancer Mother    Breast cancer Maternal Aunt     Allergies  Allergen Reactions   Soy Allergy Hives and Swelling    Throat swelling    Peanut-Containing Drug Products Swelling and Rash    Review of Systems  Respiratory:  Positive for cough. Negative for sputum production.   All other systems reviewed and are negative.      Objective:   BP 126/68   Pulse 69  Ht 5' 7.5" (1.715 m)   Wt 280 lb 12.8 oz (127.4 kg)   SpO2 100%   BMI 43.33 kg/m   Vitals:   04/17/22 1025  BP: 126/68  Pulse: 69  Height: 5' 7.5" (1.715 m)  Weight: 280 lb 12.8 oz (127.4 kg)  SpO2: 100%  BMI (Calculated): 43.31    Physical Exam Vitals and nursing note reviewed.  Constitutional:      General: She is not in acute distress.    Appearance: Normal appearance. She is obese.  HENT:     Head: Normocephalic.  Eyes:     Pupils: Pupils are equal, round, and reactive to light.  Cardiovascular:     Rate and Rhythm: Normal rate.  Pulmonary:     Effort: Pulmonary effort is normal.     Breath sounds: Normal breath sounds.  Neurological:     Mental Status: She is alert.     No results found for any visits on  04/17/22.   Assessment & Plan:   Problem List Items Addressed This Visit   None Visit Diagnoses     Chest congestion    -  Primary   Upper respiratory tract infection, unspecified type       Seasonal allergic rhinitis due to pollen           Return as scheduled with Dr. Ellsworth Lennox, for F/U.   Total time spent: 20 minutes  Miki Kins, FNP  04/17/2022

## 2022-05-07 ENCOUNTER — Telehealth: Payer: Self-pay

## 2022-05-07 NOTE — Telephone Encounter (Signed)
Patient called asking if youd write her a letter stating that you took her off the kidney medication that you had put her on from May 2023 please advise

## 2022-05-17 DIAGNOSIS — M25561 Pain in right knee: Secondary | ICD-10-CM | POA: Diagnosis not present

## 2022-05-17 DIAGNOSIS — M25562 Pain in left knee: Secondary | ICD-10-CM | POA: Diagnosis not present

## 2022-05-17 DIAGNOSIS — M170 Bilateral primary osteoarthritis of knee: Secondary | ICD-10-CM | POA: Diagnosis not present

## 2022-05-18 ENCOUNTER — Telehealth: Payer: Self-pay | Admitting: Internal Medicine

## 2022-05-18 NOTE — Telephone Encounter (Signed)
Patient called in to see if the letter she requested from Dr. Ellsworth Lennox has been completed. I advised her that it is awaiting his signature and we will call her when it is ready.

## 2022-05-21 ENCOUNTER — Telehealth: Payer: Self-pay

## 2022-05-21 ENCOUNTER — Other Ambulatory Visit: Payer: Self-pay

## 2022-05-21 DIAGNOSIS — E119 Type 2 diabetes mellitus without complications: Secondary | ICD-10-CM

## 2022-05-21 MED ORDER — RYBELSUS 7 MG PO TABS: ORAL_TABLET | ORAL | 0 refills | Status: DC

## 2022-05-21 NOTE — Telephone Encounter (Signed)
Patient letter Is ready for pick up and I have sent the rquest to [facility] for the [employer]

## 2022-06-07 ENCOUNTER — Other Ambulatory Visit: Payer: Self-pay | Admitting: Internal Medicine

## 2022-06-07 DIAGNOSIS — I10 Essential (primary) hypertension: Secondary | ICD-10-CM

## 2022-06-13 ENCOUNTER — Other Ambulatory Visit: Payer: PPO

## 2022-06-13 DIAGNOSIS — E782 Mixed hyperlipidemia: Secondary | ICD-10-CM

## 2022-06-13 DIAGNOSIS — E119 Type 2 diabetes mellitus without complications: Secondary | ICD-10-CM | POA: Diagnosis not present

## 2022-06-14 LAB — LIPID PANEL
Chol/HDL Ratio: 2.4 ratio (ref 0.0–4.4)
Cholesterol, Total: 149 mg/dL (ref 100–199)
HDL: 61 mg/dL (ref >39)
LDL Chol Calc (NIH): 73 mg/dL (ref 0–99)
Triglycerides: 75 mg/dL (ref 0–149)
VLDL Cholesterol Cal: 15 mg/dL (ref 5–40)

## 2022-06-14 LAB — COMPREHENSIVE METABOLIC PANEL
ALT: 14 IU/L (ref 0–32)
AST: 27 IU/L (ref 0–40)
Albumin/Globulin Ratio: 1.6 (ref 1.2–2.2)
Albumin: 4 g/dL (ref 3.9–4.9)
Alkaline Phosphatase: 80 IU/L (ref 44–121)
BUN/Creatinine Ratio: 16 (ref 12–28)
BUN: 18 mg/dL (ref 8–27)
Bilirubin Total: 0.3 mg/dL (ref 0.0–1.2)
CO2: 27 mmol/L (ref 20–29)
Calcium: 9.9 mg/dL (ref 8.7–10.3)
Chloride: 100 mmol/L (ref 96–106)
Creatinine, Ser: 1.11 mg/dL — ABNORMAL HIGH (ref 0.57–1.00)
Globulin, Total: 2.5 g/dL (ref 1.5–4.5)
Glucose: 112 mg/dL — ABNORMAL HIGH (ref 70–99)
Potassium: 4.2 mmol/L (ref 3.5–5.2)
Sodium: 141 mmol/L (ref 134–144)
Total Protein: 6.5 g/dL (ref 6.0–8.5)
eGFR: 54 mL/min/{1.73_m2} — ABNORMAL LOW (ref >59)

## 2022-06-14 LAB — HEMOGLOBIN A1C
Est. average glucose Bld gHb Est-mCnc: 114 mg/dL
Hgb A1c MFr Bld: 5.6 % (ref 4.8–5.6)

## 2022-06-19 ENCOUNTER — Ambulatory Visit (INDEPENDENT_AMBULATORY_CARE_PROVIDER_SITE_OTHER): Payer: PPO | Admitting: Internal Medicine

## 2022-06-19 ENCOUNTER — Encounter: Payer: Self-pay | Admitting: Internal Medicine

## 2022-06-19 VITALS — BP 112/72 | HR 71 | Ht 67.5 in | Wt 275.8 lb

## 2022-06-19 DIAGNOSIS — I10 Essential (primary) hypertension: Secondary | ICD-10-CM

## 2022-06-19 DIAGNOSIS — E782 Mixed hyperlipidemia: Secondary | ICD-10-CM

## 2022-06-19 DIAGNOSIS — E119 Type 2 diabetes mellitus without complications: Secondary | ICD-10-CM

## 2022-06-19 LAB — POCT CBG (FASTING - GLUCOSE)-MANUAL ENTRY: Glucose Fasting, POC: 137 mg/dL — AB (ref 70–99)

## 2022-06-19 LAB — POC CREATINE & ALBUMIN,URINE
Creatinine, POC: 100 mg/dL
Microalbumin Ur, POC: 30 mg/L

## 2022-06-19 NOTE — Progress Notes (Signed)
Established Patient Office Visit  Subjective:  Patient ID: Kelly Farley, female    DOB: 12-02-1955  Age: 67 y.o. MRN: [id number]  Chief Complaint  Patient presents with   Follow-up    3 month follow up, discuss lab results.    No new complaints, here for lab review and medication refills. Labs reviewed and notable for well controlled diabetes, A1c at target, lipids at target with unremarkable cmp. Denies any hypoglycemic episodes and home bg readings have been at target. Continues to lose weight with diet and GLP-1.  No other concerns at this time.   Past Medical History:  Diagnosis Date   Arthritis    Diabetes mellitus without complication (HCC)    Hyperlipemia    Hypertension    Other abnormal findings in urine 07/05/2021   Pain in right knee 02/14/2012   Palpitations    wore holter monitor 30 years ago; murmur no longer an issue per patient   Sleep apnea    Sweats, menopausal 10/20/2015    Past Surgical History:  Procedure Laterality Date   ABDOMINAL HYSTERECTOMY     total   BREAST BIOPSY Left 04/03/2022   stereo bx, calcs, RIBBON clip-path pending   BREAST BIOPSY Left 04/03/2022   MM LT BREAST BX W LOC DEV 1ST LESION IMAGE BX SPEC STEREO GUIDE 04/03/2022 [facility]   COLONOSCOPY WITH PROPOFOL N/A 08/18/2020   Procedure: COLONOSCOPY WITH PROPOFOL;  Surgeon: Pasty Spillers, MD;  Location: [facility];  Service: Endoscopy;  Laterality: N/A;   EYE SURGERY     JOINT REPLACEMENT  01/09/2012   right knee   TRANSFORAMINAL LUMBAR INTERBODY FUSION (TLIF) WITH PEDICLE SCREW FIXATION 2 LEVEL N/A 12/19/2021   Procedure: Lumbar Four- Five Lumbar Five-Sacral One Open laminectomy/Transforaminal Lumbar Interbody Fusion/Posterolateral instrumented fusion;  Surgeon: Jadene Pierini, MD;  [facility];  Service: Neurosurgery;  Laterality: N/A;  RM 21/3C/TO FOLLOW    Social History   Socioeconomic History   Marital status: Single    Spouse name: Not on file    Number of children: Not on file   Years of education: Not on file   Highest education level: Not on file  Occupational History   Not on file  Tobacco Use   Smoking status: Never   Smokeless tobacco: Not on file  Vaping Use   Vaping Use: Never used  Substance and Sexual Activity   Alcohol use: No   Drug use: Never   Sexual activity: Not on file  Other Topics Concern   Not on file  Social History Narrative   Not on file   Social Determinants of Health   Financial Resource Strain: Not on file  Food Insecurity: Not on file  Transportation Needs: Not on file  Physical Activity: Not on file  Stress: Not on file  Social Connections: Not on file  Intimate Partner Violence: Not on file    Family History  Problem Relation Age of Onset   Colon cancer Mother    Breast cancer Maternal Aunt     Allergies  Allergen Reactions   Soy Allergy Hives and Swelling    Throat swelling    Peanut-Containing Drug Products Swelling and Rash    Review of Systems  Constitutional:  Positive for weight loss.       With diet and exercise  HENT: Negative.    Eyes: Negative.   Respiratory: Negative.    Cardiovascular: Negative.   Gastrointestinal: Negative.   Genitourinary: Negative.   Musculoskeletal:  Mild  Skin: Negative.   Neurological: Negative.        Numbness in right leg which is improving  Endo/Heme/Allergies: Negative.        Objective:   BP 112/72   Pulse 71   Ht 5' 7.5" (1.715 m)   Wt 275 lb 12.8 oz (125.1 kg)   SpO2 95%   BMI 42.56 kg/m   Vitals:   06/19/22 1022  BP: 112/72  Pulse: 71  Height: 5' 7.5" (1.715 m)  Weight: 275 lb 12.8 oz (125.1 kg)  SpO2: 95%  BMI (Calculated): 42.53    Physical Exam Vitals reviewed.  Constitutional:      General: She is not in acute distress.    Appearance: She is obese.  HENT:     Head: Normocephalic.     Nose: Nose normal.     Mouth/Throat:     Mouth: Mucous membranes are moist.  Eyes:     Extraocular  Movements: Extraocular movements intact.     Pupils: Pupils are equal, round, and reactive to light.  Cardiovascular:     Rate and Rhythm: Normal rate and regular rhythm.     Heart sounds: No murmur heard. Pulmonary:     Effort: Pulmonary effort is normal.     Breath sounds: No rhonchi or rales.  Abdominal:     General: Abdomen is flat.     Palpations: There is no hepatomegaly, splenomegaly or mass.  Musculoskeletal:        General: Normal range of motion.     Cervical back: Normal range of motion. No tenderness.  Skin:    General: Skin is warm and dry.  Neurological:     General: No focal deficit present.     Mental Status: She is alert and oriented to person, place, and time.     Cranial Nerves: No cranial nerve deficit.     Motor: No weakness.  Psychiatric:        Mood and Affect: Mood normal.        Behavior: Behavior normal.     Results for orders placed or performed in visit on 06/19/22  POCT CBG (Fasting - Glucose)  Result Value Ref Range   Glucose Fasting, POC 137 (A) 70 - 99 mg/dL    Recent Results (from the past 2160 hour(Eh Sauseda))  Surgical pathology     Status: None   Collection Time: 04/03/22 10:43 AM  Result Value Ref Range   SURGICAL PATHOLOGY      SURGICAL PATHOLOGY CASE: [id number] PATIENT: Kelly Farley Surgical Pathology Report     Specimen Submitted: A. Breast, left central  Clinical History: CALCS. Ribbon-shaped clip placed following stereotactic biopsy of LEFT breast, lateral central.    DIAGNOSIS: A. BREAST, LEFT, LATERAL CENTRAL; STEREOTACTIC CORE NEEDLE BIOPSY: - BENIGN MAMMARY PARENCHYMA WITH FIBROADENOMATOID CHANGES AND ASSOCIATED COARSE DYSTROPHIC CALCIFICATIONS. - BACKGROUND BENIGN FIBROCYSTIC AND APOCRINE CHANGES. - NEGATIVE FOR ATYPICAL PROLIFERATIVE BREAST DISEASE.  GROSS DESCRIPTION: A. Labeled: Left breast stereo biopsy calcs lateral central Received: in a formalin-filled Brevera collection device Specimen radiograph  image(Kelly Farley) available for review Time/Date in fixative: Collected at 10:42 AM on 04/03/2022 and placed in formalin at 10:45 AM on 04/03/2022 Cold ischemic time: Less than 5 minutes Total fixation time: Approximately 9.5 hours Core pieces: Multiple Measurement: Aggregat e, 5.7 x 1 x 0.2 cm Description / comments: Received are cores and fragments of yellow fibrofatty tissue.  The accompanying diagram has section B checked. Inked: Blue Entirely submitted in cassette(Kimetha Trulson):  1 -  section B 2 - 3 - remaining tissue fragment      2 - sections A and C      3 - section D and remaining free-floating fragments  RB 04/03/2022   Final Diagnosis performed by Katherine Mantle, MD.   Electronically signed 04/04/2022 8:49:26AM The electronic signature indicates that the named Attending Pathologist has evaluated the specimen Technical component performed at [facility], [address] [place], [place] [address] Lab: [phone] Dir: Jolene Schimke, MD, MMM  Professional component performed at [facility], [facility], [address] [place], [place], [place] [address] Lab: [phone] Dir: Beryle Quant, MD   POCT XPERT XPRESS SARS COVID-2/FLU/RSV [[id number]]     Status: Normal   Collection Time: 04/10/22 11:39 AM  Result Value Ref Range   SARS Coronavirus 2 Negative    FLU A Negative    FLU B Negative    RSV RNA, PCR Negative   Comprehensive metabolic panel     Status: Abnormal   Collection Time: 06/13/22 11:46 AM  Result Value Ref Range   Glucose 112 (H) 70 - 99 mg/dL   BUN 18 8 - 27 mg/dL   Creatinine, Ser 7.42 (H) 0.57 - 1.00 mg/dL   eGFR 54 (L) >59 DG/LOV/5.64   BUN/Creatinine Ratio 16 12 - 28   Sodium 141 134 - 144 mmol/L   Potassium 4.2 3.5 - 5.2 mmol/L   Chloride 100 96 - 106 mmol/L   CO2 27 20 - 29 mmol/L   Calcium 9.9 8.7 - 10.3 mg/dL   Total Protein 6.5 6.0 - 8.5 g/dL   Albumin 4.0 3.9 - 4.9 g/dL   Globulin, Total 2.5 1.5 - 4.5 g/dL   Albumin/Globulin Ratio 1.6 1.2 - 2.2    Bilirubin Total 0.3 0.0 - 1.2 mg/dL   Alkaline Phosphatase 80 44 - 121 IU/L   AST 27 0 - 40 IU/L   ALT 14 0 - 32 IU/L  Hemoglobin A1c     Status: None   Collection Time: 06/13/22 11:46 AM  Result Value Ref Range   Hgb A1c MFr Bld 5.6 4.8 - 5.6 %    Comment:          Prediabetes: 5.7 - 6.4          Diabetes: >6.4          Glycemic control for adults with diabetes: <7.0    Est. average glucose Bld gHb Est-mCnc 114 mg/dL  Lipid panel     Status: None   Collection Time: 06/13/22 11:46 AM  Result Value Ref Range   Cholesterol, Total 149 100 - 199 mg/dL   Triglycerides 75 0 - 149 mg/dL   HDL 61 >33 mg/dL   VLDL Cholesterol Cal 15 5 - 40 mg/dL   LDL Chol Calc (NIH) 73 0 - 99 mg/dL   Chol/HDL Ratio 2.4 0.0 - 4.4 ratio    Comment:                                   T. Chol/HDL Ratio                                             Men  Women  1/2 Avg.Risk  3.4    3.3                                   Avg.Risk  5.0    4.4                                2X Avg.Risk  9.6    7.1                                3X Avg.Risk 23.4   11.0   POCT CBG (Fasting - Glucose)     Status: Abnormal   Collection Time: 06/19/22 10:30 AM  Result Value Ref Range   Glucose Fasting, POC 137 (A) 70 - 99 mg/dL      Assessment & Plan:   As per problem list. DC Amlodipine with improvement in BP Problem List Items Addressed This Visit       Cardiovascular and Mediastinum   Hypertension     Other   Mixed hyperlipidemia   Relevant Orders   Lipid panel   Comprehensive metabolic panel   CK   Morbid obesity with body mass index (BMI) of 40.0 or higher (HCC)   Other Visit Diagnoses     Controlled type 2 diabetes mellitus without complication, without long-term current use of insulin (HCC)    -  Primary   Relevant Orders   POCT CBG (Fasting - Glucose) (Completed)   Hemoglobin A1c   POC CREATINE & ALBUMIN,URINE       Return in about 3 months (around 09/19/2022) for awv with  labs prior.   Total time spent: 20 minutes  Luna Fuse, MD  06/19/2022   This document may have been prepared by [facility] Voice Recognition software and as such may include unintentional dictation errors.

## 2022-06-21 DIAGNOSIS — M1000 Idiopathic gout, unspecified site: Secondary | ICD-10-CM | POA: Diagnosis not present

## 2022-06-21 DIAGNOSIS — I10 Essential (primary) hypertension: Secondary | ICD-10-CM | POA: Diagnosis not present

## 2022-06-21 DIAGNOSIS — R82998 Other abnormal findings in urine: Secondary | ICD-10-CM | POA: Diagnosis not present

## 2022-06-21 DIAGNOSIS — N281 Cyst of kidney, acquired: Secondary | ICD-10-CM | POA: Diagnosis not present

## 2022-06-21 DIAGNOSIS — G4733 Obstructive sleep apnea (adult) (pediatric): Secondary | ICD-10-CM | POA: Diagnosis not present

## 2022-06-21 DIAGNOSIS — E785 Hyperlipidemia, unspecified: Secondary | ICD-10-CM | POA: Diagnosis not present

## 2022-06-21 DIAGNOSIS — N1832 Chronic kidney disease, stage 3b: Secondary | ICD-10-CM | POA: Diagnosis not present

## 2022-06-21 DIAGNOSIS — E1122 Type 2 diabetes mellitus with diabetic chronic kidney disease: Secondary | ICD-10-CM | POA: Diagnosis not present

## 2022-07-11 ENCOUNTER — Ambulatory Visit (HOSPITAL_BASED_OUTPATIENT_CLINIC_OR_DEPARTMENT_OTHER): Payer: PPO | Admitting: Physical Therapy

## 2022-07-23 ENCOUNTER — Encounter (HOSPITAL_BASED_OUTPATIENT_CLINIC_OR_DEPARTMENT_OTHER): Payer: Self-pay

## 2022-07-23 ENCOUNTER — Ambulatory Visit (HOSPITAL_BASED_OUTPATIENT_CLINIC_OR_DEPARTMENT_OTHER): Payer: PPO | Admitting: Physical Therapy

## 2022-08-10 DIAGNOSIS — M25462 Effusion, left knee: Secondary | ICD-10-CM | POA: Diagnosis not present

## 2022-08-10 DIAGNOSIS — M1712 Unilateral primary osteoarthritis, left knee: Secondary | ICD-10-CM | POA: Diagnosis not present

## 2022-08-10 DIAGNOSIS — M25562 Pain in left knee: Secondary | ICD-10-CM | POA: Diagnosis not present

## 2022-08-10 DIAGNOSIS — R262 Difficulty in walking, not elsewhere classified: Secondary | ICD-10-CM | POA: Diagnosis not present

## 2022-08-16 ENCOUNTER — Ambulatory Visit (HOSPITAL_BASED_OUTPATIENT_CLINIC_OR_DEPARTMENT_OTHER): Payer: PPO | Admitting: Physical Therapy

## 2022-08-21 ENCOUNTER — Ambulatory Visit (HOSPITAL_BASED_OUTPATIENT_CLINIC_OR_DEPARTMENT_OTHER): Payer: PPO | Attending: Internal Medicine | Admitting: Physical Therapy

## 2022-08-21 DIAGNOSIS — R2689 Other abnormalities of gait and mobility: Secondary | ICD-10-CM | POA: Diagnosis not present

## 2022-08-21 DIAGNOSIS — G8929 Other chronic pain: Secondary | ICD-10-CM | POA: Insufficient documentation

## 2022-08-21 DIAGNOSIS — M6281 Muscle weakness (generalized): Secondary | ICD-10-CM | POA: Diagnosis not present

## 2022-08-21 DIAGNOSIS — M25561 Pain in right knee: Secondary | ICD-10-CM | POA: Insufficient documentation

## 2022-08-21 NOTE — Therapy (Addendum)
OUTPATIENT PHYSICAL THERAPY LOWER EXTREMITY EVALUATION   Patient Name: Kelly Farley MRN: [id number] DOB:12/18/1955, 67 y.o., female Today's Date: 08/21/2022  END OF SESSION:  PT End of Session - 08/21/22 1558     Visit Number 1    Number of Visits 16    Date for PT Re-Evaluation 10/16/22    PT Start Time 1207    PT Stop Time 1245    PT Time Calculation (min) 38 min    Activity Tolerance Patient tolerated treatment well    Behavior During Therapy [facility] for tasks assessed/performed             Past Medical History:  Diagnosis Date   Arthritis    Diabetes mellitus without complication (HCC)    Hyperlipemia    Hypertension    Other abnormal findings in urine 07/05/2021   Pain in right knee 02/14/2012   Palpitations    wore holter monitor 30 years ago; murmur no longer an issue per patient   Sleep apnea    Sweats, menopausal 10/20/2015   Past Surgical History:  Procedure Laterality Date   ABDOMINAL HYSTERECTOMY     total   BREAST BIOPSY Left 04/03/2022   stereo bx, calcs, RIBBON clip-path pending   BREAST BIOPSY Left 04/03/2022   MM LT BREAST BX W LOC DEV 1ST LESION IMAGE BX SPEC STEREO GUIDE 04/03/2022 [facility]   COLONOSCOPY WITH PROPOFOL N/A 08/18/2020   Procedure: COLONOSCOPY WITH PROPOFOL;  Surgeon: Pasty Spillers, MD;  Location: [facility];  Service: Endoscopy;  Laterality: N/A;   EYE SURGERY     JOINT REPLACEMENT  01/09/2012   right knee   TRANSFORAMINAL LUMBAR INTERBODY FUSION (TLIF) WITH PEDICLE SCREW FIXATION 2 LEVEL N/A 12/19/2021   Procedure: Lumbar Four- Five Lumbar Five-Sacral One Open laminectomy/Transforaminal Lumbar Interbody Fusion/Posterolateral instrumented fusion;  Surgeon: Jadene Pierini, MD;  Location: [facility];  Service: Neurosurgery;  Laterality: N/A;  RM 21/3C/TO FOLLOW   Patient Active Problem List   Diagnosis Date Noted   Morbid obesity with body mass index (BMI) of 40.0 or higher (HCC) 06/19/2022   Esophageal reflux  04/10/2022   Fusion of spine, lumbar region 04/10/2022   Spondylolisthesis of lumbar region 12/19/2021   Simple renal cyst 08/22/2021   Other obesity 07/05/2021   Stage 3b chronic kidney disease (HCC) 07/05/2021   Cecal polyp    Polyp of sigmoid colon    Chronic kidney disease (CKD) 07/16/2019   Mixed hyperlipidemia 07/16/2018   Bronchitis 01/20/2016   Idiopathic gout, left ankle and foot 01/20/2016   Menopausal and female climacteric states 10/20/2015   Obstructive sleep apnea 07/16/2014   Type 2 diabetes mellitus without complications (HCC) 11/17/2013   Edema 04/23/2011   Unspecified osteoarthritis, unspecified site 07/16/2002   Hypertension 07/16/1998    PCP: Sherron Monday, MD   REFERRING PROVIDER: Andrena Mews, DO   REFERRING DIAG:  Bilateral primary osteoarthritis of knee  M25.561 (ICD-10-CM) - Pain in right knee  R26.2 (ICD-10-CM) - Difficulty in walking, not elsewhere classified    THERAPY DIAG:  Chronic pain of right knee  Muscle weakness (generalized)  Other abnormalities of gait and mobility  Rationale for Evaluation and Treatment: Rehabilitation  ONSET DATE: exacerbated last year  SUBJECTIVE:   SUBJECTIVE STATEMENT: [place] Had bad experience with right knee.  Trying to avoid R TKR. I am retired.  Cared for parents.  I am limited with walking a lot.  I used my cane down here  use it as a precaution.  LB surgery in dec.  Sciatic pain in rle is gone. Right foot still has some numbness.  I think I could have walked another 100 ft before needing to sit. Use electric scooters in grocery stores although I would like to be able to walk through them.  I am walking a lot better since back surgery.  Walked in sand on beach this summer and it really hurt, increased pain left knee.  Right knee no pain just stiffness   PERTINENT HISTORY: Lumbar spondylolisthesis and stenosis : 2 level decompression and TLIF/PLF 12/21/21 PAIN:  Are you having  pain? Yes: NPRS scale: current 0-1/10; worst 4 /10, 0/10 Pain location: R knee Pain description: ache Aggravating factors: walking, getting and out of car Relieving factors: sitting,lyingdown, tylenol  PRECAUTIONS: Fall  RED FLAGS: None   WEIGHT BEARING RESTRICTIONS: No  FALLS:  Has patient fallen in last 6 months? No  LIVING ENVIRONMENT: Lives with: lives alone Lives in: House/apartment Stairs: Yes: External: 4-5 steps; on left going up Has following equipment at home: Single point cane  OCCUPATION: retired  PLOF: Independent  PATIENT GOALS: Walk through grocery store, going to foot ball game, vacuuming/house chores    OBJECTIVE:    PATIENT SURVEYS:  FOTO risk adjusted 42% with goal of 61%  COGNITION: Overall cognitive status: Within functional limits for tasks assessed     SENSATION: Around right toes and heel residual numbness due to LB dyfunction.  Has improved since surgery    MUSCLE LENGTH: Hamstrings: Full knee extension   POSTURE: weight shift left and ue and shoulder guarding  PALPATION: L knee crepitus  LOWER EXTREMITY ROM:  Active ROM Right eval Left eval  Hip flexion    Hip extension    Hip abduction    Hip adduction    Hip internal rotation    Hip external rotation    Knee flexion 112 92  Knee extension  -13  Ankle dorsiflexion    Ankle plantarflexion    Ankle inversion    Ankle eversion     (Blank rows = not tested)  LOWER EXTREMITY MMT:  MMT Right eval Left eval  Hip flexion 30.4 24.2  Hip extension    Hip abduction 26.2 22.8  Hip adduction    Hip internal rotation    Hip external rotation    Knee flexion    Knee extension 33.0 22.8  Ankle dorsiflexion    Ankle plantarflexion    Ankle inversion    Ankle eversion     (Blank rows = not tested)   FUNCTIONAL TESTS:  Timed up and go (TUG): 21.58 Berg Balance Scale: 28/56  GAIT: Distance walked: 400 ft Assistive device utilized: Single point cane Level of  assistance: Modified independence Comments: Shifted right, no left heel strike/ TKE left, antalgic limp   TODAY'S TREATMENT:  Eval Functional testing Foto   PATIENT EDUCATION:  Education details: Discussed eval findings, rehab rationale, aquatic program progression/POC and pools in area. Patient is in agreement  Person educated: Patient Education method: Explanation Education comprehension: verbalized understanding  HOME EXERCISE PROGRAM: TBA  ASSESSMENT:  CLINICAL IMPRESSION: Patient is a 67 y.o. f who was seen today for physical therapy evaluation and treatment for OA knee L.  She has had a R TKR x 10 yrs ago and a recent decompression and TLIF/PLF lumbar spine. She reports improved amb toleration and ability since LB surgery.  She presents today with le and core weakness. Functional testing positive for balance dysfunction/high fall risk and poor mobility. Pain left knee with crepitus. She has muscle weakness throughout her LE and core and gait deviation avoiding TKE left offsetting towards right.  She will benefit from skilled physical therapy intervention initially in aquatic setting for core and le strengthening, gait training/toleration, stair climbing then progress to land based for loaded strengthening, body mechanic instruction, gait training and HEP.  OBJECTIVE IMPAIRMENTS: Abnormal gait, decreased activity tolerance, decreased balance, decreased endurance, decreased mobility, difficulty walking, decreased ROM, decreased strength, improper body mechanics, obesity, and pain.   ACTIVITY LIMITATIONS: carrying, lifting, bending, standing, squatting, sleeping, stairs, transfers, and locomotion level  PARTICIPATION LIMITATIONS: meal prep, cleaning, laundry, shopping, community activity, and yard work  PERSONAL FACTORS: Age, Fitness, and 1-2 comorbidities: DM  and morbid obesity  are also affecting patient's functional outcome.   REHAB POTENTIAL: Good  CLINICAL DECISION MAKING: Evolving/moderate complexity  EVALUATION COMPLEXITY: Moderate   GOALS: Goals reviewed with patient? Yes  SHORT TERM GOALS: Target date: 09/18/22 Pt will tolerate full aquatic sessions consistently without increase in pain and with improving function to demonstrate good toleration and effectiveness of intervention.  Baseline: Goal status: INITIAL  2.  Pt will improve on Tug test to <or=  17 to demonstrate improvement in lower extremity function, mobility and decreased fall risk. Baseline: 21.58 Goal status: INITIAL  3.  Pt will improve with transfers in and out of car with increase in pain Baseline:  Goal status: INITIAL  4.  L knee flex to 100d and extension towards 0 for improved gait pattern Baseline: see chart Goal status: INITIAL  5.  Pt will report walking through grocery store pushing cart for up towards 25 minutes (personal goal) Baseline: electric cart Goal status: INITIAL    LONG TERM GOALS: Target date: 10/16/22  Pt to meet stated Foto Goal 61% Baseline: risk adjusted 42% Goal status: INITIAL  2.  Pt will be indep with final HEP's (land and aquatic as appropriate) for continued management of condition Baseline:  Goal status: INITIAL  3.  Pt will improve strength in all areas listed by at least 10 lbs to demonstrate improved overall physical function  Baseline:  Goal status: INITIAL  4.  Pt will improve on Berg balance test to >/=45/56 to demonstrate a decrease in fall risk. Baseline: 28/56 Goal status: INITIAL  5.  Pt will report toleration to vacuuming and other household chores Baseline: avoids Goal status: INITIAL  6.  Pt will have a reduction in her worst pain to 4/10 or less to demonstrate improvement management of condition Baseline:  Goal status: INITIAL   PLAN:  PT FREQUENCY: 1-2x/week  PT DURATION: 8 weeks  PLANNED  INTERVENTIONS: Therapeutic exercises, Therapeutic activity, Neuromuscular re-education, Balance training, Gait training, Patient/Family education, Self Care, Joint mobilization, Stair training, Orthotic/Fit training, DME instructions, Aquatic Therapy, Dry Needling, Electrical stimulation, Moist heat,  Taping, Ionotophoresis 4mg /ml Dexamethasone, Manual therapy, and Re-evaluation  PLAN FOR NEXT SESSION: aquatic setting for core and le strengthening, gait training/toleration, stair climbing  land based for loaded strengthening, Optometrist instruction, gait training and HEP   Geni Bers, PT 08/21/2022, 3:59 PM   Addend Corrie Dandy Tomma Lightning) [place] Greb MPT 10/22/22 123pm

## 2022-09-08 ENCOUNTER — Other Ambulatory Visit: Payer: Self-pay | Admitting: Internal Medicine

## 2022-09-08 DIAGNOSIS — E785 Hyperlipidemia, unspecified: Secondary | ICD-10-CM

## 2022-09-17 ENCOUNTER — Ambulatory Visit: Payer: PPO | Admitting: Internal Medicine

## 2022-09-18 ENCOUNTER — Encounter (HOSPITAL_BASED_OUTPATIENT_CLINIC_OR_DEPARTMENT_OTHER): Payer: Self-pay | Admitting: Physical Therapy

## 2022-09-18 ENCOUNTER — Ambulatory Visit (HOSPITAL_BASED_OUTPATIENT_CLINIC_OR_DEPARTMENT_OTHER): Payer: PPO | Attending: Internal Medicine | Admitting: Physical Therapy

## 2022-09-18 DIAGNOSIS — M25561 Pain in right knee: Secondary | ICD-10-CM | POA: Insufficient documentation

## 2022-09-18 DIAGNOSIS — M25562 Pain in left knee: Secondary | ICD-10-CM | POA: Diagnosis not present

## 2022-09-18 DIAGNOSIS — G8929 Other chronic pain: Secondary | ICD-10-CM | POA: Insufficient documentation

## 2022-09-18 DIAGNOSIS — M6281 Muscle weakness (generalized): Secondary | ICD-10-CM | POA: Diagnosis not present

## 2022-09-18 DIAGNOSIS — R2689 Other abnormalities of gait and mobility: Secondary | ICD-10-CM | POA: Diagnosis not present

## 2022-09-18 NOTE — Therapy (Signed)
OUTPATIENT PHYSICAL THERAPY LOWER EXTREMITY TREATMENT   Patient Name: Kelly Farley MRN: [id number] DOB:10-27-1955, 67 y.o., female Today's Date: 09/18/2022  END OF SESSION:  PT End of Session - 09/18/22 1403     Visit Number 2    Number of Visits 16    Date for PT Re-Evaluation 10/16/22    PT Start Time 1401    PT Stop Time 1440    PT Time Calculation (min) 39 min    Activity Tolerance Patient tolerated treatment well    Behavior During Therapy [facility] for tasks assessed/performed             Past Medical History:  Diagnosis Date   Arthritis    Diabetes mellitus without complication (HCC)    Hyperlipemia    Hypertension    Other abnormal findings in urine 07/05/2021   Pain in right knee 02/14/2012   Palpitations    wore holter monitor 30 years ago; murmur no longer an issue per patient   Sleep apnea    Sweats, menopausal 10/20/2015   Past Surgical History:  Procedure Laterality Date   ABDOMINAL HYSTERECTOMY     total   BREAST BIOPSY Left 04/03/2022   stereo bx, calcs, RIBBON clip-path pending   BREAST BIOPSY Left 04/03/2022   MM LT BREAST BX W LOC DEV 1ST LESION IMAGE BX SPEC STEREO GUIDE 04/03/2022 [facility]   COLONOSCOPY WITH PROPOFOL N/A 08/18/2020   Procedure: COLONOSCOPY WITH PROPOFOL;  Surgeon: Pasty Spillers, MD;  Location: [facility];  Service: Endoscopy;  Laterality: N/A;   EYE SURGERY     JOINT REPLACEMENT  01/09/2012   right knee   TRANSFORAMINAL LUMBAR INTERBODY FUSION (TLIF) WITH PEDICLE SCREW FIXATION 2 LEVEL N/A 12/19/2021   Procedure: Lumbar Four- Five Lumbar Five-Sacral One Open laminectomy/Transforaminal Lumbar Interbody Fusion/Posterolateral instrumented fusion;  Surgeon: Jadene Pierini, MD;  Location: [facility];  Service: Neurosurgery;  Laterality: N/A;  RM 21/3C/TO FOLLOW   Patient Active Problem List   Diagnosis Date Noted   Morbid obesity with body mass index (BMI) of 40.0 or higher (HCC) 06/19/2022   Esophageal reflux  04/10/2022   Fusion of spine, lumbar region 04/10/2022   Spondylolisthesis of lumbar region 12/19/2021   Simple renal cyst 08/22/2021   Other obesity 07/05/2021   Stage 3b chronic kidney disease (HCC) 07/05/2021   Cecal polyp    Polyp of sigmoid colon    Chronic kidney disease (CKD) 07/16/2019   Mixed hyperlipidemia 07/16/2018   Bronchitis 01/20/2016   Idiopathic gout, left ankle and foot 01/20/2016   Menopausal and female climacteric states 10/20/2015   Obstructive sleep apnea 07/16/2014   Type 2 diabetes mellitus without complications (HCC) 11/17/2013   Edema 04/23/2011   Unspecified osteoarthritis, unspecified site 07/16/2002   Hypertension 07/16/1998    PCP: Sherron Monday, MD   REFERRING PROVIDER: Andrena Mews, DO   REFERRING DIAG:  Bilateral primary osteoarthritis of knee  M25.561 (ICD-10-CM) - Pain in right knee  R26.2 (ICD-10-CM) - Difficulty in walking, not elsewhere classified    THERAPY DIAG:  Chronic pain of right knee  Muscle weakness (generalized)  Other abnormalities of gait and mobility  Rationale for Evaluation and Treatment: Rehabilitation  ONSET DATE: exacerbated last year  SUBJECTIVE:   SUBJECTIVE STATEMENT: Pt reports that she has been active. "Just returned from funeral in [place]"   EVAL:[place] with right [place].  Trying to avoid L TKR. I am retired.  Cared for parents.  I am limited with walking a lot.  I used my cane down here use it as a precaution.  LB surgery in dec.  Sciatic pain in rle is gone. Right foot still has some numbness.  I think I could have walked another 100 ft before needing to sit. Use electric scooters in grocery stores although I would like to be able to walk through them.  I am walking a lot better since back surgery.  Walked in sand on beach this summer and it really hurt, increased pain left knee.  Right knee no pain just stiffness   PERTINENT HISTORY: Lumbar spondylolisthesis  and stenosis : 2 level decompression and TLIF/PLF 12/21/21 PAIN:  Are you having pain?no : NPRS scale: current 0/10 Pain location: l Pain description:  Aggravating factors: walking, getting and out of car Relieving factors: sitting,lyingdown, tylenol  PRECAUTIONS: Fall  RED FLAGS: None   WEIGHT BEARING RESTRICTIONS: No  FALLS:  Has patient fallen in last 6 months? No  LIVING ENVIRONMENT: Lives with: lives alone Lives in: House/apartment Stairs: Yes: External: 4-5 steps; on left going up Has following equipment at home: Single point cane  OCCUPATION: retired  PLOF: Independent  PATIENT GOALS: Walk through grocery store, going to foot ball game, vacuuming/house chores    OBJECTIVE:    PATIENT SURVEYS:  FOTO risk adjusted 42% with goal of 61%  COGNITION: Overall cognitive status: Within functional limits for tasks assessed     SENSATION: Around right toes and heel residual numbness due to LB dyfunction.  Has improved since surgery    MUSCLE LENGTH: Hamstrings: Full knee extension   POSTURE: weight shift left and ue and shoulder guarding  PALPATION: L knee crepitus  LOWER EXTREMITY ROM:  Active ROM Right eval Left eval  Hip flexion    Hip extension    Hip abduction    Hip adduction    Hip internal rotation    Hip external rotation    Knee flexion 112 92  Knee extension  -13  Ankle dorsiflexion    Ankle plantarflexion    Ankle inversion    Ankle eversion     (Blank rows = not tested)  LOWER EXTREMITY MMT:  MMT Right eval Left eval  Hip flexion 30.4 24.2  Hip extension    Hip abduction 26.2 22.8  Hip adduction    Hip internal rotation    Hip external rotation    Knee flexion    Knee extension 33.0 22.8  Ankle dorsiflexion    Ankle plantarflexion    Ankle inversion    Ankle eversion     (Blank rows = not tested)   FUNCTIONAL TESTS:  Timed up and go (TUG): 21.58 Berg Balance Scale: 28/56  GAIT: Distance walked: 400  ft Assistive device utilized: Single point cane Level of assistance: Modified independence Comments: Shifted right, no left heel strike/ TKE left, antalgic limp   TODAY'S TREATMENT:  Pt seen for aquatic therapy today.  Treatment took place in water 3.5-4.75 ft in depth at the [facility] pool. Temp of water was 91.  Pt entered/exited the pool via stairs in step-to pattern with bilat rail. *reacquainting to aquatic therapy properties/ principles * holding wall:  side stepping L/R 26ft * holding barbell next to wall:  walking forward/ backward 48ft, multiple reps * holding wall in 4+ ft:  heel raises; high knee marching, hip openers and crosses;  hip abdct/ addct 2 sets of 5 * back near wall: short hollow noodle pull downs to thighs x 10  * seated in lift chair: cycling; hip abdct/ addct  Pt requires the buoyancy and hydrostatic pressure of water for support, and to offload joints by unweighting joint load by at least 50 % in navel deep water and by at least 75-80% in chest to neck deep water.  Viscosity of the water is needed for resistance of strengthening. Water current perturbations provides challenge to standing balance requiring increased core activation.    PATIENT EDUCATION:  Education details: Discussed eval findings, rehab rationale, aquatic program progression/POC and pools in area. Patient is in agreement  Person educated: Patient Education method: Explanation Education comprehension: verbalized understanding  HOME EXERCISE PROGRAM: TBA  ASSESSMENT:  CLINICAL IMPRESSION: Pt required UE support on wall or floatation device.  She was not quite comfortable walking across width of pool yet. She is observed moving slowly and cautiously but acclimated well, as long as she was nearby wall.  She tolerated exercises well without any increase in pain.   May have therapist in water next visit to assist in increased confidence in water.  This was first visit since eval; Goals are ongoing.      EVAL: Patient is a 67 y.o. f who was seen today for physical therapy evaluation and treatment for OA knee L.  She has had a R TKR x 10 yrs ago and a recent decompression and TLIF/PLF lumbar spine. She reports improved amb toleration and ability since LB surgery.  She presents today with le and core weakness. Functional testing positive for balance dysfunction/high fall risk and poor mobility. Pain left knee with crepitus. She has muscle weakness throughout her LE and core and gait deviation avoiding TKE left offsetting towards right.  She will benefit from skilled physical therapy intervention initially in aquatic setting for core and le strengthening, gait training/toleration, stair climbing then progress to land based for loaded strengthening, body mechanic instruction, gait training and HEP.  OBJECTIVE IMPAIRMENTS: Abnormal gait, decreased activity tolerance, decreased balance, decreased endurance, decreased mobility, difficulty walking, decreased ROM, decreased strength, improper body mechanics, obesity, and pain.   ACTIVITY LIMITATIONS: carrying, lifting, bending, standing, squatting, sleeping, stairs, transfers, and locomotion level  PARTICIPATION LIMITATIONS: meal prep, cleaning, laundry, shopping, community activity, and yard work  PERSONAL FACTORS: Age, Fitness, and 1-2 comorbidities: DM and morbid obesity  are also affecting patient's functional outcome.   REHAB POTENTIAL: Good  CLINICAL DECISION MAKING: Evolving/moderate complexity  EVALUATION COMPLEXITY: Moderate   GOALS: Goals reviewed with patient? Yes  SHORT TERM GOALS: Target date: 09/18/22 Pt will tolerate full aquatic sessions consistently without increase in pain and with improving function to demonstrate good toleration and effectiveness of intervention.  Baseline: Goal status:  INITIAL  2.  Pt will improve on Tug test to <or=  17 to demonstrate improvement in lower extremity function, mobility and decreased fall risk. Baseline: 21.58 Goal status: INITIAL  3.  Pt will improve with transfers  in and out of car with increase in pain Baseline:  Goal status: INITIAL  4.  L knee flex to 100d and extension towards 0 for improved gait pattern Baseline: see chart Goal status: INITIAL  5.  Pt will report walking through grocery store pushing cart for up towards 25 minutes (personal goal) Baseline: electric cart Goal status: INITIAL    LONG TERM GOALS: Target date: 10/16/22  Pt to meet stated Foto Goal 61% Baseline: risk adjusted 42% Goal status: INITIAL  2.  Pt will be indep with final HEP's (land and aquatic as appropriate) for continued management of condition Baseline:  Goal status: INITIAL  3.  Pt will improve strength in all areas listed by at least 10 lbs to demonstrate improved overall physical function  Baseline:  Goal status: INITIAL  4.  Pt will improve on Berg balance test to >/=45/56 to demonstrate a decrease in fall risk. Baseline: 28/56 Goal status: INITIAL  5.  Pt will report toleration to vacuuming and other household chores Baseline: avoids Goal status: INITIAL  6.  Pt will have a reduction in her worst pain to 4/10 or less to demonstrate improvement management of condition Baseline:  Goal status: INITIAL   PLAN:  PT FREQUENCY: 1-2x/week  PT DURATION: 8 weeks  PLANNED INTERVENTIONS: Therapeutic exercises, Therapeutic activity, Neuromuscular re-education, Balance training, Gait training, Patient/Family education, Self Care, Joint mobilization, Stair training, Orthotic/Fit training, DME instructions, Aquatic Therapy, Dry Needling, Electrical stimulation, Moist heat, Taping, Ionotophoresis 4mg /ml Dexamethasone, Manual therapy, and Re-evaluation  PLAN FOR NEXT SESSION: aquatic setting for core and le strengthening, gait  training/toleration, stair climbing  land based for loaded strengthening, Optometrist instruction, gait training and HEP  Mayer Camel, PTA 09/18/22 2:54 PM [facility] [address] [place], [place], [address] Phone: [phone]   Fax:  [phone]

## 2022-09-20 ENCOUNTER — Ambulatory Visit (HOSPITAL_BASED_OUTPATIENT_CLINIC_OR_DEPARTMENT_OTHER): Payer: PPO | Admitting: Physical Therapy

## 2022-09-20 ENCOUNTER — Encounter (HOSPITAL_BASED_OUTPATIENT_CLINIC_OR_DEPARTMENT_OTHER): Payer: Self-pay | Admitting: Physical Therapy

## 2022-09-20 DIAGNOSIS — M6281 Muscle weakness (generalized): Secondary | ICD-10-CM

## 2022-09-20 DIAGNOSIS — R2689 Other abnormalities of gait and mobility: Secondary | ICD-10-CM

## 2022-09-20 DIAGNOSIS — M25561 Pain in right knee: Secondary | ICD-10-CM | POA: Diagnosis not present

## 2022-09-20 DIAGNOSIS — G8929 Other chronic pain: Secondary | ICD-10-CM

## 2022-09-20 NOTE — Therapy (Signed)
OUTPATIENT PHYSICAL THERAPY LOWER EXTREMITY TREATMENT   Patient Name: Kelly Farley MRN: [id number] DOB:[birth year], 67 y.o., female Today's Date: 09/20/2022  END OF SESSION:  PT End of Session - 09/20/22 1355     Visit Number 3    Number of Visits 16    Date for PT Re-Evaluation 10/16/22    PT Start Time 1357    PT Stop Time 1436    PT Time Calculation (min) 39 min    Behavior During Therapy [facility] for tasks assessed/performed             Past Medical History:  Diagnosis Date   Arthritis    Diabetes mellitus without complication (HCC)    Hyperlipemia    Hypertension    Other abnormal findings in urine 07/05/2021   Pain in right knee 02/14/2012   Palpitations    wore holter monitor 30 years ago; murmur no longer an issue per patient   Sleep apnea    Sweats, menopausal 10/20/2015   Past Surgical History:  Procedure Laterality Date   ABDOMINAL HYSTERECTOMY     total   BREAST BIOPSY Left 04/03/2022   stereo bx, calcs, RIBBON clip-path pending   BREAST BIOPSY Left 04/03/2022   MM LT BREAST BX W LOC DEV 1ST LESION IMAGE BX SPEC STEREO GUIDE 04/03/2022 [facility]   COLONOSCOPY WITH PROPOFOL N/A 08/18/2020   Procedure: COLONOSCOPY WITH PROPOFOL;  Surgeon: Pasty Spillers, MD;  Location: [facility];  Service: Endoscopy;  Laterality: N/A;   EYE SURGERY     JOINT REPLACEMENT  01/09/2012   right knee   TRANSFORAMINAL LUMBAR INTERBODY FUSION (TLIF) WITH PEDICLE SCREW FIXATION 2 LEVEL N/A 12/19/2021   Procedure: Lumbar Four- Five Lumbar Five-Sacral One Open laminectomy/Transforaminal Lumbar Interbody Fusion/Posterolateral instrumented fusion;  Surgeon: Jadene Pierini, MD;  Location: [facility];  Service: Neurosurgery;  Laterality: N/A;  RM 21/3C/TO FOLLOW   Patient Active Problem List   Diagnosis Date Noted   Morbid obesity with body mass index (BMI) of 40.0 or higher (HCC) 06/19/2022   Esophageal reflux 04/10/2022   Fusion of spine, lumbar region 04/10/2022    Spondylolisthesis of lumbar region 12/19/2021   Simple renal cyst 08/22/2021   Other obesity 07/05/2021   Stage 3b chronic kidney disease (HCC) 07/05/2021   Cecal polyp    Polyp of sigmoid colon    Chronic kidney disease (CKD) 07/16/2019   Mixed hyperlipidemia 07/16/2018   Bronchitis 01/20/2016   Idiopathic gout, left ankle and foot 01/20/2016   Menopausal and female climacteric states 10/20/2015   Obstructive sleep apnea 07/16/2014   Type 2 diabetes mellitus without complications (HCC) 11/17/2013   Edema 04/23/2011   Unspecified osteoarthritis, unspecified site 07/16/2002   Hypertension 07/16/1998    PCP: Sherron Monday, MD   REFERRING PROVIDER: Andrena Mews, DO   REFERRING DIAG:  Bilateral primary osteoarthritis of knee  M25.561 (ICD-10-CM) - Pain in right knee  R26.2 (ICD-10-CM) - Difficulty in walking, not elsewhere classified    THERAPY DIAG:  Chronic pain of right knee  Muscle weakness (generalized)  Other abnormalities of gait and mobility  Chronic pain of left knee  Rationale for Evaluation and Treatment: Rehabilitation  ONSET DATE: exacerbated last year  SUBJECTIVE:   SUBJECTIVE STATEMENT: Pt reports that she was sore after last session.    EVAL:[place] Had bad experience with right knee.  Trying to avoid L TKR. I am retired.  Cared for parents.  I am limited with walking  a lot.  I used my cane down here use it as a precaution.  LB surgery in dec.  Sciatic pain in rle is gone. Right foot still has some numbness.  I think I could have walked another 100 ft before needing to sit. Use electric scooters in grocery stores although I would like to be able to walk through them.  I am walking a lot better since back surgery.  Walked in sand on beach this summer and it really hurt, increased pain left knee.  Right knee no pain just stiffness   PERTINENT HISTORY: Lumbar spondylolisthesis and stenosis : 2 level decompression and TLIF/PLF  12/21/21 PAIN:  Are you having pain?no : NPRS scale: current 0/10 Pain location:  Pain description:  Aggravating factors: walking, getting and out of car Relieving factors: sitting,lyingdown, tylenol  PRECAUTIONS: Fall  RED FLAGS: None   WEIGHT BEARING RESTRICTIONS: No  FALLS:  Has patient fallen in last 6 months? No  LIVING ENVIRONMENT: Lives with: lives alone Lives in: House/apartment Stairs: Yes: External: 4-5 steps; on left going up Has following equipment at home: Single point cane  OCCUPATION: retired  PLOF: Independent  PATIENT GOALS: Walk through grocery store, going to foot ball game, vacuuming/house chores    OBJECTIVE:    PATIENT SURVEYS:  FOTO risk adjusted 42% with goal of 61%  COGNITION: Overall cognitive status: Within functional limits for tasks assessed     SENSATION: Around right toes and heel residual numbness due to LB dyfunction.  Has improved since surgery    MUSCLE LENGTH: Hamstrings: Full knee extension   POSTURE: weight shift left and ue and shoulder guarding  PALPATION: L knee crepitus  LOWER EXTREMITY ROM:  Active ROM Right eval Left eval  Hip flexion    Hip extension    Hip abduction    Hip adduction    Hip internal rotation    Hip external rotation    Knee flexion 112 92  Knee extension  -13  Ankle dorsiflexion    Ankle plantarflexion    Ankle inversion    Ankle eversion     (Blank rows = not tested)  LOWER EXTREMITY MMT:  MMT Right eval Left eval  Hip flexion 30.4 24.2  Hip extension    Hip abduction 26.2 22.8  Hip adduction    Hip internal rotation    Hip external rotation    Knee flexion    Knee extension 33.0 22.8  Ankle dorsiflexion    Ankle plantarflexion    Ankle inversion    Ankle eversion     (Blank rows = not tested)   FUNCTIONAL TESTS:  Timed up and go (TUG): 21.58 Berg Balance Scale: 28/56  GAIT: Distance walked: 400 ft Assistive device utilized: Single point cane Level of  assistance: Modified independence Comments: Shifted right, no left heel strike/ TKE left, antalgic limp   TODAY'S TREATMENT:  Pt seen for aquatic therapy today.  Treatment took place in water 3.5-4.75 ft in depth at the [facility] pool. Temp of water was 91.  Pt entered/exited the pool via stairs in step-to pattern with bilat rail. Therapist in water with pt to assist in acclimating to aquatic environment and increasing confidence  * holding barbell (therapist with SBA):  walking forward/ backward, multiple reps (feels better in RLE if facing hot tub vs lap pool); side stepping R/L multiple laps * holding wall in 4+ ft: relaxed squat for rest; squats x 5 * marching with row motion with hand floats, forward/ backwards * hollow noodle pull downs to thighs x 5 in standard stance, staggered stance x 5 each * seated on bench with feet on blue step: STS with forward arm reach x 10 *  shoulder addct/abdct with rainbow hand floats -> then with added side step x 2 laps  Pt requires the buoyancy and hydrostatic pressure of water for support, and to offload joints by unweighting joint load by at least 50 % in navel deep water and by at least 75-80% in chest to neck deep water.  Viscosity of the water is needed for resistance of strengthening. Water current perturbations provides challenge to standing balance requiring increased core activation.    PATIENT EDUCATION:  Education details: aquatic exercise progressions/modifications  Person educated: Patient Education method: Explanation Education comprehension: verbalized understanding  HOME EXERCISE PROGRAM: TBA  ASSESSMENT:  CLINICAL IMPRESSION: Therapist was in water today to assist in acclimating pt to aquatic environment; SBA progressed to supervision.  Pt was able to walk across width of pool multiple times  with UE support on floatation.  She was challenged with staggered stance position and with arm addct with side stepping.  Pt tolerated session well, without loss of balance or increase in pain.  Pt is progressing towards goals.   EVAL: Patient is a 67 y.o. f who was seen today for physical therapy evaluation and treatment for OA knee L.  She has had a R TKR x 10 yrs ago and a recent decompression and TLIF/PLF lumbar spine. She reports improved amb toleration and ability since LB surgery.  She presents today with le and core weakness. Functional testing positive for balance dysfunction/high fall risk and poor mobility. Pain left knee with crepitus. She has muscle weakness throughout her LE and core and gait deviation avoiding TKE left offsetting towards right.  She will benefit from skilled physical therapy intervention initially in aquatic setting for core and le strengthening, gait training/toleration, stair climbing then progress to land based for loaded strengthening, body mechanic instruction, gait training and HEP.  OBJECTIVE IMPAIRMENTS: Abnormal gait, decreased activity tolerance, decreased balance, decreased endurance, decreased mobility, difficulty walking, decreased ROM, decreased strength, improper body mechanics, obesity, and pain.   ACTIVITY LIMITATIONS: carrying, lifting, bending, standing, squatting, sleeping, stairs, transfers, and locomotion level  PARTICIPATION LIMITATIONS: meal prep, cleaning, laundry, shopping, community activity, and yard work  PERSONAL FACTORS: Age, Fitness, and 1-2 comorbidities: DM and morbid obesity  are also affecting patient's functional outcome.   REHAB POTENTIAL: Good  CLINICAL DECISION MAKING: Evolving/moderate complexity  EVALUATION COMPLEXITY: Moderate   GOALS: Goals reviewed with patient? Yes  SHORT TERM GOALS: Target date: 09/18/22 Pt will tolerate full aquatic sessions consistently without increase in pain and with improving function to  demonstrate good toleration and effectiveness of intervention.  Baseline: Goal status: INITIAL  2.  Pt will improve on Tug test to <or=  17 to demonstrate improvement  in lower extremity function, mobility and decreased fall risk. Baseline: 21.58 Goal status: INITIAL  3.  Pt will improve with transfers in and out of car with increase in pain Baseline:  Goal status: INITIAL  4.  L knee flex to 100d and extension towards 0 for improved gait pattern Baseline: see chart Goal status: INITIAL  5.  Pt will report walking through grocery store pushing cart for up towards 25 minutes (personal goal) Baseline: electric cart Goal status: INITIAL    LONG TERM GOALS: Target date: 10/16/22  Pt to meet stated Foto Goal 61% Baseline: risk adjusted 42% Goal status: INITIAL  2.  Pt will be indep with final HEP's (land and aquatic as appropriate) for continued management of condition Baseline:  Goal status: INITIAL  3.  Pt will improve strength in all areas listed by at least 10 lbs to demonstrate improved overall physical function  Baseline:  Goal status: INITIAL  4.  Pt will improve on Berg balance test to >/=45/56 to demonstrate a decrease in fall risk. Baseline: 28/56 Goal status: INITIAL  5.  Pt will report toleration to vacuuming and other household chores Baseline: avoids Goal status: INITIAL  6.  Pt will have a reduction in her worst pain to 4/10 or less to demonstrate improvement management of condition Baseline:  Goal status: INITIAL   PLAN:  PT FREQUENCY: 1-2x/week  PT DURATION: 8 weeks  PLANNED INTERVENTIONS: Therapeutic exercises, Therapeutic activity, Neuromuscular re-education, Balance training, Gait training, Patient/Family education, Self Care, Joint mobilization, Stair training, Orthotic/Fit training, DME instructions, Aquatic Therapy, Dry Needling, Electrical stimulation, Moist heat, Taping, Ionotophoresis 4mg /ml Dexamethasone, Manual therapy, and  Re-evaluation  PLAN FOR NEXT SESSION: aquatic setting for core and le strengthening, gait training/toleration, stair climbing  land based for loaded strengthening, Optometrist instruction, gait training and HEP  Mayer Camel, PTA 09/20/22 4:53 PM [facility] [address] [place], [place], [address] Phone: [phone]   Fax:  [phone]

## 2022-09-25 ENCOUNTER — Encounter (HOSPITAL_BASED_OUTPATIENT_CLINIC_OR_DEPARTMENT_OTHER): Payer: Self-pay | Admitting: Physical Therapy

## 2022-09-25 ENCOUNTER — Ambulatory Visit (HOSPITAL_BASED_OUTPATIENT_CLINIC_OR_DEPARTMENT_OTHER): Payer: PPO | Admitting: Physical Therapy

## 2022-09-25 DIAGNOSIS — M25561 Pain in right knee: Secondary | ICD-10-CM | POA: Diagnosis not present

## 2022-09-25 DIAGNOSIS — G8929 Other chronic pain: Secondary | ICD-10-CM

## 2022-09-25 DIAGNOSIS — M6281 Muscle weakness (generalized): Secondary | ICD-10-CM

## 2022-09-25 DIAGNOSIS — R2689 Other abnormalities of gait and mobility: Secondary | ICD-10-CM

## 2022-09-25 NOTE — Therapy (Signed)
OUTPATIENT PHYSICAL THERAPY LOWER EXTREMITY TREATMENT   Patient Name: Kelly Farley MRN: [id number] DOB:[birth year], 67 y.o., female Today's Date: 09/25/2022  END OF SESSION:  PT End of Session - 09/25/22 1356     Visit Number 4    Number of Visits 16    Date for PT Re-Evaluation 10/16/22    PT Start Time 1400    PT Stop Time 1440    PT Time Calculation (min) 40 min    Behavior During Therapy [facility] for tasks assessed/performed             Past Medical History:  Diagnosis Date   Arthritis    Diabetes mellitus without complication (HCC)    Hyperlipemia    Hypertension    Other abnormal findings in urine 07/05/2021   Pain in right knee 02/14/2012   Palpitations    wore holter monitor 30 years ago; murmur no longer an issue per patient   Sleep apnea    Sweats, menopausal 10/20/2015   Past Surgical History:  Procedure Laterality Date   ABDOMINAL HYSTERECTOMY     total   BREAST BIOPSY Left 04/03/2022   stereo bx, calcs, RIBBON clip-path pending   BREAST BIOPSY Left 04/03/2022   MM LT BREAST BX W LOC DEV 1ST LESION IMAGE BX SPEC STEREO GUIDE 04/03/2022 [facility]   COLONOSCOPY WITH PROPOFOL N/A 08/18/2020   Procedure: COLONOSCOPY WITH PROPOFOL;  Surgeon: Pasty Spillers, MD;  Location: [facility];  Service: Endoscopy;  Laterality: N/A;   EYE SURGERY     JOINT REPLACEMENT  01/09/2012   right knee   TRANSFORAMINAL LUMBAR INTERBODY FUSION (TLIF) WITH PEDICLE SCREW FIXATION 2 LEVEL N/A 12/19/2021   Procedure: Lumbar Four- Five Lumbar Five-Sacral One Open laminectomy/Transforaminal Lumbar Interbody Fusion/Posterolateral instrumented fusion;  Surgeon: Jadene Pierini, MD;  Location: [facility];  Service: Neurosurgery;  Laterality: N/A;  RM 21/3C/TO FOLLOW   Patient Active Problem List   Diagnosis Date Noted   Morbid obesity with body mass index (BMI) of 40.0 or higher (HCC) 06/19/2022   Esophageal reflux 04/10/2022   Fusion of spine, lumbar region 04/10/2022    Spondylolisthesis of lumbar region 12/19/2021   Simple renal cyst 08/22/2021   Other obesity 07/05/2021   Stage 3b chronic kidney disease (HCC) 07/05/2021   Cecal polyp    Polyp of sigmoid colon    Chronic kidney disease (CKD) 07/16/2019   Mixed hyperlipidemia 07/16/2018   Bronchitis 01/20/2016   Idiopathic gout, left ankle and foot 01/20/2016   Menopausal and female climacteric states 10/20/2015   Obstructive sleep apnea 07/16/2014   Type 2 diabetes mellitus without complications (HCC) 11/17/2013   Edema 04/23/2011   Unspecified osteoarthritis, unspecified site 07/16/2002   Hypertension 07/16/1998    PCP: Sherron Monday, MD   REFERRING PROVIDER: Andrena Mews, DO   REFERRING DIAG:  Bilateral primary osteoarthritis of knee  M25.561 (ICD-10-CM) - Pain in right knee  R26.2 (ICD-10-CM) - Difficulty in walking, not elsewhere classified    THERAPY DIAG:  Chronic pain of right knee  Muscle weakness (generalized)  Other abnormalities of gait and mobility  Chronic pain of left knee  Rationale for Evaluation and Treatment: Rehabilitation  ONSET DATE: exacerbated last year  SUBJECTIVE:   SUBJECTIVE STATEMENT: Pt reports that she wasn't as sore as last session.  She only has pain in Lt heel when she is walking; up to 4-5/10.  "I've been doing more".    EVAL[place] with  right knee.  Trying to avoid L TKR. I am retired.  Cared for parents.  I am limited with walking a lot.  I used my cane down here use it as a precaution.  LB surgery in dec.  Sciatic pain in rle is gone. Right foot still has some numbness.  I think I could have walked another 100 ft before needing to sit. Use electric scooters in grocery stores although I would like to be able to walk through them.  I am walking a lot better since back surgery.  Walked in sand on beach this summer and it really hurt, increased pain left knee.  Right knee no pain just stiffness   PERTINENT  HISTORY: Lumbar spondylolisthesis and stenosis : 2 level decompression and TLIF/PLF 12/21/21 PAIN:  Are you having pain?no : NPRS scale: current 0/10- at rest  Pain location:  Pain description:  Aggravating factors: walking, getting and out of car Relieving factors: sitting,lyingdown, tylenol  PRECAUTIONS: Fall  RED FLAGS: None   WEIGHT BEARING RESTRICTIONS: No  FALLS:  Has patient fallen in last 6 months? No  LIVING ENVIRONMENT: Lives with: lives alone Lives in: House/apartment Stairs: Yes: External: 4-5 steps; on left going up Has following equipment at home: Single point cane  OCCUPATION: retired  PLOF: Independent  PATIENT GOALS: Walk through grocery store, going to foot ball game, vacuuming/house chores    OBJECTIVE:    PATIENT SURVEYS:  FOTO risk adjusted 42% with goal of 61%  COGNITION: Overall cognitive status: Within functional limits for tasks assessed     SENSATION: Around right toes and heel residual numbness due to LB dyfunction.  Has improved since surgery    MUSCLE LENGTH: Hamstrings: Full knee extension   POSTURE: weight shift left and ue and shoulder guarding  PALPATION: L knee crepitus  LOWER EXTREMITY ROM:  Active ROM Right eval Left eval Left  9/17  Hip flexion     Hip extension     Hip abduction     Hip adduction     Hip internal rotation     Hip external rotation     Knee flexion 112 92 92  Knee extension  -13 -17  Ankle dorsiflexion     Ankle plantarflexion     Ankle inversion     Ankle eversion      (Blank rows = not tested)  LOWER EXTREMITY MMT:  MMT Right eval Left eval Lt    Hip flexion 30.4 24.2   Hip extension     Hip abduction 26.2 22.8   Hip adduction     Hip internal rotation     Hip external rotation     Knee flexion     Knee extension 33.0 22.8   Ankle dorsiflexion     Ankle plantarflexion     Ankle inversion     Ankle eversion      (Blank rows = not tested)   FUNCTIONAL TESTS:  Timed  up and go (TUG): 21.58 Berg Balance Scale: 28/56 09/25/22: TUG : 18.96s without cane   GAIT: Distance walked: 400 ft Assistive device utilized: Single point cane Level of assistance: Modified independence Comments: Shifted right, no left heel strike/ TKE left, antalgic limp   TODAY'S TREATMENT:  Pt seen for aquatic therapy today.  Treatment took place in water 3.5-4.75 ft in depth at the [facility] pool. Temp of water was 91.  Pt entered/exited the pool via stairs in step-to pattern with bilat rail. Pt feels better facing hot tub.   * holding noodle:  walking forward/ backward,  * wide stance with arm addct with rainbow hand floats x 5, then side stepping with arm addct with rainbow hand floats * row motion with rainbow hand floats with marching * farmer carry with single rainbow hand floats, walking forward and backward  * Holding wall: heel raises x 10; toe raises x 10; hip abdct/addct x 10; LE swings into hip flex/ext x 10 * hollow noodle pull downs to thighs x 10 in standard stance * seated on bench with feet on blue step: STS with forward arm reach x 10 * bilat -> single leg gastroc stretch x 15s  * Single leg forward lunge to/from hamstring stretch x4 each LE  Pt requires the buoyancy and hydrostatic pressure of water for support, and to offload joints by unweighting joint load by at least 50 % in navel deep water and by at least 75-80% in chest to neck deep water.  Viscosity of the water is needed for resistance of strengthening. Water current perturbations provides challenge to standing balance requiring increased core activation.  PATIENT EDUCATION:  Education details: aquatic exercise progressions/modifications  Person educated: Patient Education method: Explanation Education comprehension: verbalized understanding  HOME EXERCISE  PROGRAM: TBA  ASSESSMENT:  CLINICAL IMPRESSION:  Pt tolerated session well, without loss of balance or increase in pain. Much improved confidence; able to move across pool with/without UE support on floatation, with therapist instructing from the deck. Lt knee ROM unchanged, but TUG improved.  Pt is progressing towards goals.      EVAL: Patient is a 67 y.o. f who was seen today for physical therapy evaluation and treatment for OA knee L.  She has had a R TKR x 10 yrs ago and a recent decompression and TLIF/PLF lumbar spine. She reports improved amb toleration and ability since LB surgery.  She presents today with le and core weakness. Functional testing positive for balance dysfunction/high fall risk and poor mobility. Pain left knee with crepitus. She has muscle weakness throughout her LE and core and gait deviation avoiding TKE left offsetting towards right.  She will benefit from skilled physical therapy intervention initially in aquatic setting for core and le strengthening, gait training/toleration, stair climbing then progress to land based for loaded strengthening, body mechanic instruction, gait training and HEP.  OBJECTIVE IMPAIRMENTS: Abnormal gait, decreased activity tolerance, decreased balance, decreased endurance, decreased mobility, difficulty walking, decreased ROM, decreased strength, improper body mechanics, obesity, and pain.   ACTIVITY LIMITATIONS: carrying, lifting, bending, standing, squatting, sleeping, stairs, transfers, and locomotion level  PARTICIPATION LIMITATIONS: meal prep, cleaning, laundry, shopping, community activity, and yard work  PERSONAL FACTORS: Age, Fitness, and 1-2 comorbidities: DM and morbid obesity  are also affecting patient's functional outcome.   REHAB POTENTIAL: Good  CLINICAL DECISION MAKING: Evolving/moderate complexity  EVALUATION COMPLEXITY: Moderate   GOALS: Goals reviewed with patient? Yes  SHORT TERM GOALS: Target date: 09/18/22 Pt  will tolerate full aquatic sessions consistently without increase in pain and with improving function to demonstrate good toleration and effectiveness of intervention.  Baseline: Goal status: INITIAL  2.  Pt will improve on Tug test to <or=  17 to demonstrate improvement in lower extremity function, mobility and decreased fall  risk. Baseline: 21.58 Goal status: INITIAL  3.  Pt will improve with transfers in and out of car with increase in pain Baseline:  Goal status: INITIAL  4.  L knee flex to 100d and extension towards 0 for improved gait pattern Baseline: see chart Goal status: INITIAL  5.  Pt will report walking through grocery store pushing cart for up towards 25 minutes (personal goal) Baseline: electric cart Goal status: INITIAL    LONG TERM GOALS: Target date: 10/16/22  Pt to meet stated Foto Goal 61% Baseline: risk adjusted 42% Goal status: INITIAL  2.  Pt will be indep with final HEP's (land and aquatic as appropriate) for continued management of condition Baseline:  Goal status: INITIAL  3.  Pt will improve strength in all areas listed by at least 10 lbs to demonstrate improved overall physical function  Baseline:  Goal status: INITIAL  4.  Pt will improve on Berg balance test to >/=45/56 to demonstrate a decrease in fall risk. Baseline: 28/56 Goal status: INITIAL  5.  Pt will report toleration to vacuuming and other household chores Baseline: avoids Goal status: INITIAL  6.  Pt will have a reduction in her worst pain to 4/10 or less to demonstrate improvement management of condition Baseline:  Goal status: INITIAL   PLAN:  PT FREQUENCY: 1-2x/week  PT DURATION: 8 weeks  PLANNED INTERVENTIONS: Therapeutic exercises, Therapeutic activity, Neuromuscular re-education, Balance training, Gait training, Patient/Family education, Self Care, Joint mobilization, Stair training, Orthotic/Fit training, DME instructions, Aquatic Therapy, Dry Needling, Electrical  stimulation, Moist heat, Taping, Ionotophoresis 4mg /ml Dexamethasone, Manual therapy, and Re-evaluation  PLAN FOR NEXT SESSION: aquatic setting for core and le strengthening, gait training/toleration, stair climbing  land based for loaded strengthening, Optometrist instruction, gait training and HEP  Mayer Camel, PTA 09/25/22 3:02 PM [facility] [address] [place], [place], [address] Phone: [phone]   Fax:  [phone]

## 2022-09-27 ENCOUNTER — Encounter (HOSPITAL_BASED_OUTPATIENT_CLINIC_OR_DEPARTMENT_OTHER): Payer: Self-pay | Admitting: Physical Therapy

## 2022-09-27 ENCOUNTER — Ambulatory Visit (HOSPITAL_BASED_OUTPATIENT_CLINIC_OR_DEPARTMENT_OTHER): Payer: PPO | Admitting: Physical Therapy

## 2022-09-27 DIAGNOSIS — M6281 Muscle weakness (generalized): Secondary | ICD-10-CM

## 2022-09-27 DIAGNOSIS — G8929 Other chronic pain: Secondary | ICD-10-CM

## 2022-09-27 DIAGNOSIS — M25561 Pain in right knee: Secondary | ICD-10-CM | POA: Diagnosis not present

## 2022-09-27 DIAGNOSIS — R2689 Other abnormalities of gait and mobility: Secondary | ICD-10-CM

## 2022-09-27 NOTE — Therapy (Signed)
OUTPATIENT PHYSICAL THERAPY LOWER EXTREMITY TREATMENT   Patient Name: Kelly Farley MRN: [id number] DOB:02/06/1955, 67 y.o., female Today's Date: 09/27/2022  END OF SESSION:  PT End of Session - 09/27/22 1549     Visit Number 5    Number of Visits 16    Date for PT Re-Evaluation 10/16/22    PT Start Time 1446    PT Stop Time 1530    PT Time Calculation (min) 44 min    Activity Tolerance Patient tolerated treatment well    Behavior During Therapy [facility] for tasks assessed/performed              Past Medical History:  Diagnosis Date   Arthritis    Diabetes mellitus without complication (HCC)    Hyperlipemia    Hypertension    Other abnormal findings in urine 07/05/2021   Pain in right knee 02/14/2012   Palpitations    wore holter monitor 30 years ago; murmur no longer an issue per patient   Sleep apnea    Sweats, menopausal 10/20/2015   Past Surgical History:  Procedure Laterality Date   ABDOMINAL HYSTERECTOMY     total   BREAST BIOPSY Left 04/03/2022   stereo bx, calcs, RIBBON clip-path pending   BREAST BIOPSY Left 04/03/2022   MM LT BREAST BX W LOC DEV 1ST LESION IMAGE BX SPEC STEREO GUIDE 04/03/2022 [facility]   COLONOSCOPY WITH PROPOFOL N/A 08/18/2020   Procedure: COLONOSCOPY WITH PROPOFOL;  Surgeon: Pasty Spillers, MD;  Location: [facility] ENDOSCOPY;  Service: Endoscopy;  Laterality: N/A;   EYE SURGERY     JOINT REPLACEMENT  01/09/2012   right knee   TRANSFORAMINAL LUMBAR INTERBODY FUSION (TLIF) WITH PEDICLE SCREW FIXATION 2 LEVEL N/A 12/19/2021   Procedure: Lumbar Four- Five Lumbar Five-Sacral One Open laminectomy/Transforaminal Lumbar Interbody Fusion/Posterolateral instrumented fusion;  Surgeon: Jadene Pierini, MD;  Location: [facility] OR;  Service: Neurosurgery;  Laterality: N/A;  RM 21/3C/TO FOLLOW   Patient Active Problem List   Diagnosis Date Noted   Morbid obesity with body mass index (BMI) of 40.0 or higher (HCC) 06/19/2022   Esophageal reflux  04/10/2022   Fusion of spine, lumbar region 04/10/2022   Spondylolisthesis of lumbar region 12/19/2021   Simple renal cyst 08/22/2021   Other obesity 07/05/2021   Stage 3b chronic kidney disease (HCC) 07/05/2021   Cecal polyp    Polyp of sigmoid colon    Chronic kidney disease (CKD) 07/16/2019   Mixed hyperlipidemia 07/16/2018   Bronchitis 01/20/2016   Idiopathic gout, left ankle and foot 01/20/2016   Menopausal and female climacteric states 10/20/2015   Obstructive sleep apnea 07/16/2014   Type 2 diabetes mellitus without complications (HCC) 11/17/2013   Edema 04/23/2011   Unspecified osteoarthritis, unspecified site 07/16/2002   Hypertension 07/16/1998    PCP: Sherron Monday, MD   REFERRING PROVIDER: Andrena Mews, DO   REFERRING DIAG:  Bilateral primary osteoarthritis of knee  M25.561 (ICD-10-CM) - Pain in right knee  R26.2 (ICD-10-CM) - Difficulty in walking, not elsewhere classified    THERAPY DIAG:  Chronic pain of right knee  Muscle weakness (generalized)  Other abnormalities of gait and mobility  Rationale for Evaluation and Treatment: Rehabilitation  ONSET DATE: exacerbated last year  SUBJECTIVE:   SUBJECTIVE STATEMENT: Pain in left heel seems to be getting worse.  Really bad first thing in morning then improves with walking.  Also bad w    EVAL:[place] 10 years [place] Had bad experience with right knee.  Trying to avoid L TKR. I am retired.  Cared for parents.  I am limited with walking a lot.  I used my cane down here use it as a precaution.  LB surgery in dec.  Sciatic pain in rle is gone. Right foot still has some numbness.  I think I could have walked another 100 ft before needing to sit. Use electric scooters in grocery stores although I would like to be able to walk through them.  I am walking a lot better since back surgery.  Walked in sand on beach this summer and it really hurt, increased pain left knee.  Right knee no pain just stiffness    PERTINENT HISTORY: Lumbar spondylolisthesis and stenosis : 2 level decompression and TLIF/PLF 12/21/21 PAIN:  Are you having pain?no : NPRS scale: current 0/10- at rest  Pain location:  Pain description:  Aggravating factors: walking, getting and out of car Relieving factors: sitting,lyingdown, tylenol  PRECAUTIONS: Fall  RED FLAGS: None   WEIGHT BEARING RESTRICTIONS: No  FALLS:  Has patient fallen in last 6 months? No  LIVING ENVIRONMENT: Lives with: lives alone Lives in: House/apartment Stairs: Yes: External: 4-5 steps; on left going up Has following equipment at home: Single point cane  OCCUPATION: retired  PLOF: Independent  PATIENT GOALS: Walk through grocery store, going to foot ball game, vacuuming/house chores    OBJECTIVE:    PATIENT SURVEYS:  FOTO risk adjusted 42% with goal of 61%  COGNITION: Overall cognitive status: Within functional limits for tasks assessed     SENSATION: Around right toes and heel residual numbness due to LB dyfunction.  Has improved since surgery    MUSCLE LENGTH: Hamstrings: Full knee extension   POSTURE: weight shift left and ue and shoulder guarding  PALPATION: L knee crepitus  LOWER EXTREMITY ROM:  Active ROM Right eval Left eval Left  9/17  Hip flexion     Hip extension     Hip abduction     Hip adduction     Hip internal rotation     Hip external rotation     Knee flexion 112 92 92  Knee extension  -13 -17  Ankle dorsiflexion     Ankle plantarflexion     Ankle inversion     Ankle eversion      (Blank rows = not tested)  LOWER EXTREMITY MMT:  MMT Right eval Left eval Lt    Hip flexion 30.4 24.2   Hip extension     Hip abduction 26.2 22.8   Hip adduction     Hip internal rotation     Hip external rotation     Knee flexion     Knee extension 33.0 22.8   Ankle dorsiflexion     Ankle plantarflexion     Ankle inversion     Ankle eversion      (Blank rows = not tested)   FUNCTIONAL  TESTS:  Timed up and go (TUG): 21.58 Berg Balance Scale: 28/56 09/25/22: TUG : 18.96s without cane   GAIT: Distance walked: 400 ft Assistive device utilized: Single point cane Level of assistance: Modified independence Comments: Shifted right, no left heel strike/ TKE left, antalgic limp   TODAY'S TREATMENT:  Pt seen for aquatic therapy today.  Treatment took place in water 3.5-4.75 ft in depth at the [facility] pool. Temp of water was 91.  Pt entered/exited the pool via stairs in step-to pattern with bilat rail. Pt feels better facing hot tub.   * holding noodle:  walking forward/ backward,  * wide stance with arm addct with rainbow hand floats x 5, then side stepping with arm addct with rainbow hand floats * row motion with rainbow hand floats with marching * farmer carry with single then bilat rainbow hand floats, walking forward and backward  * ue support rainbow hand buoy: heel raises x 12; toe raises x 12; hip abdct/addct x 10; ue support on wall LE swings into hip flex/ext x 10 * solid noodle pull downs to thighs x 10 in standard stance  Pt requires the buoyancy and hydrostatic pressure of water for support, and to offload joints by unweighting joint load by at least 50 % in navel deep water and by at least 75-80% in chest to neck deep water.  Viscosity of the water is needed for resistance of strengthening. Water current perturbations provides challenge to standing balance requiring increased core activation.  Pt instruction and demonstration on car transfers.    PATIENT EDUCATION:  Education details: aquatic exercise progressions/modifications  Person educated: Patient Education method: Explanation Education comprehension: verbalized understanding  HOME EXERCISE PROGRAM: TBA  ASSESSMENT:  CLINICAL IMPRESSION:  Pt requires ue support of  wall to complete hip flex/ext.  Was able to tolerate progression of UE support only with remaining standing exercises demonstrating some improvement in balance.  Improved core strength also with good toleration to increased foam resistance with TrA sets.  Pt completed session today submerged without pool shoes as heel pain seems to have appeared coincidently with begin aquatic therapy.  Pain does appear to be plantar fascia related due to reported symptoms.  She is advised to ice 3 x daily. Good progress today.      EVAL: Patient is a 67 y.o. f who was seen today for physical therapy evaluation and treatment for OA knee L.  She has had a R TKR x 10 yrs ago and a recent decompression and TLIF/PLF lumbar spine. She reports improved amb toleration and ability since LB surgery.  She presents today with le and core weakness. Functional testing positive for balance dysfunction/high fall risk and poor mobility. Pain left knee with crepitus. She has muscle weakness throughout her LE and core and gait deviation avoiding TKE left offsetting towards right.  She will benefit from skilled physical therapy intervention initially in aquatic setting for core and le strengthening, gait training/toleration, stair climbing then progress to land based for loaded strengthening, body mechanic instruction, gait training and HEP.  OBJECTIVE IMPAIRMENTS: Abnormal gait, decreased activity tolerance, decreased balance, decreased endurance, decreased mobility, difficulty walking, decreased ROM, decreased strength, improper body mechanics, obesity, and pain.   ACTIVITY LIMITATIONS: carrying, lifting, bending, standing, squatting, sleeping, stairs, transfers, and locomotion level  PARTICIPATION LIMITATIONS: meal prep, cleaning, laundry, shopping, community activity, and yard work  PERSONAL FACTORS: Age, Fitness, and 1-2 comorbidities: DM and morbid obesity  are also affecting patient's functional outcome.   REHAB POTENTIAL:  Good  CLINICAL DECISION MAKING: Evolving/moderate complexity  EVALUATION COMPLEXITY: Moderate   GOALS: Goals reviewed with patient? Yes  SHORT TERM GOALS: Target date: 09/18/22 Pt will tolerate full aquatic sessions consistently without increase in pain and with improving function to demonstrate good toleration and effectiveness of intervention.  Baseline: Goal status: Met  09/27/22  2.  Pt will improve on Tug test to <or=  17 to demonstrate improvement in lower extremity function, mobility and decreased fall risk. Baseline: 21.58 Goal status: INITIAL  3.  Pt will improve with transfers in and out of car with increase in pain Baseline:  Goal status: In Progress 09/27/22  4.  L knee flex to 100d and extension towards 0 for improved gait pattern Baseline: see chart Goal status: INITIAL  5.  Pt will report walking through grocery store pushing cart for up towards 25 minutes (personal goal) Baseline: electric cart Goal status: In Progress.  Pt reports she has not tried to walk through store.    LONG TERM GOALS: Target date: 10/16/22  Pt to meet stated Foto Goal 61% Baseline: risk adjusted 42% Goal status: INITIAL  2.  Pt will be indep with final HEP's (land and aquatic as appropriate) for continued management of condition Baseline:  Goal status: INITIAL  3.  Pt will improve strength in all areas listed by at least 10 lbs to demonstrate improved overall physical function  Baseline:  Goal status: INITIAL  4.  Pt will improve on Berg balance test to >/=45/56 to demonstrate a decrease in fall risk. Baseline: 28/56 Goal status: INITIAL  5.  Pt will report toleration to vacuuming and other household chores Baseline: avoids Goal status: INITIAL  6.  Pt will have a reduction in her worst pain to 4/10 or less to demonstrate improvement management of condition Baseline:  Goal status: INITIAL   PLAN:  PT FREQUENCY: 1-2x/week  PT DURATION: 8 weeks  PLANNED INTERVENTIONS:  Therapeutic exercises, Therapeutic activity, Neuromuscular re-education, Balance training, Gait training, Patient/Family education, Self Care, Joint mobilization, Stair training, Orthotic/Fit training, DME instructions, Aquatic Therapy, Dry Needling, Electrical stimulation, Moist heat, Taping, Ionotophoresis 4mg /ml Dexamethasone, Manual therapy, and Re-evaluation  PLAN FOR NEXT SESSION: aquatic setting for core and le strengthening, gait training/toleration, stair climbing  land based for loaded strengthening, Optometrist instruction, gait training and HEP  Rushie Chestnut) Ziana Heyliger MPT 09/27/22 3:51 PM [facility] [address] [place], [place], [address] Phone: [phone]   Fax:  [phone]

## 2022-09-28 IMAGING — MG DIGITAL DIAGNOSTIC BILAT W/ TOMO W/ CAD
8 of 19 series · 8 of 40 positions shown · non-contrast
Comparison: Previous exam(s).

CLINICAL DATA: LEFT breast pain.



[L MLO synth-2D (1 of 2)]
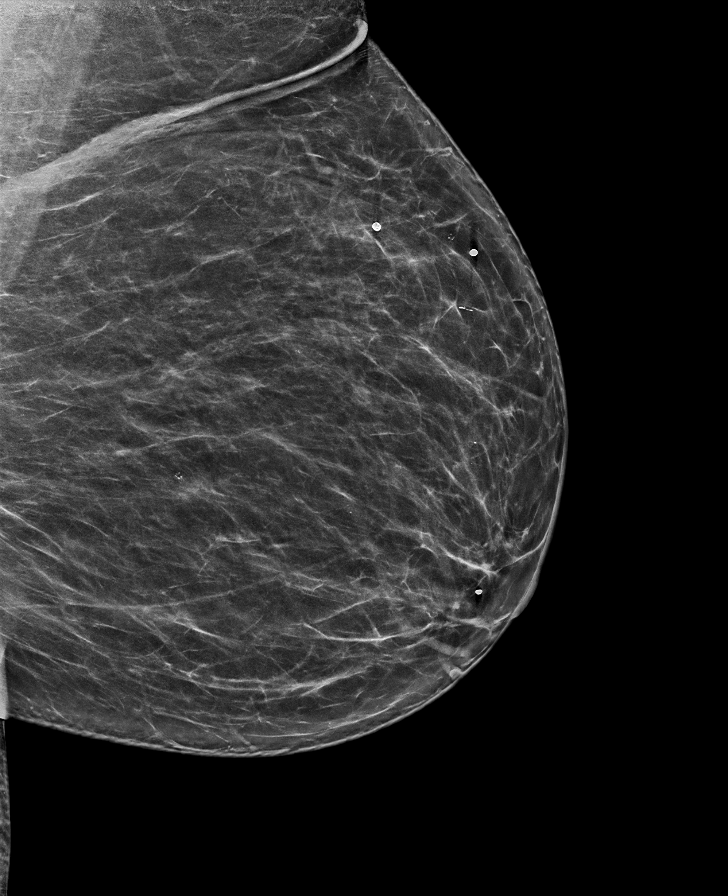

[R MLO synth-2D (1 of 2)]
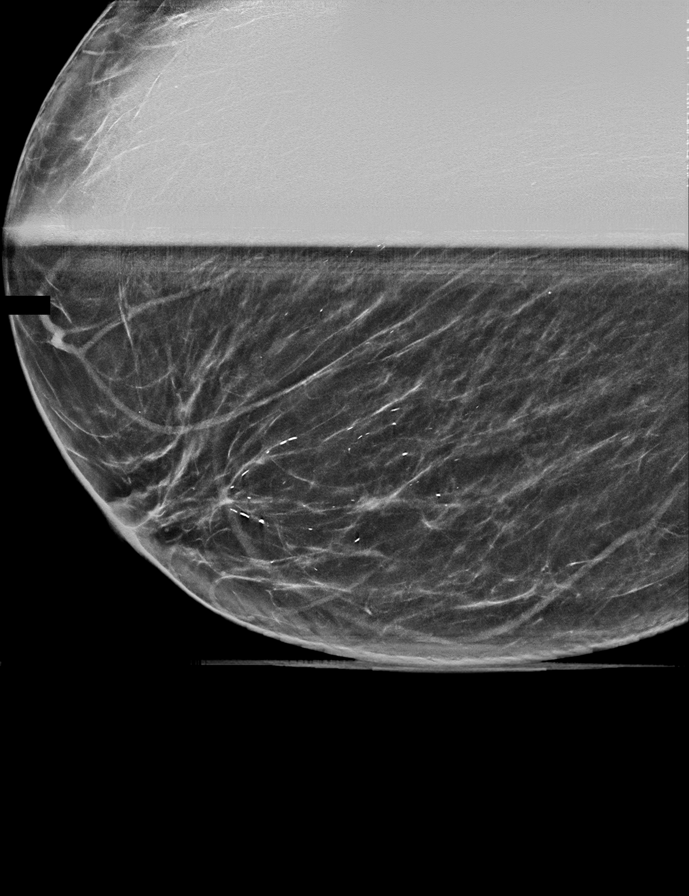

[R CC synth-2D]
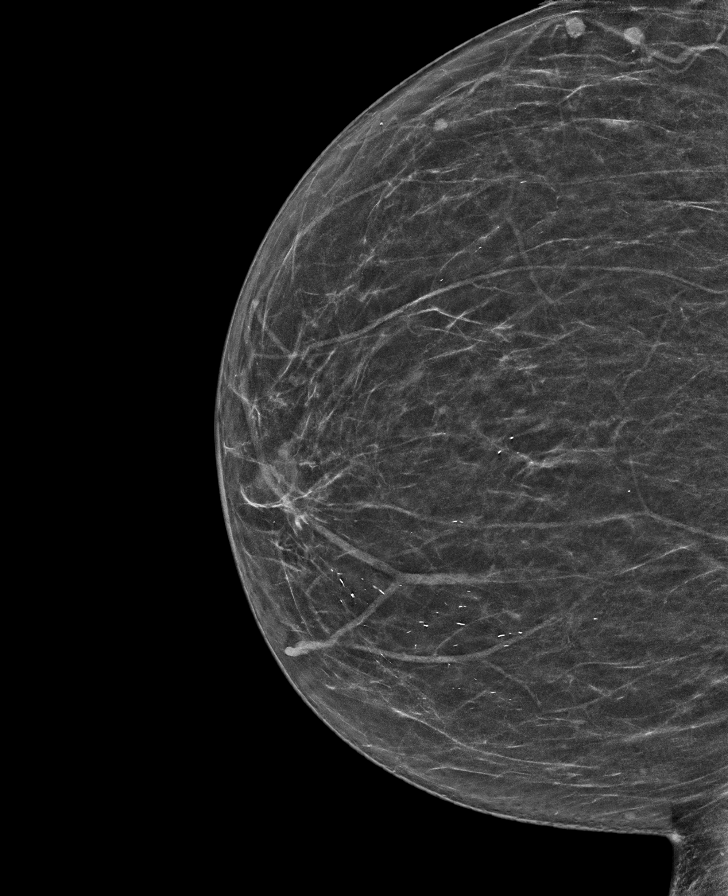

[L MLO synth-2D (2 of 2)]
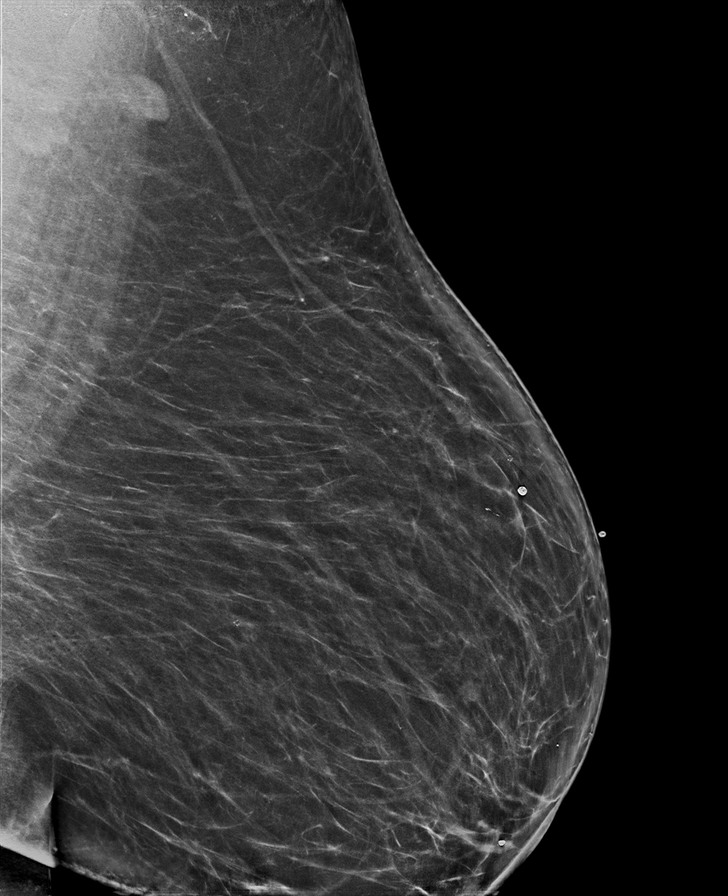

[R MLO synth-2D (2 of 2)]
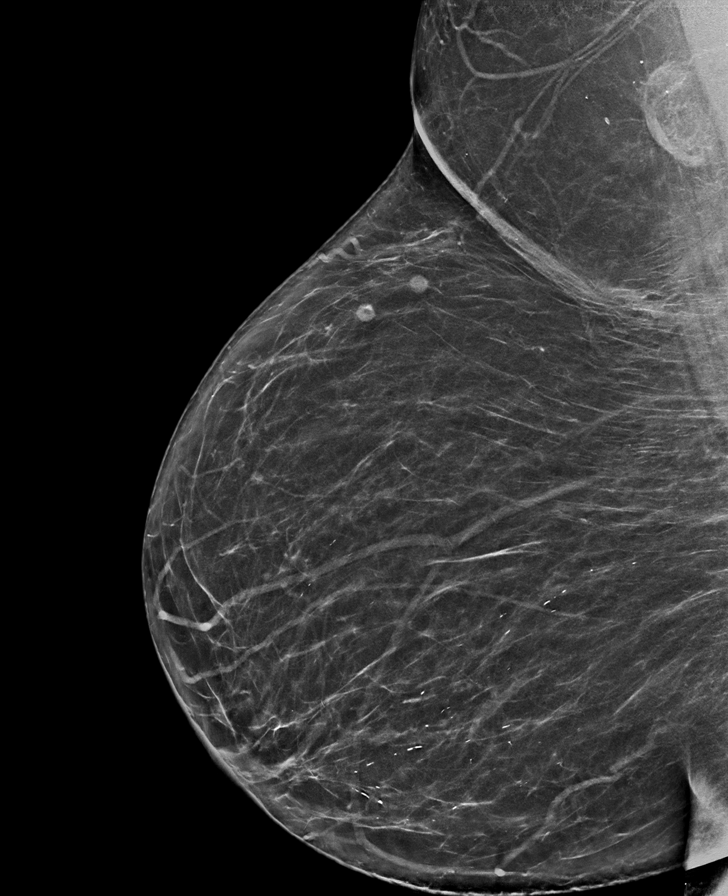

[L CC synth-2D (1 of 2)]
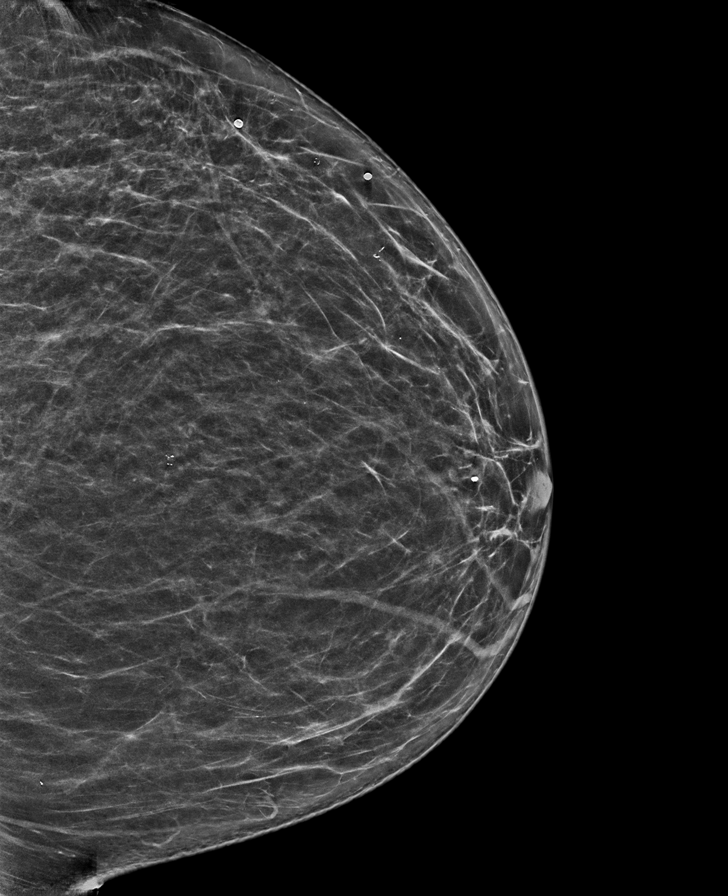

[L CC synth-2D (2 of 2)]
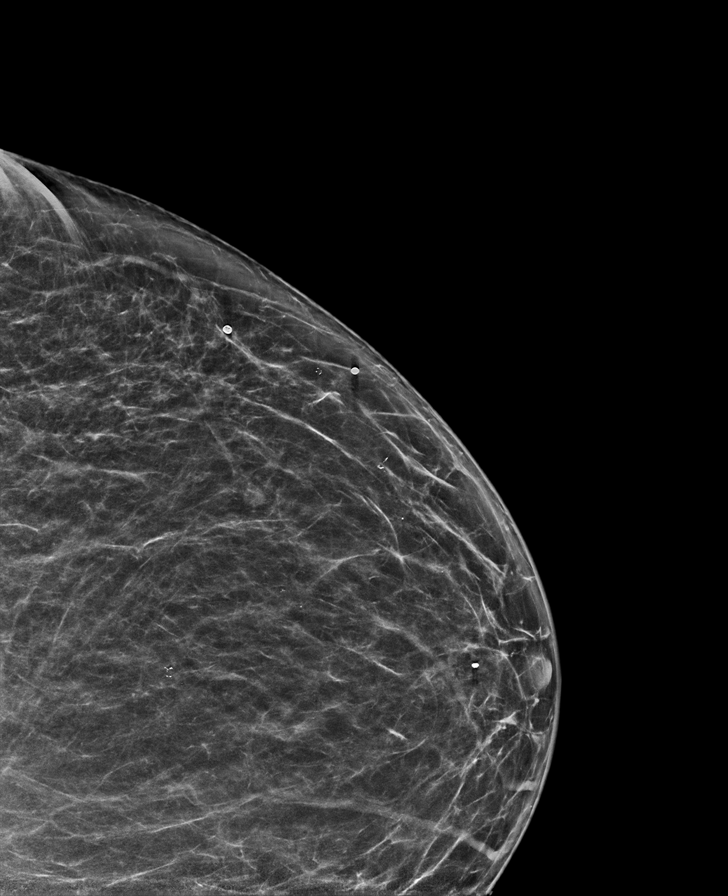

[R CC tomo · tomo slice 43/63.0]
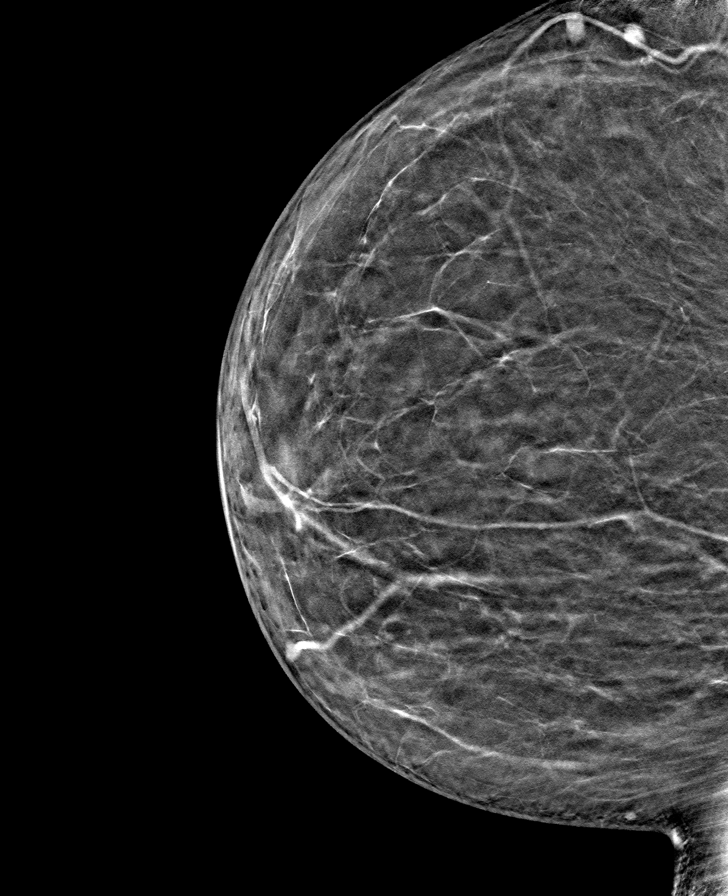

[8 of 40 positions shown; findings below may reference images not displayed]

ACR Breast Density Category b: There are scattered areas of
fibroglandular density.
FINDINGS: Spot compression tomosynthesis views were obtained over the area of
focal pain in the LEFT breast. No suspicious mammographic finding is
identified in this area. No suspicious mass, microcalcification, or
other finding is identified in the LEFT breast.

In the RIGHT lower breast, there is a predominately tubular
asymmetry. This asymmetry is not significantly changed since
February 2013. Questioned distortion effaces with additional
mammographic views. No additional suspicious findings within the
RIGHT breast.

On physical exam, no suspicious mass is appreciated.

Targeted LEFT breast ultrasound was performed in the area of pain at
the outer breast. No suspicious solid or cystic mass is identified.
No findings to explain the patient's symptoms.

Targeted RIGHT breast ultrasound was performed of the lower RIGHT
breast. At the site of the tubular asymmetry, there are multiple
intertwined anechoic focally ectatic ducts with several scattered
cysts. No suspicious cystic or solid mass is seen. This is
sonographically similar in appearance dating back to February 27, 2013
IMPRESSION: 1. No mammographic or sonographic evidence of malignancy at the site
of painful concern in the LEFT breast. Any further workup of the
patient's symptoms should be based on the clinical assessment.
Recommend routine annual screening mammogram in 1 year.
2. Similar appearance of RIGHT breast duct ectasia since 7936. No
mammographic evidence of malignancy in the RIGHT breast.

RECOMMENDATION:
Screening mammogram in one year.(Code:[id number])

I have discussed the findings and recommendations with the patient.
If applicable, a reminder letter will be sent to the patient
regarding the next appointment.

BI-RADS CATEGORY  2: Benign.

## 2022-10-02 ENCOUNTER — Ambulatory Visit (HOSPITAL_BASED_OUTPATIENT_CLINIC_OR_DEPARTMENT_OTHER): Payer: PPO | Admitting: Physical Therapy

## 2022-10-02 ENCOUNTER — Encounter (HOSPITAL_BASED_OUTPATIENT_CLINIC_OR_DEPARTMENT_OTHER): Payer: Self-pay | Admitting: Physical Therapy

## 2022-10-02 DIAGNOSIS — R2689 Other abnormalities of gait and mobility: Secondary | ICD-10-CM

## 2022-10-02 DIAGNOSIS — G8929 Other chronic pain: Secondary | ICD-10-CM

## 2022-10-02 DIAGNOSIS — M25561 Pain in right knee: Secondary | ICD-10-CM | POA: Diagnosis not present

## 2022-10-02 DIAGNOSIS — M6281 Muscle weakness (generalized): Secondary | ICD-10-CM

## 2022-10-02 NOTE — Therapy (Signed)
OUTPATIENT PHYSICAL THERAPY LOWER EXTREMITY TREATMENT   Patient Name: Kelly Farley MRN: [id number] DOB:1955/12/20, 67 y.o., female Today's Date: 10/02/2022  END OF SESSION:  PT End of Session - 10/02/22 1406     Visit Number 6    Number of Visits 16    Date for PT Re-Evaluation 10/16/22    PT Start Time 1400    PT Stop Time 1440    PT Time Calculation (min) 40 min    Activity Tolerance Patient tolerated treatment well    Behavior During Therapy [facility] for tasks assessed/performed              Past Medical History:  Diagnosis Date   Arthritis    Diabetes mellitus without complication (HCC)    Hyperlipemia    Hypertension    Other abnormal findings in urine 07/05/2021   Pain in right knee 02/14/2012   Palpitations    wore holter monitor 30 years ago; murmur no longer an issue per patient   Sleep apnea    Sweats, menopausal 10/20/2015   Past Surgical History:  Procedure Laterality Date   ABDOMINAL HYSTERECTOMY     total   BREAST BIOPSY Left 04/03/2022   stereo bx, calcs, RIBBON clip-path pending   BREAST BIOPSY Left 04/03/2022   MM LT BREAST BX W LOC DEV 1ST LESION IMAGE BX SPEC STEREO GUIDE 04/03/2022 [facility]   COLONOSCOPY WITH PROPOFOL N/A 08/18/2020   Procedure: COLONOSCOPY WITH PROPOFOL;  Surgeon: Pasty Spillers, MD;  Location: [facility];  Service: Endoscopy;  Laterality: N/A;   EYE SURGERY     JOINT REPLACEMENT  01/09/2012   right knee   TRANSFORAMINAL LUMBAR INTERBODY FUSION (TLIF) WITH PEDICLE SCREW FIXATION 2 LEVEL N/A 12/19/2021   Procedure: Lumbar Four- Five Lumbar Five-Sacral One Open laminectomy/Transforaminal Lumbar Interbody Fusion/Posterolateral instrumented fusion;  Surgeon: Jadene Pierini, MD;  Location: [facility] OR;  Service: Neurosurgery;  Laterality: N/A;  RM 21/3C/TO FOLLOW   Patient Active Problem List   Diagnosis Date Noted   Morbid obesity with body mass index (BMI) of 40.0 or higher (HCC) 06/19/2022   Esophageal reflux  04/10/2022   Fusion of spine, lumbar region 04/10/2022   Spondylolisthesis of lumbar region 12/19/2021   Simple renal cyst 08/22/2021   Other obesity 07/05/2021   Stage 3b chronic kidney disease (HCC) 07/05/2021   Cecal polyp    Polyp of sigmoid colon    Chronic kidney disease (CKD) 07/16/2019   Mixed hyperlipidemia 07/16/2018   Bronchitis 01/20/2016   Idiopathic gout, left ankle and foot 01/20/2016   Menopausal and female climacteric states 10/20/2015   Obstructive sleep apnea 07/16/2014   Type 2 diabetes mellitus without complications (HCC) 11/17/2013   Edema 04/23/2011   Unspecified osteoarthritis, unspecified site 07/16/2002   Hypertension 07/16/1998    PCP: Sherron Monday, MD   REFERRING PROVIDER: Andrena Mews, DO   REFERRING DIAG:  Bilateral primary osteoarthritis of knee  M25.561 (ICD-10-CM) - Pain in right knee  R26.2 (ICD-10-CM) - Difficulty in walking, not elsewhere classified    THERAPY DIAG:  Chronic pain of right knee  Muscle weakness (generalized)  Other abnormalities of gait and mobility  Chronic pain of left knee  Rationale for Evaluation and Treatment: Rehabilitation  ONSET DATE: exacerbated last year  SUBJECTIVE:   SUBJECTIVE STATEMENT: Pt reports that she has been icing her heel 2x/day and it has helped.  She wore sneakers in today and felt "pretty good" walking in.  She reports some soreness after last session,  but took pain medicine and it resolved.    EVAL:[place] 10 years in [place] Had bad experience with right knee.  Trying to avoid L TKR. I am retired.  Cared for parents.  I am limited with walking a lot.  I used my cane down here use it as a precaution.  LB surgery in dec.  Sciatic pain in rle is gone. Right foot still has some numbness.  I think I could have walked another 100 ft before needing to sit. Use electric scooters in grocery stores although I would like to be able to walk through them.  I am walking a lot better since back  surgery.  Walked in sand on beach this summer and it really hurt, increased pain left knee.  Right knee no pain just stiffness   PERTINENT HISTORY: Lumbar spondylolisthesis and stenosis : 2 level decompression and TLIF/PLF 12/21/21 PAIN:  Are you having pain?no : NPRS scale: current 0/10- at rest  Pain location:  Pain description:  Aggravating factors: walking, getting and out of car Relieving factors: sitting,lyingdown, tylenol  PRECAUTIONS: Fall  RED FLAGS: None   WEIGHT BEARING RESTRICTIONS: No  FALLS:  Has patient fallen in last 6 months? No  LIVING ENVIRONMENT: Lives with: lives alone Lives in: House/apartment Stairs: Yes: External: 4-5 steps; on left going up Has following equipment at home: Single point cane  OCCUPATION: retired  PLOF: Independent  PATIENT GOALS: Walk through grocery store, going to foot ball game, vacuuming/house chores    OBJECTIVE:    PATIENT SURVEYS:  FOTO risk adjusted 42% with goal of 61%  COGNITION: Overall cognitive status: Within functional limits for tasks assessed     SENSATION: Around right toes and heel residual numbness due to LB dyfunction.  Has improved since surgery    MUSCLE LENGTH: Hamstrings: Full knee extension   POSTURE: weight shift left and ue and shoulder guarding  PALPATION: L knee crepitus  LOWER EXTREMITY ROM:  Active ROM Right eval Left eval Left  9/17  Hip flexion     Hip extension     Hip abduction     Hip adduction     Hip internal rotation     Hip external rotation     Knee flexion 112 92 92  Knee extension  -13 -17  Ankle dorsiflexion     Ankle plantarflexion     Ankle inversion     Ankle eversion      (Blank rows = not tested)  LOWER EXTREMITY MMT:  MMT Right eval Left eval Lt    Hip flexion 30.4 24.2   Hip extension     Hip abduction 26.2 22.8   Hip adduction     Hip internal rotation     Hip external rotation     Knee flexion     Knee extension 33.0 22.8   Ankle  dorsiflexion     Ankle plantarflexion     Ankle inversion     Ankle eversion      (Blank rows = not tested)   FUNCTIONAL TESTS:  Timed up and go (TUG): 21.58 Berg Balance Scale: 28/56 09/25/22: TUG : 18.96s without cane   GAIT: Distance walked: 400 ft Assistive device utilized: Single point cane Level of assistance: Modified independence Comments: Shifted right, no left heel strike/ TKE left, antalgic limp   TODAY'S TREATMENT:  Pt seen for aquatic therapy today.  Treatment took place in water 3.5-4.75 ft in depth at the [facility] pool. Temp of water was 91.  Pt entered/exited the pool via stairs in step-to pattern with bilat rail.   * holding noodle:  walking forward/ backward- multiple lengths, side stepping R/L cues for upright trunk * side stepping with arm addct with rainbow hand floats * UE support rainbow hand floats: heel raises x 12; toe raises x 12; hip abdct/addct x 10;  * row motion with rainbow hand floats with marching;  semi-tandem stance (challenge);  tandem gait forward/ backward  * UE support on wall LE swings into hip flex/ext x 10 * solid noodle pull downs to thighs x 10 in standard stance * return to marching with row motion * forward lunge with foot on 2nd step followed by hamstring stretch with overpressure by pt x 4 each LE  Pt requires the buoyancy and hydrostatic pressure of water for support, and to offload joints by unweighting joint load by at least 50 % in navel deep water and by at least 75-80% in chest to neck deep water.  Viscosity of the water is needed for resistance of strengthening. Water current perturbations provides challenge to standing balance requiring increased core activation.  Pt instruction and demonstration on car transfers.    PATIENT EDUCATION:  Education details: aquatic exercise  progressions/modifications  Person educated: Patient Education method: Explanation Education comprehension: verbalized understanding  HOME EXERCISE PROGRAM: TBA  ASSESSMENT:  CLINICAL IMPRESSION: Reduction of heel pain since icing 2x/day.  Pt is challenged with narrow base of support and SLS exercises.  She requires frequent cues for more upright trunk vs slightly flexed forward.  She tolerated all exercises well with only minor increase in Lt knee pain during session (during ROM exercises).  Progressing well towards established goals.  Measure TUG and ROM next visit prior to entry in water.       EVAL: Patient is a 67 y.o. f who was seen today for physical therapy evaluation and treatment for OA knee L.  She has had a R TKR x 10 yrs ago and a recent decompression and TLIF/PLF lumbar spine. She reports improved amb toleration and ability since LB surgery.  She presents today with le and core weakness. Functional testing positive for balance dysfunction/high fall risk and poor mobility. Pain left knee with crepitus. She has muscle weakness throughout her LE and core and gait deviation avoiding TKE left offsetting towards right.  She will benefit from skilled physical therapy intervention initially in aquatic setting for core and le strengthening, gait training/toleration, stair climbing then progress to land based for loaded strengthening, body mechanic instruction, gait training and HEP.  OBJECTIVE IMPAIRMENTS: Abnormal gait, decreased activity tolerance, decreased balance, decreased endurance, decreased mobility, difficulty walking, decreased ROM, decreased strength, improper body mechanics, obesity, and pain.   ACTIVITY LIMITATIONS: carrying, lifting, bending, standing, squatting, sleeping, stairs, transfers, and locomotion level  PARTICIPATION LIMITATIONS: meal prep, cleaning, laundry, shopping, community activity, and yard work  PERSONAL FACTORS: Age, Fitness, and 1-2 comorbidities: DM  and morbid obesity  are also affecting patient's functional outcome.   REHAB POTENTIAL: Good  CLINICAL DECISION MAKING: Evolving/moderate complexity  EVALUATION COMPLEXITY: Moderate   GOALS: Goals reviewed with patient? Yes  SHORT TERM GOALS: Target date: 09/18/22 Pt will tolerate full aquatic sessions consistently without increase in pain and with improving function to demonstrate good toleration and effectiveness of intervention.  Baseline: Goal status: Met  09/27/22  2.  Pt will improve on Tug test to <or=  17 to demonstrate improvement in lower extremity function, mobility and decreased fall risk. Baseline: 21.58 Goal status: INITIAL  3.  Pt will improve with transfers in and out of car with increase in pain Baseline:  Goal status: In Progress 09/27/22  4.  L knee flex to 100d and extension towards 0 for improved gait pattern Baseline: see chart Goal status: INITIAL  5.  Pt will report walking through grocery store pushing cart for up towards 25 minutes (personal goal) Baseline: electric cart Goal status: In Progress.  Pt reports she has not tried to walk through store.    LONG TERM GOALS: Target date: 10/16/22  Pt to meet stated Foto Goal 61% Baseline: risk adjusted 42% Goal status: INITIAL  2.  Pt will be indep with final HEP's (land and aquatic as appropriate) for continued management of condition Baseline:  Goal status: INITIAL  3.  Pt will improve strength in all areas listed by at least 10 lbs to demonstrate improved overall physical function  Baseline:  Goal status: INITIAL  4.  Pt will improve on Berg balance test to >/=45/56 to demonstrate a decrease in fall risk. Baseline: 28/56 Goal status: INITIAL  5.  Pt will report toleration to vacuuming and other household chores Baseline: avoids Goal status: INITIAL  6.  Pt will have a reduction in her worst pain to 4/10 or less to demonstrate improvement management of condition Baseline:  Goal status:  INITIAL   PLAN:  PT FREQUENCY: 1-2x/week  PT DURATION: 8 weeks  PLANNED INTERVENTIONS: Therapeutic exercises, Therapeutic activity, Neuromuscular re-education, Balance training, Gait training, Patient/Family education, Self Care, Joint mobilization, Stair training, Orthotic/Fit training, DME instructions, Aquatic Therapy, Dry Needling, Electrical stimulation, Moist heat, Taping, Ionotophoresis 4mg /ml Dexamethasone, Manual therapy, and Re-evaluation  PLAN FOR NEXT SESSION: aquatic setting for core and le strengthening, gait training/toleration, stair climbing  land based for loaded strengthening, Optometrist instruction, gait training and HEP  Mayer Camel, PTA 10/02/22 2:42 PM [facility] [address] [place], [place], [address] Phone: [phone]   Fax:  [phone]

## 2022-10-04 ENCOUNTER — Other Ambulatory Visit: Payer: Self-pay | Admitting: Internal Medicine

## 2022-10-04 ENCOUNTER — Ambulatory Visit (HOSPITAL_BASED_OUTPATIENT_CLINIC_OR_DEPARTMENT_OTHER): Payer: PPO | Admitting: Physical Therapy

## 2022-10-04 ENCOUNTER — Encounter (HOSPITAL_BASED_OUTPATIENT_CLINIC_OR_DEPARTMENT_OTHER): Payer: Self-pay | Admitting: Physical Therapy

## 2022-10-04 ENCOUNTER — Other Ambulatory Visit: Payer: PPO

## 2022-10-04 DIAGNOSIS — E782 Mixed hyperlipidemia: Secondary | ICD-10-CM

## 2022-10-04 DIAGNOSIS — R921 Mammographic calcification found on diagnostic imaging of breast: Secondary | ICD-10-CM

## 2022-10-04 DIAGNOSIS — M25561 Pain in right knee: Secondary | ICD-10-CM | POA: Diagnosis not present

## 2022-10-04 DIAGNOSIS — M6281 Muscle weakness (generalized): Secondary | ICD-10-CM

## 2022-10-04 DIAGNOSIS — R2689 Other abnormalities of gait and mobility: Secondary | ICD-10-CM

## 2022-10-04 DIAGNOSIS — G8929 Other chronic pain: Secondary | ICD-10-CM

## 2022-10-04 DIAGNOSIS — E119 Type 2 diabetes mellitus without complications: Secondary | ICD-10-CM | POA: Diagnosis not present

## 2022-10-04 NOTE — Therapy (Addendum)
OUTPATIENT PHYSICAL THERAPY LOWER EXTREMITY TREATMENT   Patient Name: Kelly Farley MRN: [id number] DOB:1955-07-08, 67 y.o., female Today's Date: 10/04/2022  END OF SESSION:  PT End of Session - 10/04/22 1403     Visit Number 7    Number of Visits 16    Date for PT Re-Evaluation 10/16/22    PT Start Time 1402    PT Stop Time 1440    PT Time Calculation (min) 38 min              Past Medical History:  Diagnosis Date   Arthritis    Diabetes mellitus without complication (HCC)    Hyperlipemia    Hypertension    Other abnormal findings in urine 07/05/2021   Pain in right knee 02/14/2012   Palpitations    wore holter monitor 30 years ago; murmur no longer an issue per patient   Sleep apnea    Sweats, menopausal 10/20/2015   Past Surgical History:  Procedure Laterality Date   ABDOMINAL HYSTERECTOMY     total   BREAST BIOPSY Left 04/03/2022   stereo bx, calcs, RIBBON clip-path pending   BREAST BIOPSY Left 04/03/2022   MM LT BREAST BX W LOC DEV 1ST LESION IMAGE BX SPEC STEREO GUIDE 04/03/2022 [facility]   COLONOSCOPY WITH PROPOFOL N/A 08/18/2020   Procedure: COLONOSCOPY WITH PROPOFOL;  Surgeon: Pasty Spillers, MD;  Location: [facility];  Service: Endoscopy;  Laterality: N/A;   EYE SURGERY     JOINT REPLACEMENT  01/09/2012   right knee   TRANSFORAMINAL LUMBAR INTERBODY FUSION (TLIF) WITH PEDICLE SCREW FIXATION 2 LEVEL N/A 12/19/2021   Procedure: Lumbar Four- Five Lumbar Five-Sacral One Open laminectomy/Transforaminal Lumbar Interbody Fusion/Posterolateral instrumented fusion;  Surgeon: Jadene Pierini, MD;  Location: [facility];  Service: Neurosurgery;  Laterality: N/A;  RM 21/3C/TO FOLLOW   Patient Active Problem List   Diagnosis Date Noted   Morbid obesity with body mass index (BMI) of 40.0 or higher (HCC) 06/19/2022   Esophageal reflux 04/10/2022   Fusion of spine, lumbar region 04/10/2022   Spondylolisthesis of lumbar region 12/19/2021   Simple renal  cyst 08/22/2021   Other obesity 07/05/2021   Stage 3b chronic kidney disease (HCC) 07/05/2021   Cecal polyp    Polyp of sigmoid colon    Chronic kidney disease (CKD) 07/16/2019   Mixed hyperlipidemia 07/16/2018   Bronchitis 01/20/2016   Idiopathic gout, left ankle and foot 01/20/2016   Menopausal and female climacteric states 10/20/2015   Obstructive sleep apnea 07/16/2014   Type 2 diabetes mellitus without complications (HCC) 11/17/2013   Edema 04/23/2011   Unspecified osteoarthritis, unspecified site 07/16/2002   Hypertension 07/16/1998    PCP: Sherron Monday, MD   REFERRING PROVIDER: Andrena Mews, DO   REFERRING DIAG:  Bilateral primary osteoarthritis of knee  M25.561 (ICD-10-CM) - Pain in right knee  R26.2 (ICD-10-CM) - Difficulty in walking, not elsewhere classified    THERAPY DIAG:  Chronic pain of right knee  Chronic pain of left knee  Muscle weakness (generalized)  Other abnormalities of gait and mobility  Rationale for Evaluation and Treatment: Rehabilitation  ONSET DATE: exacerbated last year  SUBJECTIVE:   SUBJECTIVE STATEMENT: Pt reports that she continues to have pain in Lt heel when up and walking (up to 4/10), but no pain at rest.       EVAL:[place] experience with right knee.  Trying to avoid L TKR. I am retired.  Cared  for parents.  I am limited with walking a lot.  I used my cane down here use it as a precaution.  LB surgery in dec.  Sciatic pain in rle is gone. Right foot still has some numbness.  I think I could have walked another 100 ft before needing to sit. Use electric scooters in grocery stores although I would like to be able to walk through them.  I am walking a lot better since back surgery.  Walked in sand on beach this summer and it really hurt, increased pain left knee.  Right knee no pain just stiffness   PERTINENT HISTORY: Lumbar spondylolisthesis and stenosis : 2 level decompression and TLIF/PLF  12/21/21 PAIN:  Are you having pain?no : NPRS scale: current 0/10- at rest  Pain location:  Pain description:  Aggravating factors: walking, getting and out of car Relieving factors: sitting,lyingdown, tylenol  PRECAUTIONS: Fall  RED FLAGS: None   WEIGHT BEARING RESTRICTIONS: No  FALLS:  Has patient fallen in last 6 months? No  LIVING ENVIRONMENT: Lives with: lives alone Lives in: House/apartment Stairs: Yes: External: 4-5 steps; on left going up Has following equipment at home: Single point cane  OCCUPATION: retired  PLOF: Independent  PATIENT GOALS: Walk through grocery store, going to foot ball game, vacuuming/house chores    OBJECTIVE:    PATIENT SURVEYS:  FOTO risk adjusted 42% with goal of 61%  COGNITION: Overall cognitive status: Within functional limits for tasks assessed     SENSATION: Around right toes and heel residual numbness due to LB dyfunction.  Has improved since surgery    MUSCLE LENGTH: Hamstrings: Full knee extension   POSTURE: weight shift left and ue and shoulder guarding  PALPATION: L knee crepitus  LOWER EXTREMITY ROM:  Active ROM Right eval Left eval Left  9/17 Left 9/26  Hip flexion      Hip extension      Hip abduction      Hip adduction      Hip internal rotation      Hip external rotation      Knee flexion 112 92 92 100 seated scoot  Knee extension  -13 -17 -12 LAQ  Ankle dorsiflexion      Ankle plantarflexion      Ankle inversion      Ankle eversion       (Blank rows = not tested)  LOWER EXTREMITY MMT:  MMT Right eval Left eval Lt    Hip flexion 30.4 24.2   Hip extension     Hip abduction 26.2 22.8   Hip adduction     Hip internal rotation     Hip external rotation     Knee flexion     Knee extension 33.0 22.8   Ankle dorsiflexion     Ankle plantarflexion     Ankle inversion     Ankle eversion      (Blank rows = not tested)   FUNCTIONAL TESTS:  Timed up and go (TUG): 21.58 Berg Balance  Scale: 28/56 09/25/22: TUG : 18.96s without cane 10/04/22: 20.37s without cane; 15.17s with cane  GAIT: Distance walked: 400 ft Assistive device utilized: Single point cane Level of assistance: Modified independence Comments: Shifted right, no left heel strike/ TKE left, antalgic limp   TODAY'S TREATMENT:  Pt seen for aquatic therapy today.  Treatment took place in water 3.5-4.75 ft in depth at the [facility] pool. Temp of water was 91.  Pt entered/exited the pool via stairs in step-to pattern with bilat rail.  * holding barbell:  walking forward/ backward- multiple lengths, side stepping R/L cues for upright trunk * side stepping with arm addct with rainbow hand floats * light UE on wall: tandem gait forward/ backwards  * row motion with rainbow hand floats with marching; * holding wall: heel/toe raises x 12;  hip abdct/addct 3 x 5; squats x 10    semi-tandem stance (challenge);  tandem gait forward/ backward  * UE support on wall LE swings into hip flex/ext x 10 * solid noodle pull downs to thighs x 10 in standard stance * walking backwards/ forwards / side stepping without UE support (cues to relax arms under water)   Pt requires the buoyancy and hydrostatic pressure of water for support, and to offload joints by unweighting joint load by at least 50 % in navel deep water and by at least 75-80% in chest to neck deep water.  Viscosity of the water is needed for resistance of strengthening. Water current perturbations provides challenge to standing balance requiring increased core activation.   PATIENT EDUCATION:  Education details: aquatic exercise progressions/modifications  Person educated: Patient Education method: Explanation Education comprehension: verbalized understanding  HOME EXERCISE PROGRAM: TBA  ASSESSMENT:  CLINICAL IMPRESSION:  Pt  demonstrated improved Lt knee flexion ROM and TUG time.  Pt continues to be challenged with narrow base of support and SLS exercises.  She required less cues for more upright trunk today.  She tolerated all exercises well with only minor increase in Lt knee pain during session, when in Lt SLS.  Progressing well towards established goals. Therapist to assess goals including Sharlene Motts next visit for possible re-certification for land based intervention and to establish aquatic HEP.     EVAL: Patient is a 67 y.o. f who was seen today for physical therapy evaluation and treatment for OA knee L.  She has had a R TKR x 10 yrs ago and a recent decompression and TLIF/PLF lumbar spine. She reports improved amb toleration and ability since LB surgery.  She presents today with le and core weakness. Functional testing positive for balance dysfunction/high fall risk and poor mobility. Pain left knee with crepitus. She has muscle weakness throughout her LE and core and gait deviation avoiding TKE left offsetting towards right.  She will benefit from skilled physical therapy intervention initially in aquatic setting for core and le strengthening, gait training/toleration, stair climbing then progress to land based for loaded strengthening, body mechanic instruction, gait training and HEP.  OBJECTIVE IMPAIRMENTS: Abnormal gait, decreased activity tolerance, decreased balance, decreased endurance, decreased mobility, difficulty walking, decreased ROM, decreased strength, improper body mechanics, obesity, and pain.   ACTIVITY LIMITATIONS: carrying, lifting, bending, standing, squatting, sleeping, stairs, transfers, and locomotion level  PARTICIPATION LIMITATIONS: meal prep, cleaning, laundry, shopping, community activity, and yard work  PERSONAL FACTORS: Age, Fitness, and 1-2 comorbidities: DM and morbid obesity  are also affecting patient's functional outcome.   REHAB POTENTIAL: Good  CLINICAL DECISION MAKING:  Evolving/moderate complexity  EVALUATION COMPLEXITY: Moderate   GOALS: Goals reviewed with patient? Yes  SHORT TERM GOALS: Target date: 09/18/22 Pt will tolerate full aquatic sessions consistently without increase in pain and with improving function to demonstrate good toleration and effectiveness of intervention.  Baseline: Goal status: Met  09/27/22  2.  Pt will improve on Tug test to <or=  17 to demonstrate improvement in lower extremity function, mobility and decreased fall risk. Baseline: 21.58at eval Goal status: Met - 10/04/22  3.  Pt will improve with transfers in and out of car with increase in pain Baseline:  Goal status: met - per her report- 10/04/22  4.  L knee flex to 100d and extension towards 0 for improved gait pattern Baseline: see chart Goal status: Partially met -  10/04/22  5.  Pt will report walking through grocery store pushing cart for up towards 25 minutes (personal goal) Baseline: electric cart at eval;  pushing cart at drug store for 10 min- 10/04/22 Goal status: In Progress.     LONG TERM GOALS: Target date: 10/16/22  Pt to meet stated Foto Goal 61% Baseline: risk adjusted 42% Goal status: INITIAL  2.  Pt will be indep with final HEP's (land and aquatic as appropriate) for continued management of condition Baseline:  Goal status: INITIAL  3.  Pt will improve strength in all areas listed by at least 10 lbs to demonstrate improved overall physical function  Baseline:  Goal status: INITIAL  4.  Pt will improve on Berg balance test to >/=45/56 to demonstrate a decrease in fall risk. Baseline: 28/56 Goal status: INITIAL  5.  Pt will report toleration to vacuuming and other household chores Baseline: avoids Goal status: INITIAL  6.  Pt will have a reduction in her worst pain to 4/10 or less to demonstrate improvement management of condition Baseline:  Goal status: INITIAL   PLAN:  PT FREQUENCY: 1-2x/week  PT DURATION: 8 weeks  PLANNED  INTERVENTIONS: Therapeutic exercises, Therapeutic activity, Neuromuscular re-education, Balance training, Gait training, Patient/Family education, Self Care, Joint mobilization, Stair training, Orthotic/Fit training, DME instructions, Aquatic Therapy, Dry Needling, Electrical stimulation, Moist heat, Taping, Ionotophoresis 4mg /ml Dexamethasone, Manual therapy, and Re-evaluation  PLAN FOR NEXT SESSION: aquatic setting for core and LE strengthening, gait training/toleration, stair climbing  land based for loaded strengthening, Optometrist instruction, gait training and HEP  Mayer Camel, PTA 10/04/22 4:52 PM [facility] [address] [place], [place], [address] Phone: [phone]   Fax:  [phone]

## 2022-10-05 LAB — COMPREHENSIVE METABOLIC PANEL
ALT: 15 [IU]/L (ref 0–32)
AST: 30 [IU]/L (ref 0–40)
Albumin: 4 g/dL (ref 3.9–4.9)
Alkaline Phosphatase: 82 [IU]/L (ref 44–121)
BUN/Creatinine Ratio: 15 (ref 12–28)
BUN: 19 mg/dL (ref 8–27)
Bilirubin Total: 0.4 mg/dL (ref 0.0–1.2)
CO2: 24 mmol/L (ref 20–29)
Calcium: 10 mg/dL (ref 8.7–10.3)
Chloride: 100 mmol/L (ref 96–106)
Creatinine, Ser: 1.23 mg/dL — ABNORMAL HIGH (ref 0.57–1.00)
Globulin, Total: 2.7 g/dL (ref 1.5–4.5)
Glucose: 108 mg/dL — ABNORMAL HIGH (ref 70–99)
Potassium: 4 mmol/L (ref 3.5–5.2)
Sodium: 141 mmol/L (ref 134–144)
Total Protein: 6.7 g/dL (ref 6.0–8.5)
eGFR: 48 mL/min/{1.73_m2} — ABNORMAL LOW (ref >59)

## 2022-10-05 LAB — HEMOGLOBIN A1C
Est. average glucose Bld gHb Est-mCnc: 111 mg/dL
Hgb A1c MFr Bld: 5.5 % (ref 4.8–5.6)

## 2022-10-05 LAB — LIPID PANEL
Chol/HDL Ratio: 2.6 {ratio} (ref 0.0–4.4)
Cholesterol, Total: 147 mg/dL (ref 100–199)
HDL: 56 mg/dL (ref >39)
LDL Chol Calc (NIH): 76 mg/dL (ref 0–99)
Triglycerides: 78 mg/dL (ref 0–149)
VLDL Cholesterol Cal: 15 mg/dL (ref 5–40)

## 2022-10-05 LAB — CK: Total CK: 118 U/L (ref 32–182)

## 2022-10-09 ENCOUNTER — Ambulatory Visit (HOSPITAL_BASED_OUTPATIENT_CLINIC_OR_DEPARTMENT_OTHER): Payer: PPO | Attending: Internal Medicine | Admitting: Physical Therapy

## 2022-10-09 ENCOUNTER — Encounter (HOSPITAL_BASED_OUTPATIENT_CLINIC_OR_DEPARTMENT_OTHER): Payer: Self-pay | Admitting: Physical Therapy

## 2022-10-09 DIAGNOSIS — G8929 Other chronic pain: Secondary | ICD-10-CM | POA: Diagnosis not present

## 2022-10-09 DIAGNOSIS — M6281 Muscle weakness (generalized): Secondary | ICD-10-CM | POA: Diagnosis not present

## 2022-10-09 DIAGNOSIS — R2689 Other abnormalities of gait and mobility: Secondary | ICD-10-CM | POA: Insufficient documentation

## 2022-10-09 DIAGNOSIS — M25562 Pain in left knee: Secondary | ICD-10-CM | POA: Insufficient documentation

## 2022-10-09 DIAGNOSIS — M25561 Pain in right knee: Secondary | ICD-10-CM | POA: Insufficient documentation

## 2022-10-09 NOTE — Therapy (Signed)
OUTPATIENT PHYSICAL THERAPY LOWER EXTREMITY Progress note/re-cert Progress Note Reporting Period 08/21/22 to 10/09/22  See note below for Objective Data and Assessment of Progress/Goals.       Patient Name: Charlestine Skehan MRN: [id number] DOB:01-Jul-1955, 67 y.o., female Today's Date: 10/09/2022  END OF SESSION:  PT End of Session - 10/09/22 1424     Visit Number 8    Number of Visits 16    Date for PT Re-Evaluation 11/09/22    Progress Note Due on Visit 18    PT Start Time 1400    PT Stop Time 1445    PT Time Calculation (min) 45 min    Activity Tolerance Patient tolerated treatment well    Behavior During Therapy [facility] for tasks assessed/performed               Past Medical History:  Diagnosis Date   Arthritis    Diabetes mellitus without complication (HCC)    Hyperlipemia    Hypertension    Other abnormal findings in urine 07/05/2021   Pain in right knee 02/14/2012   Palpitations    wore holter monitor 30 years ago; murmur no longer an issue per patient   Sleep apnea    Sweats, menopausal 10/20/2015   Past Surgical History:  Procedure Laterality Date   ABDOMINAL HYSTERECTOMY     total   BREAST BIOPSY Left 04/03/2022   stereo bx, calcs, RIBBON clip-path pending   BREAST BIOPSY Left 04/03/2022   MM LT BREAST BX W LOC DEV 1ST LESION IMAGE BX SPEC STEREO GUIDE 04/03/2022 [facility]   COLONOSCOPY WITH PROPOFOL N/A 08/18/2020   Procedure: COLONOSCOPY WITH PROPOFOL;  Surgeon: Pasty Spillers, MD;  Location: [facility];  Service: Endoscopy;  Laterality: N/A;   EYE SURGERY     JOINT REPLACEMENT  01/09/2012   right knee   TRANSFORAMINAL LUMBAR INTERBODY FUSION (TLIF) WITH PEDICLE SCREW FIXATION 2 LEVEL N/A 12/19/2021   Procedure: Lumbar Four- Five Lumbar Five-Sacral One Open laminectomy/Transforaminal Lumbar Interbody Fusion/Posterolateral instrumented fusion;  Surgeon: Jadene Pierini, MD;  Location: [facility];  Service: Neurosurgery;  Laterality: N/A;   RM 21/3C/TO FOLLOW   Patient Active Problem List   Diagnosis Date Noted   Morbid obesity with body mass index (BMI) of 40.0 or higher (HCC) 06/19/2022   Esophageal reflux 04/10/2022   Fusion of spine, lumbar region 04/10/2022   Spondylolisthesis of lumbar region 12/19/2021   Simple renal cyst 08/22/2021   Other obesity 07/05/2021   Stage 3b chronic kidney disease (HCC) 07/05/2021   Cecal polyp    Polyp of sigmoid colon    Chronic kidney disease (CKD) 07/16/2019   Mixed hyperlipidemia 07/16/2018   Bronchitis 01/20/2016   Idiopathic gout, left ankle and foot 01/20/2016   Menopausal and female climacteric states 10/20/2015   Obstructive sleep apnea 07/16/2014   Type 2 diabetes mellitus without complications (HCC) 11/17/2013   Edema 04/23/2011   Unspecified osteoarthritis, unspecified site 07/16/2002   Hypertension 07/16/1998    PCP: Sherron Monday, MD   REFERRING PROVIDER: Andrena Mews, DO   REFERRING DIAG:  Bilateral primary osteoarthritis of knee  M25.561 (ICD-10-CM) - Pain in right knee  R26.2 (ICD-10-CM) - Difficulty in walking, not elsewhere classified    THERAPY DIAG:  Chronic pain of right knee  Chronic pain of left knee  Muscle weakness (generalized)  Other abnormalities of gait and mobility  Rationale for Evaluation and Treatment: Rehabilitation  ONSET DATE: exacerbated last year  SUBJECTIVE:   SUBJECTIVE STATEMENT: Worst  pain 4/10 knee infrequent. Left heel hurts more then knee.  I have walked through a pharmacy pushing cart for about 30 minutes.      EVAL[place] with right knee.  Trying to avoid L TKR. I am retired.  Cared for parents.  I am limited with walking a lot.  I used my cane down here use it as a precaution.  LB surgery in dec.  Sciatic pain in rle is gone. Right foot still has some numbness.  I think I could have walked another 100 ft before needing to sit. Use electric scooters in grocery stores  although I would like to be able to walk through them.  I am walking a lot better since back surgery.  Walked in sand on beach this summer and it really hurt, increased pain left knee.  Right knee no pain just stiffness   PERTINENT HISTORY: Lumbar spondylolisthesis and stenosis : 2 level decompression and TLIF/PLF 12/21/21 PAIN:  Are you having pain?no : NPRS scale: current 0/10- at rest  Pain location:  Pain description:  Aggravating factors: walking, getting and out of car Relieving factors: sitting,lyingdown, tylenol  PRECAUTIONS: Fall  RED FLAGS: None   WEIGHT BEARING RESTRICTIONS: No  FALLS:  Has patient fallen in last 6 months? No  LIVING ENVIRONMENT: Lives with: lives alone Lives in: House/apartment Stairs: Yes: External: 4-5 steps; on left going up Has following equipment at home: Single point cane  OCCUPATION: retired  PLOF: Independent  PATIENT GOALS: Walk through grocery store, going to foot ball game, vacuuming/house chores    OBJECTIVE:    PATIENT SURVEYS:  FOTO risk adjusted 42% with goal of 61% 10/09/22: 57%  COGNITION: Overall cognitive status: Within functional limits for tasks assessed     SENSATION: Around right toes and heel residual numbness due to LB dyfunction.  Has improved since surgery    MUSCLE LENGTH: Hamstrings: Full knee extension   POSTURE: weight shift left and ue and shoulder guarding  PALPATION: L knee crepitus  LOWER EXTREMITY ROM:  Active ROM Right eval Left eval Left  9/17 Left 9/26  Hip flexion      Hip extension      Hip abduction      Hip adduction      Hip internal rotation      Hip external rotation      Knee flexion 112 92 92 100 seated scoot  Knee extension  -13 -17 -12 LAQ  Ankle dorsiflexion      Ankle plantarflexion      Ankle inversion      Ankle eversion       (Blank rows = not tested)  LOWER EXTREMITY MMT:  MMT Right eval Left eval Lt    Hip flexion 30.4 24.2   Hip extension      Hip abduction 26.2 22.8   Hip adduction     Hip internal rotation     Hip external rotation     Knee flexion     Knee extension 33.0 22.8   Ankle dorsiflexion     Ankle plantarflexion     Ankle inversion     Ankle eversion      (Blank rows = not tested)   FUNCTIONAL TESTS:  Timed up and go (TUG): 21.58 Berg Balance Scale: 28/56 09/25/22: TUG : 18.96s without cane 10/04/22: 20.37s without cane; 15.17s with cane  10/09/22: Berg 42/56  GAIT: Distance walked: 400 ft Assistive device utilized:  Single point cane Level of assistance: Modified independence Comments: Shifted right, no left heel strike/ TKE left, antalgic limp   TODAY'S TREATMENT:       Re-assessment Berg                                                                                                                         Pt seen for aquatic therapy today.  Treatment took place in water 3.5-4.75 ft in depth at the [facility] pool. Temp of water was 91.  Pt entered/exited the pool via stairs in step-to pattern with bilat rail.  *  walking forward/ backward- multiple lengths, side stepping R/L cues for upright trunk * side stepping with arm addct with rainbow hand floats * UE yellow HB: tandem gait forward/ backwards  *tandem stance leading R/L x 20s *SLS R/L ue support yellow HB *standing ue support yellow HB: df; pf; hip abdct/addct 2x5;  squats x 10; hip flex ext *Solid Noodle kick down hip in neutral position then externally rotated R/L x 10 ea    Pt requires the buoyancy and hydrostatic pressure of water for support, and to offload joints by unweighting joint load by at least 50 % in navel deep water and by at least 75-80% in chest to neck deep water.  Viscosity of the water is needed for resistance of strengthening. Water current perturbations provides challenge to standing balance requiring increased core activation.   PATIENT EDUCATION:  Education details: aquatic exercise  progressions/modifications  Person educated: Patient Education method: Explanation Education comprehension: verbalized understanding  HOME EXERCISE PROGRAM: TBA  ASSESSMENT:  CLINICAL IMPRESSION:  PN: Pt has made good progress since onset of skilled PT.  She has been seen in aquatics only up to this point and had met quite a few land based goals. Her berg balance score has significantly increased dropping her fall risk, Knee ROM and Tug score improved (see charts above) as well as overall knee pain decreasing.  She does report left heel pain possibly from a flared plantar fasciitis which with icing has decreased some. We progressed balance training today with good toleration.  Decreased submersion to increase load and challenge. Plan to begin land based intervention next session for increased loaded exercise, and progression of gait and balance training. Will extend certification through end of month.  Pt issued copy of pools in area.   OBJECTIVE IMPAIRMENTS: Abnormal gait, decreased activity tolerance, decreased balance, decreased endurance, decreased mobility, difficulty walking, decreased ROM, decreased strength, improper body mechanics, obesity, and pain.   ACTIVITY LIMITATIONS: carrying, lifting, bending, standing, squatting, sleeping, stairs, transfers, and locomotion level  PARTICIPATION LIMITATIONS: meal prep, cleaning, laundry, shopping, community activity, and yard work  PERSONAL FACTORS: Age, Fitness, and 1-2 comorbidities: DM and morbid obesity  are also affecting patient's functional outcome.   REHAB POTENTIAL: Good  CLINICAL DECISION MAKING: Evolving/moderate complexity  EVALUATION COMPLEXITY: Moderate   GOALS: Goals reviewed with patient? Yes  SHORT TERM GOALS: Target date: 09/18/22 Pt will  tolerate full aquatic sessions consistently without increase in pain and with improving function to demonstrate good toleration and effectiveness of intervention.  Baseline: Goal  status: Met  09/27/22  2.  Pt will improve on Tug test to <or=  17 to demonstrate improvement in lower extremity function, mobility and decreased fall risk. Baseline: 21.58at eval Goal status: Met - 10/04/22  3.  Pt will improve with transfers in and out of car with increase in pain Baseline:  Goal status: met - per her report- 10/04/22  4.  L knee flex to 100d and extension towards 0 for improved gait pattern Baseline: see chart Goal status: Partially met -  10/04/22  5.  Pt will report walking through grocery store pushing cart for up towards 25 minutes (personal goal) Baseline: electric cart at eval;  pushing cart at drug store for 10 min- 10/04/22 Goal status: In Progress. Met 10/09/22    LONG TERM GOALS: Target date: 10/16/22  Pt to meet stated Foto Goal 61% Baseline: risk adjusted 42% Goal status: INITIAL  2.  Pt will be indep with final HEP's (land and aquatic as appropriate) for continued management of condition Baseline:  Goal status: INITIAL  3.  Pt will improve strength in all areas listed by at least 10 lbs to demonstrate improved overall physical function  Baseline:  Goal status: INITIAL  4.  Pt will improve on Berg balance test to >/=45/56 to demonstrate a decrease in fall risk. Baseline: 28/56; 42/56 Goal status: In progress 10/09/22  5.  Pt will report toleration to vacuuming and other household chores Baseline: avoids Goal status: INITIAL  6.  Pt will have a reduction in her worst pain to 4/10 or less to demonstrate improvement management of condition Baseline:  Goal status: INITIAL   PLAN:  PT FREQUENCY: 1-2x/week  PT DURATION: 4 weeks  PLANNED INTERVENTIONS: Therapeutic exercises, Therapeutic activity, Neuromuscular re-education, Balance training, Gait training, Patient/Family education, Self Care, Joint mobilization, Stair training, Orthotic/Fit training, DME instructions, Aquatic Therapy, Dry Needling, Electrical stimulation, Moist heat, Taping,  Ionotophoresis 4mg /ml Dexamethasone, Manual therapy, and Re-evaluation  PLAN FOR NEXT SESSION: aquatic setting for core and LE strengthening, gait training/toleration, stair climbing  land based for loaded strengthening, Optometrist instruction, gait training and HEP  Rushie Chestnut) Georg Ang MPT 10/09/22 2:56 PM [facility] [address] [place], [place], [address] Phone: [phone]   Fax:  [phone]

## 2022-10-10 NOTE — Progress Notes (Signed)
Patient notified

## 2022-10-11 ENCOUNTER — Ambulatory Visit (HOSPITAL_BASED_OUTPATIENT_CLINIC_OR_DEPARTMENT_OTHER): Payer: PPO

## 2022-10-11 ENCOUNTER — Encounter (HOSPITAL_BASED_OUTPATIENT_CLINIC_OR_DEPARTMENT_OTHER): Payer: Self-pay

## 2022-10-11 DIAGNOSIS — M25561 Pain in right knee: Secondary | ICD-10-CM | POA: Diagnosis not present

## 2022-10-11 DIAGNOSIS — R2689 Other abnormalities of gait and mobility: Secondary | ICD-10-CM

## 2022-10-11 DIAGNOSIS — M6281 Muscle weakness (generalized): Secondary | ICD-10-CM

## 2022-10-11 DIAGNOSIS — G8929 Other chronic pain: Secondary | ICD-10-CM

## 2022-10-11 NOTE — Therapy (Signed)
OUTPATIENT PHYSICAL THERAPY LOWER EXTREMITY Progress note/re-cert Progress Note Reporting Period 08/21/22 to 10/09/22  See note below for Objective Data and Assessment of Progress/Goals.       Patient Name: Kelly Farley MRN: [id number] DOB:1955/09/22, 67 y.o., female Today's Date: 10/11/2022  END OF SESSION:  PT End of Session - 10/11/22 1501     Visit Number 9    Number of Visits 16    Date for PT Re-Evaluation 11/09/22    Progress Note Due on Visit 18    PT Start Time 1348    PT Stop Time 1430    PT Time Calculation (min) 42 min    Activity Tolerance Patient tolerated treatment well    Behavior During Therapy [facility] for tasks assessed/performed                Past Medical History:  Diagnosis Date   Arthritis    Diabetes mellitus without complication (HCC)    Hyperlipemia    Hypertension    Other abnormal findings in urine 07/05/2021   Pain in right knee 02/14/2012   Palpitations    wore holter monitor 30 years ago; murmur no longer an issue per patient   Sleep apnea    Sweats, menopausal 10/20/2015   Past Surgical History:  Procedure Laterality Date   ABDOMINAL HYSTERECTOMY     total   BREAST BIOPSY Left 04/03/2022   stereo bx, calcs, RIBBON clip-path pending   BREAST BIOPSY Left 04/03/2022   MM LT BREAST BX W LOC DEV 1ST LESION IMAGE BX SPEC STEREO GUIDE 04/03/2022 [facility]   COLONOSCOPY WITH PROPOFOL N/A 08/18/2020   Procedure: COLONOSCOPY WITH PROPOFOL;  Surgeon: Pasty Spillers, MD;  Location: [facility];  Service: Endoscopy;  Laterality: N/A;   EYE SURGERY     JOINT REPLACEMENT  01/09/2012   right knee   TRANSFORAMINAL LUMBAR INTERBODY FUSION (TLIF) WITH PEDICLE SCREW FIXATION 2 LEVEL N/A 12/19/2021   Procedure: Lumbar Four- Five Lumbar Five-Sacral One Open laminectomy/Transforaminal Lumbar Interbody Fusion/Posterolateral instrumented fusion;  Surgeon: Jadene Pierini, MD;  Location: [facility];  Service: Neurosurgery;  Laterality: N/A;   RM 21/3C/TO FOLLOW   Patient Active Problem List   Diagnosis Date Noted   Morbid obesity with body mass index (BMI) of 40.0 or higher (HCC) 06/19/2022   Esophageal reflux 04/10/2022   Fusion of spine, lumbar region 04/10/2022   Spondylolisthesis of lumbar region 12/19/2021   Simple renal cyst 08/22/2021   Other obesity 07/05/2021   Stage 3b chronic kidney disease (HCC) 07/05/2021   Cecal polyp    Polyp of sigmoid colon    Chronic kidney disease (CKD) 07/16/2019   Mixed hyperlipidemia 07/16/2018   Bronchitis 01/20/2016   Idiopathic gout, left ankle and foot 01/20/2016   Menopausal and female climacteric states 10/20/2015   Obstructive sleep apnea 07/16/2014   Type 2 diabetes mellitus without complications (HCC) 11/17/2013   Edema 04/23/2011   Unspecified osteoarthritis, unspecified site 07/16/2002   Hypertension 07/16/1998    PCP: Sherron Monday, MD   REFERRING PROVIDER: Andrena Mews, DO   REFERRING DIAG:  Bilateral primary osteoarthritis of knee  M25.561 (ICD-10-CM) - Pain in right knee  R26.2 (ICD-10-CM) - Difficulty in walking, not elsewhere classified    THERAPY DIAG:  Chronic pain of right knee  Chronic pain of left knee  Other abnormalities of gait and mobility  Muscle weakness (generalized)  Rationale for Evaluation and Treatment: Rehabilitation  ONSET DATE: exacerbated last year  SUBJECTIVE:   SUBJECTIVE STATEMENT:  Pt reports no pain at rest in knees. Still having L heel pain, which has been improving. Using ice on it daily.       EVAL:[place] years [place] Had bad experience with right knee.  Trying to avoid L TKR. I am retired.  Cared for parents.  I am limited with walking a lot.  I used my cane down here use it as a precaution.  LB surgery in dec.  Sciatic pain in rle is gone. Right foot still has some numbness.  I think I could have walked another 100 ft before needing to sit. Use electric scooters in grocery stores although I would like  to be able to walk through them.  I am walking a lot better since back surgery.  Walked in sand on beach this summer and it really hurt, increased pain left knee.  Right knee no pain just stiffness   PERTINENT HISTORY: Lumbar spondylolisthesis and stenosis : 2 level decompression and TLIF/PLF 12/21/21 PAIN:  Are you having pain?no : NPRS scale: current 0/10- at rest  Pain location:  Pain description:  Aggravating factors: walking, getting and out of car Relieving factors: sitting,lyingdown, tylenol  PRECAUTIONS: Fall  RED FLAGS: None   WEIGHT BEARING RESTRICTIONS: No  FALLS:  Has patient fallen in last 6 months? No  LIVING ENVIRONMENT: Lives with: lives alone Lives in: House/apartment Stairs: Yes: External: 4-5 steps; on left going up Has following equipment at home: Single point cane  OCCUPATION: retired  PLOF: Independent  PATIENT GOALS: Walk through grocery store, going to foot ball game, vacuuming/house chores    OBJECTIVE:    PATIENT SURVEYS:  FOTO risk adjusted 42% with goal of 61% 10/09/22: 57%  COGNITION: Overall cognitive status: Within functional limits for tasks assessed     SENSATION: Around right toes and heel residual numbness due to LB dyfunction.  Has improved since surgery    MUSCLE LENGTH: Hamstrings: Full knee extension   POSTURE: weight shift left and ue and shoulder guarding  PALPATION: L knee crepitus  LOWER EXTREMITY ROM:  Active ROM Right eval Left eval Left  9/17 Left 9/26  Hip flexion      Hip extension      Hip abduction      Hip adduction      Hip internal rotation      Hip external rotation      Knee flexion 112 92 92 100 seated scoot  Knee extension  -13 -17 -12 LAQ  Ankle dorsiflexion      Ankle plantarflexion      Ankle inversion      Ankle eversion       (Blank rows = not tested)  LOWER EXTREMITY MMT:  MMT Right eval Left eval Lt    Hip flexion 30.4 24.2   Hip extension     Hip abduction 26.2  22.8   Hip adduction     Hip internal rotation     Hip external rotation     Knee flexion     Knee extension 33.0 22.8   Ankle dorsiflexion     Ankle plantarflexion     Ankle inversion     Ankle eversion      (Blank rows = not tested)   FUNCTIONAL TESTS:  Timed up and go (TUG): 21.58 Berg Balance Scale: 28/56 09/25/22: TUG : 18.96s without cane 10/04/22: 20.37s without cane; 15.17s with cane  10/09/22: Berg 42/56  GAIT: Distance walked: 400 ft Assistive device utilized:  Single point cane Level of assistance: Modified independence Comments: Shifted right, no left heel strike/ TKE left, antalgic limp   TODAY'S TREATMENT:        10/3:  -Piriformis stretch -LTR -DKTC with physioball (red) x10 -Supine march with TrA 2x10 -Sidelying clams 2x10ea Retro walking along rail x2 laps -Standing hip abd/ext 2x5ea  -Seated march 2x10 -Standing tB row/ext with TRA RTB x10ea -HEP   Previous: Re-assessment Berg                                                                                                                         Pt seen for aquatic therapy today.  Treatment took place in water 3.5-4.75 ft in depth at the [facility] pool. Temp of water was 91.  Pt entered/exited the pool via stairs in step-to pattern with bilat rail.  *  walking forward/ backward- multiple lengths, side stepping R/L cues for upright trunk * side stepping with arm addct with rainbow hand floats * UE yellow HB: tandem gait forward/ backwards  *tandem stance leading R/L x 20s *SLS R/L ue support yellow HB *standing ue support yellow HB: df; pf; hip abdct/addct 2x5;  squats x 10; hip flex ext *Solid Noodle kick down hip in neutral position then externally rotated R/L x 10 ea    Pt requires the buoyancy and hydrostatic pressure of water for support, and to offload joints by unweighting joint load by at least 50 % in navel deep water and by at least 75-80% in chest to neck deep water.   Viscosity of the water is needed for resistance of strengthening. Water current perturbations provides challenge to standing balance requiring increased core activation.   PATIENT EDUCATION:  Education details: aquatic exercise progressions/modifications  Person educated: Patient Education method: Explanation Education comprehension: verbalized understanding  HOME EXERCISE PROGRAM: Access Code: [email or web address] URL: [email or web address] Date: 10/11/2022 Prepared by: Riki Altes  Exercises - Supine Lower Trunk Rotation  - 2 x daily - 7 x weekly - 2-3 sets - 10 reps - 5seconds hold - Hooklying Single Knee to Chest Stretch  - 2 x daily - 7 x weekly - 3 sets - 20seconds hold - Clamshell  - 2 x daily - 7 x weekly - 2-3 sets - 10 reps - Supine March  - 2 x daily - 7 x weekly - 3 sets - 10 reps  ASSESSMENT:  CLINICAL IMPRESSION: Pt with difficulty laying supine initially, but become more tolerable after a few minutes. Able to complete gentle core/lumbar stabilization interventions with overall good tolerance. Educated pt about proper muscle engagement and avoiding quick movements. Pt fatigued by gentle exercises and did report some L knee discomfort with standing rows/ext. Provided pt with land based exercises with instructions to stop if pain occurs. Will assess response to exercises next visit and modify/progress as appropriate.    OBJECTIVE IMPAIRMENTS: Abnormal gait, decreased activity tolerance, decreased balance, decreased endurance, decreased mobility, difficulty walking, decreased ROM, decreased strength, improper body mechanics,  obesity, and pain.   ACTIVITY LIMITATIONS: carrying, lifting, bending, standing, squatting, sleeping, stairs, transfers, and locomotion level  PARTICIPATION LIMITATIONS: meal prep, cleaning, laundry, shopping, community activity, and yard work  PERSONAL FACTORS: Age, Fitness, and 1-2 comorbidities: DM and morbid obesity  are also affecting  patient's functional outcome.   REHAB POTENTIAL: Good  CLINICAL DECISION MAKING: Evolving/moderate complexity  EVALUATION COMPLEXITY: Moderate   GOALS: Goals reviewed with patient? Yes  SHORT TERM GOALS: Target date: 09/18/22 Pt will tolerate full aquatic sessions consistently without increase in pain and with improving function to demonstrate good toleration and effectiveness of intervention.  Baseline: Goal status: Met  09/27/22  2.  Pt will improve on Tug test to <or=  17 to demonstrate improvement in lower extremity function, mobility and decreased fall risk. Baseline: 21.58at eval Goal status: Met - 10/04/22  3.  Pt will improve with transfers in and out of car with increase in pain Baseline:  Goal status: met - per her report- 10/04/22  4.  L knee flex to 100d and extension towards 0 for improved gait pattern Baseline: see chart Goal status: Partially met -  10/04/22  5.  Pt will report walking through grocery store pushing cart for up towards 25 minutes (personal goal) Baseline: electric cart at eval;  pushing cart at drug store for 10 min- 10/04/22 Goal status: In Progress. Met 10/09/22    LONG TERM GOALS: Target date: 10/16/22  Pt to meet stated Foto Goal 61% Baseline: risk adjusted 42% Goal status: INITIAL  2.  Pt will be indep with final HEP's (land and aquatic as appropriate) for continued management of condition Baseline:  Goal status: INITIAL  3.  Pt will improve strength in all areas listed by at least 10 lbs to demonstrate improved overall physical function  Baseline:  Goal status: INITIAL  4.  Pt will improve on Berg balance test to >/=45/56 to demonstrate a decrease in fall risk. Baseline: 28/56; 42/56 Goal status: In progress 10/09/22  5.  Pt will report toleration to vacuuming and other household chores Baseline: avoids Goal status: INITIAL  6.  Pt will have a reduction in her worst pain to 4/10 or less to demonstrate improvement management of  condition Baseline:  Goal status: INITIAL   PLAN:  PT FREQUENCY: 1-2x/week  PT DURATION: 4 weeks  PLANNED INTERVENTIONS: Therapeutic exercises, Therapeutic activity, Neuromuscular re-education, Balance training, Gait training, Patient/Family education, Self Care, Joint mobilization, Stair training, Orthotic/Fit training, DME instructions, Aquatic Therapy, Dry Needling, Electrical stimulation, Moist heat, Taping, Ionotophoresis 4mg /ml Dexamethasone, Manual therapy, and Re-evaluation  PLAN FOR NEXT SESSION: aquatic setting for core and LE strengthening, gait training/toleration, stair climbing  land based for loaded strengthening, Optometrist instruction, gait training and HEP  Riki Altes, PTA  10/11/22 3:12 PM [facility] [address] [place], [place], [address] Phone: [phone]   Fax:  [phone]

## 2022-10-16 ENCOUNTER — Encounter (HOSPITAL_BASED_OUTPATIENT_CLINIC_OR_DEPARTMENT_OTHER): Payer: Self-pay

## 2022-10-16 ENCOUNTER — Ambulatory Visit (HOSPITAL_BASED_OUTPATIENT_CLINIC_OR_DEPARTMENT_OTHER): Payer: PPO

## 2022-10-16 DIAGNOSIS — M25561 Pain in right knee: Secondary | ICD-10-CM | POA: Diagnosis not present

## 2022-10-16 DIAGNOSIS — M6281 Muscle weakness (generalized): Secondary | ICD-10-CM

## 2022-10-16 DIAGNOSIS — G8929 Other chronic pain: Secondary | ICD-10-CM

## 2022-10-16 DIAGNOSIS — R2689 Other abnormalities of gait and mobility: Secondary | ICD-10-CM

## 2022-10-16 NOTE — Therapy (Signed)
OUTPATIENT PHYSICAL THERAPY LOWER EXTREMITY TREATMENT      Patient Name: Kelly Farley MRN: [id number] DOB:July 23, 1955, 67 y.o., female Today's Date: 10/16/2022  END OF SESSION:  PT End of Session - 10/16/22 1344     Visit Number 10    Number of Visits 16    Date for PT Re-Evaluation 11/09/22    Progress Note Due on Visit 18    PT Start Time 1348    PT Stop Time 1430    PT Time Calculation (min) 42 min    Activity Tolerance Patient tolerated treatment well    Behavior During Therapy [facility] for tasks assessed/performed                 Past Medical History:  Diagnosis Date   Arthritis    Diabetes mellitus without complication (HCC)    Hyperlipemia    Hypertension    Other abnormal findings in urine 07/05/2021   Pain in right knee 02/14/2012   Palpitations    wore holter monitor 30 years ago; murmur no longer an issue per patient   Sleep apnea    Sweats, menopausal 10/20/2015   Past Surgical History:  Procedure Laterality Date   ABDOMINAL HYSTERECTOMY     total   BREAST BIOPSY Left 04/03/2022   stereo bx, calcs, RIBBON clip-path pending   BREAST BIOPSY Left 04/03/2022   MM LT BREAST BX W LOC DEV 1ST LESION IMAGE BX SPEC STEREO GUIDE 04/03/2022 [facility]   COLONOSCOPY WITH PROPOFOL N/A 08/18/2020   Procedure: COLONOSCOPY WITH PROPOFOL;  Surgeon: Pasty Spillers, MD;  Location: [facility];  Service: Endoscopy;  Laterality: N/A;   EYE SURGERY     JOINT REPLACEMENT  01/09/2012   right knee   TRANSFORAMINAL LUMBAR INTERBODY FUSION (TLIF) WITH PEDICLE SCREW FIXATION 2 LEVEL N/A 12/19/2021   Procedure: Lumbar Four- Five Lumbar Five-Sacral One Open laminectomy/Transforaminal Lumbar Interbody Fusion/Posterolateral instrumented fusion;  Surgeon: Jadene Pierini, MD;  Location: [facility] OR;  Service: Neurosurgery;  Laterality: N/A;  RM 21/3C/TO FOLLOW   Patient Active Problem List   Diagnosis Date Noted   Morbid obesity with body mass index (BMI) of 40.0 or  higher (HCC) 06/19/2022   Esophageal reflux 04/10/2022   Fusion of spine, lumbar region 04/10/2022   Spondylolisthesis of lumbar region 12/19/2021   Simple renal cyst 08/22/2021   Other obesity 07/05/2021   Stage 3b chronic kidney disease (HCC) 07/05/2021   Cecal polyp    Polyp of sigmoid colon    Chronic kidney disease (CKD) 07/16/2019   Mixed hyperlipidemia 07/16/2018   Bronchitis 01/20/2016   Idiopathic gout, left ankle and foot 01/20/2016   Menopausal and female climacteric states 10/20/2015   Obstructive sleep apnea 07/16/2014   Type 2 diabetes mellitus without complications (HCC) 11/17/2013   Edema 04/23/2011   Unspecified osteoarthritis, unspecified site 07/16/2002   Hypertension 07/16/1998    PCP: Sherron Monday, MD   REFERRING PROVIDER: Andrena Mews, DO   REFERRING DIAG:  Bilateral primary osteoarthritis of knee  M25.561 (ICD-10-CM) - Pain in right knee  R26.2 (ICD-10-CM) - Difficulty in walking, not elsewhere classified    THERAPY DIAG:  Chronic pain of right knee  Chronic pain of left knee  Other abnormalities of gait and mobility  Muscle weakness (generalized)  Rationale for Evaluation and Treatment: Rehabilitation  ONSET DATE: exacerbated last year  SUBJECTIVE:   SUBJECTIVE STATEMENT: Pt reports she was a little sore after last time, but not too bad. She reports L heel has  still been bothering her. Overall, has had benefit from PT. "I feel looser." 1/10 back pain, 5/10 knee/heel pain when walking. Compliant with HEP.      EVAL:[place] Had bad experience with right knee.  Trying to avoid L TKR. I am retired.  Cared for parents.  I am limited with walking a lot.  I used my cane down here use it as a precaution.  LB surgery in dec.  Sciatic pain in rle is gone. Right foot still has some numbness.  I think I could have walked another 100 ft before needing to sit. Use electric scooters in grocery stores although I would like to be  able to walk through them.  I am walking a lot better since back surgery.  Walked in sand on beach this summer and it really hurt, increased pain left knee.  Right knee no pain just stiffness   PERTINENT HISTORY: Lumbar spondylolisthesis and stenosis : 2 level decompression and TLIF/PLF 12/21/21 PAIN:  Are you having pain?no : NPRS scale: current 1/10- at rest in low back. 0/10 LE pain at rest. 5/10 pain in L knee and heel when walking Pain location: Low back and L knee Pain description:  Aggravating factors: walking, getting and out of car Relieving factors: sitting,lyingdown, tylenol  PRECAUTIONS: Fall  RED FLAGS: None   WEIGHT BEARING RESTRICTIONS: No  FALLS:  Has patient fallen in last 6 months? No  LIVING ENVIRONMENT: Lives with: lives alone Lives in: House/apartment Stairs: Yes: External: 4-5 steps; on left going up Has following equipment at home: Single point cane  OCCUPATION: retired  PLOF: Independent  PATIENT GOALS: Walk through grocery store, going to foot ball game, vacuuming/house chores    OBJECTIVE:    PATIENT SURVEYS:  FOTO risk adjusted 42% with goal of 61% 10/09/22: 57%  COGNITION: Overall cognitive status: Within functional limits for tasks assessed     SENSATION: Around right toes and heel residual numbness due to LB dyfunction.  Has improved since surgery    MUSCLE LENGTH: Hamstrings: Full knee extension   POSTURE: weight shift left and ue and shoulder guarding  PALPATION: L knee crepitus  LOWER EXTREMITY ROM:  Active ROM Right eval Left eval Left  9/17 Left 9/26  Hip flexion      Hip extension      Hip abduction      Hip adduction      Hip internal rotation      Hip external rotation      Knee flexion 112 92 92 100 seated scoot  Knee extension  -13 -17 -12 LAQ  Ankle dorsiflexion      Ankle plantarflexion      Ankle inversion      Ankle eversion       (Blank rows = not tested)  LOWER EXTREMITY MMT:  MMT  Right eval Left eval Lt    Hip flexion 30.4 24.2   Hip extension     Hip abduction 26.2 22.8   Hip adduction     Hip internal rotation     Hip external rotation     Knee flexion     Knee extension 33.0 22.8   Ankle dorsiflexion     Ankle plantarflexion     Ankle inversion     Ankle eversion      (Blank rows = not tested)   FUNCTIONAL TESTS:  Timed up and go (TUG): 21.58 Berg Balance Scale: 28/56 09/25/22: TUG : 18.96s without  cane 10/04/22: 20.37s without cane; 15.17s with cane  10/09/22: Berg 42/56  GAIT: Distance walked: 400 ft Assistive device utilized: Single point cane Level of assistance: Modified independence Comments: Shifted right, no left heel strike/ TKE left, antalgic limp   TODAY'S TREATMENT:         10/8:  -Piriformis stretch 30sec x2ea -LTR- 5" x10ea -DKTC with physioball (red) x10- 5sec hold -Supine march with TrA 2x10 -Sidelying clams 2x10ea Retro walking along rail x2 laps -Standing hip abd/ext 2x5ea  -Seated march 2x10 -LAQ 5" 2x10ea  -standing HSC 3x5ea -Standing gastroc stretch- 20sec x3 bil -HEP update (gastroc stretch and standing HSC)    10/3:  -Piriformis stretch -LTR -DKTC with physioball (red) x10 -Supine march with TrA 2x10 -Sidelying clams 2x10ea Retro walking along rail x2 laps -Standing hip abd/ext 2x5ea  -Seated march 2x10 -Standing tB row/ext with TRA RTB x10ea -HEP   Previous: Re-assessment Berg                                                                                                                         Pt seen for aquatic therapy today.  Treatment took place in water 3.5-4.75 ft in depth at the [facility] pool. Temp of water was 91.  Pt entered/exited the pool via stairs in step-to pattern with bilat rail.  *  walking forward/ backward- multiple lengths, side stepping R/L cues for upright trunk * side stepping with arm addct with rainbow hand floats * UE yellow HB: tandem gait forward/  backwards  *tandem stance leading R/L x 20s *SLS R/L ue support yellow HB *standing ue support yellow HB: df; pf; hip abdct/addct 2x5;  squats x 10; hip flex ext *Solid Noodle kick down hip in neutral position then externally rotated R/L x 10 ea    Pt requires the buoyancy and hydrostatic pressure of water for support, and to offload joints by unweighting joint load by at least 50 % in navel deep water and by at least 75-80% in chest to neck deep water.  Viscosity of the water is needed for resistance of strengthening. Water current perturbations provides challenge to standing balance requiring increased core activation.   PATIENT EDUCATION:  Education details: aquatic exercise progressions/modifications  Person educated: Patient Education method: Explanation Education comprehension: verbalized understanding  HOME EXERCISE PROGRAM: Access Code: [email or web address] URL: [email or web address] Date: 10/11/2022 Prepared by: Riki Altes  Exercises - Supine Lower Trunk Rotation  - 2 x daily - 7 x weekly - 2-3 sets - 10 reps - 5seconds hold - Hooklying Single Knee to Chest Stretch  - 2 x daily - 7 x weekly - 3 sets - 20seconds hold - Clamshell  - 2 x daily - 7 x weekly - 2-3 sets - 10 reps - Supine March  - 2 x daily - 7 x weekly - 3 sets - 10 reps  ASSESSMENT:  CLINICAL IMPRESSION: Pt with improved tolerance for plinth exercises today. No c/o pain with core strengthening exercises. Trialled  gastroc stretch on L LE with significant tightness felt compared to R LE. Pt to try this with HEP and see if heel pain improves. Difficulty with WB through L LE during standing knee flexion, though she denied significant pain. Will continue to monitor pain level as we aim to progress functional strength.   OBJECTIVE IMPAIRMENTS: Abnormal gait, decreased activity tolerance, decreased balance, decreased endurance, decreased mobility, difficulty walking, decreased ROM, decreased strength, improper  body mechanics, obesity, and pain.   ACTIVITY LIMITATIONS: carrying, lifting, bending, standing, squatting, sleeping, stairs, transfers, and locomotion level  PARTICIPATION LIMITATIONS: meal prep, cleaning, laundry, shopping, community activity, and yard work  PERSONAL FACTORS: Age, Fitness, and 1-2 comorbidities: DM and morbid obesity  are also affecting patient's functional outcome.   REHAB POTENTIAL: Good  CLINICAL DECISION MAKING: Evolving/moderate complexity  EVALUATION COMPLEXITY: Moderate   GOALS: Goals reviewed with patient? Yes  SHORT TERM GOALS: Target date: 09/18/22 Pt will tolerate full aquatic sessions consistently without increase in pain and with improving function to demonstrate good toleration and effectiveness of intervention.  Baseline: Goal status: Met  09/27/22  2.  Pt will improve on Tug test to <or=  17 to demonstrate improvement in lower extremity function, mobility and decreased fall risk. Baseline: 21.58at eval Goal status: Met - 10/04/22  3.  Pt will improve with transfers in and out of car with increase in pain Baseline:  Goal status: met - per her report- 10/04/22  4.  L knee flex to 100d and extension towards 0 for improved gait pattern Baseline: see chart Goal status: Partially met -  10/04/22  5.  Pt will report walking through grocery store pushing cart for up towards 25 minutes (personal goal) Baseline: electric cart at eval;  pushing cart at drug store for 10 min- 10/04/22 Goal status: In Progress. Met 10/09/22    LONG TERM GOALS: Target date: 10/16/22  Pt to meet stated Foto Goal 61% Baseline: risk adjusted 42% Goal status: INITIAL  2.  Pt will be indep with final HEP's (land and aquatic as appropriate) for continued management of condition Baseline:  Goal status: INITIAL  3.  Pt will improve strength in all areas listed by at least 10 lbs to demonstrate improved overall physical function  Baseline:  Goal status: INITIAL  4.  Pt will  improve on Berg balance test to >/=45/56 to demonstrate a decrease in fall risk. Baseline: 28/56; 42/56 Goal status: In progress 10/09/22  5.  Pt will report toleration to vacuuming and other household chores Baseline: avoids Goal status: INITIAL  6.  Pt will have a reduction in her worst pain to 4/10 or less to demonstrate improvement management of condition Baseline:  Goal status: INITIAL   PLAN:  PT FREQUENCY: 1-2x/week  PT DURATION: 4 weeks  PLANNED INTERVENTIONS: Therapeutic exercises, Therapeutic activity, Neuromuscular re-education, Balance training, Gait training, Patient/Family education, Self Care, Joint mobilization, Stair training, Orthotic/Fit training, DME instructions, Aquatic Therapy, Dry Needling, Electrical stimulation, Moist heat, Taping, Ionotophoresis 4mg /ml Dexamethasone, Manual therapy, and Re-evaluation  PLAN FOR NEXT SESSION: aquatic setting for core and LE strengthening, gait training/toleration, stair climbing  land based for loaded strengthening, Optometrist instruction, gait training and HEP  Riki Altes, PTA  10/16/22 2:59 PM [facility] [address] [place], [place], [address] Phone: [phone]   Fax:  [phone]

## 2022-10-17 ENCOUNTER — Encounter: Payer: Self-pay | Admitting: Internal Medicine

## 2022-10-18 ENCOUNTER — Other Ambulatory Visit: Payer: Self-pay | Admitting: Internal Medicine

## 2022-10-18 DIAGNOSIS — E119 Type 2 diabetes mellitus without complications: Secondary | ICD-10-CM

## 2022-10-19 ENCOUNTER — Other Ambulatory Visit: Payer: Self-pay | Admitting: Internal Medicine

## 2022-10-19 ENCOUNTER — Ambulatory Visit: Payer: PPO | Admitting: Internal Medicine

## 2022-10-19 DIAGNOSIS — R921 Mammographic calcification found on diagnostic imaging of breast: Secondary | ICD-10-CM

## 2022-10-19 DIAGNOSIS — Z9841 Cataract extraction status, right eye: Secondary | ICD-10-CM | POA: Diagnosis not present

## 2022-10-19 DIAGNOSIS — E113293 Type 2 diabetes mellitus with mild nonproliferative diabetic retinopathy without macular edema, bilateral: Secondary | ICD-10-CM | POA: Diagnosis not present

## 2022-10-19 DIAGNOSIS — Z9842 Cataract extraction status, left eye: Secondary | ICD-10-CM | POA: Diagnosis not present

## 2022-10-19 DIAGNOSIS — H52223 Regular astigmatism, bilateral: Secondary | ICD-10-CM | POA: Diagnosis not present

## 2022-10-23 ENCOUNTER — Ambulatory Visit (HOSPITAL_BASED_OUTPATIENT_CLINIC_OR_DEPARTMENT_OTHER): Payer: PPO

## 2022-10-23 ENCOUNTER — Encounter (HOSPITAL_BASED_OUTPATIENT_CLINIC_OR_DEPARTMENT_OTHER): Payer: Self-pay

## 2022-10-23 DIAGNOSIS — M6281 Muscle weakness (generalized): Secondary | ICD-10-CM

## 2022-10-23 DIAGNOSIS — G8929 Other chronic pain: Secondary | ICD-10-CM

## 2022-10-23 DIAGNOSIS — M25561 Pain in right knee: Secondary | ICD-10-CM | POA: Diagnosis not present

## 2022-10-23 DIAGNOSIS — R2689 Other abnormalities of gait and mobility: Secondary | ICD-10-CM

## 2022-10-23 NOTE — Therapy (Signed)
OUTPATIENT PHYSICAL THERAPY LOWER EXTREMITY TREATMENT      Patient Name: Kelly Farley MRN: [id number] DOB:Nov 14, 1955, 67 y.o., female Today's Date: 10/23/2022  END OF SESSION:  PT End of Session - 10/23/22 1213     Visit Number 11    Number of Visits 16    Date for PT Re-Evaluation 11/09/22    Progress Note Due on Visit 18    PT Start Time 1103    PT Stop Time 1145    PT Time Calculation (min) 42 min    Activity Tolerance Patient tolerated treatment well    Behavior During Therapy [facility] for tasks assessed/performed                  Past Medical History:  Diagnosis Date   Arthritis    Diabetes mellitus without complication (HCC)    Hyperlipemia    Hypertension    Other abnormal findings in urine 07/05/2021   Pain in right knee 02/14/2012   Palpitations    wore holter monitor 30 years ago; murmur no longer an issue per patient   Sleep apnea    Sweats, menopausal 10/20/2015   Past Surgical History:  Procedure Laterality Date   ABDOMINAL HYSTERECTOMY     total   BREAST BIOPSY Left 04/03/2022   stereo bx, calcs, RIBBON clip-path pending   BREAST BIOPSY Left 04/03/2022   MM LT BREAST BX W LOC DEV 1ST LESION IMAGE BX SPEC STEREO GUIDE 04/03/2022 [facility]   COLONOSCOPY WITH PROPOFOL N/A 08/18/2020   Procedure: COLONOSCOPY WITH PROPOFOL;  Surgeon: Pasty Spillers, MD;  Location: [facility];  Service: Endoscopy;  Laterality: N/A;   EYE SURGERY     JOINT REPLACEMENT  01/09/2012   right knee   TRANSFORAMINAL LUMBAR INTERBODY FUSION (TLIF) WITH PEDICLE SCREW FIXATION 2 LEVEL N/A 12/19/2021   Procedure: Lumbar Four- Five Lumbar Five-Sacral One Open laminectomy/Transforaminal Lumbar Interbody Fusion/Posterolateral instrumented fusion;  Surgeon: Jadene Pierini, MD;  Location: [facility];  Service: Neurosurgery;  Laterality: N/A;  RM 21/3C/TO FOLLOW   Patient Active Problem List   Diagnosis Date Noted   Morbid obesity with body mass index (BMI) of 40.0  or higher (HCC) 06/19/2022   Esophageal reflux 04/10/2022   Fusion of spine, lumbar region 04/10/2022   Spondylolisthesis of lumbar region 12/19/2021   Simple renal cyst 08/22/2021   Other obesity 07/05/2021   Stage 3b chronic kidney disease (HCC) 07/05/2021   Cecal polyp    Polyp of sigmoid colon    Chronic kidney disease (CKD) 07/16/2019   Mixed hyperlipidemia 07/16/2018   Bronchitis 01/20/2016   Idiopathic gout, left ankle and foot 01/20/2016   Menopausal and female climacteric states 10/20/2015   Obstructive sleep apnea 07/16/2014   Type 2 diabetes mellitus without complications (HCC) 11/17/2013   Edema 04/23/2011   Unspecified osteoarthritis, unspecified site 07/16/2002   Hypertension 07/16/1998    PCP: Sherron Monday, MD   REFERRING PROVIDER: Andrena Mews, DO   REFERRING DIAG:  Bilateral primary osteoarthritis of knee  M25.561 (ICD-10-CM) - Pain in right knee  R26.2 (ICD-10-CM) - Difficulty in walking, not elsewhere classified    THERAPY DIAG:  Chronic pain of right knee  Chronic pain of left knee  Other abnormalities of gait and mobility  Muscle weakness (generalized)  Rationale for Evaluation and Treatment: Rehabilitation  ONSET DATE: exacerbated last year  SUBJECTIVE:   SUBJECTIVE STATEMENT: Pt reports she is due for a L knee injection, so having some knee pain. Had increased pain  following last session in calf muscles. "I think it was that stretch we did." She reports this improving since last week, though still bothersome. 2/10 pain level at entry. Heel pain feels better today. "I'm going to get me some inserts today."      EVAL:[place] 10 years [place] Had bad experience with right knee.  Trying to avoid L TKR. I am retired.  Cared for parents.  I am limited with walking a lot.  I used my cane down here use it as a precaution.  LB surgery in dec.  Sciatic pain in rle is gone. Right foot still has some numbness.  I think I could have walked  another 100 ft before needing to sit. Use electric scooters in grocery stores although I would like to be able to walk through them.  I am walking a lot better since back surgery.  Walked in sand on beach this summer and it really hurt, increased pain left knee.  Right knee no pain just stiffness   PERTINENT HISTORY: Lumbar spondylolisthesis and stenosis : 2 level decompression and TLIF/PLF 12/21/21 PAIN:  Are you having pain?no : NPRS scale: current 1/10- at rest in low back. 0/10 LE pain at rest. 5/10 pain in L knee and heel when walking Pain location: Low back and L knee Pain description:  Aggravating factors: walking, getting and out of car Relieving factors: sitting,lyingdown, tylenol  PRECAUTIONS: Fall  RED FLAGS: None   WEIGHT BEARING RESTRICTIONS: No  FALLS:  Has patient fallen in last 6 months? No  LIVING ENVIRONMENT: Lives with: lives alone Lives in: House/apartment Stairs: Yes: External: 4-5 steps; on left going up Has following equipment at home: Single point cane  OCCUPATION: retired  PLOF: Independent  PATIENT GOALS: Walk through grocery store, going to foot ball game, vacuuming/house chores    OBJECTIVE:    PATIENT SURVEYS:  FOTO risk adjusted 42% with goal of 61% 10/09/22: 57%  COGNITION: Overall cognitive status: Within functional limits for tasks assessed     SENSATION: Around right toes and heel residual numbness due to LB dyfunction.  Has improved since surgery    MUSCLE LENGTH: Hamstrings: Full knee extension   POSTURE: weight shift left and ue and shoulder guarding  PALPATION: L knee crepitus  LOWER EXTREMITY ROM:  Active ROM Right eval Left eval Left  9/17 Left 9/26  Hip flexion      Hip extension      Hip abduction      Hip adduction      Hip internal rotation      Hip external rotation      Knee flexion 112 92 92 100 seated scoot  Knee extension  -13 -17 -12 LAQ  Ankle dorsiflexion      Ankle plantarflexion       Ankle inversion      Ankle eversion       (Blank rows = not tested)  LOWER EXTREMITY MMT:  MMT Right eval Left eval Lt    Hip flexion 30.4 24.2   Hip extension     Hip abduction 26.2 22.8   Hip adduction     Hip internal rotation     Hip external rotation     Knee flexion     Knee extension 33.0 22.8   Ankle dorsiflexion     Ankle plantarflexion     Ankle inversion     Ankle eversion      (Blank rows = not tested)  FUNCTIONAL TESTS:  Timed up and go (TUG): 21.58 Berg Balance Scale: 28/56 09/25/22: TUG : 18.96s without cane 10/04/22: 20.37s without cane; 15.17s with cane  10/09/22: Berg 42/56  GAIT: Distance walked: 400 ft Assistive device utilized: Single point cane Level of assistance: Modified independence Comments: Shifted right, no left heel strike/ TKE left, antalgic limp   TODAY'S TREATMENT:        10/15:  -LTR- 5" x10ea -DKTC with physioball (red) x10- 5sec hold -Supine march with TrA 2x10 -Sidelying clams 2x10ea -Bridges 2x10 Retro walking along rail x2 laps -Standing hip abd/ext 2x5ea  Standing march x20 (some heel pain) -Seated march 2x10 -LAQ 5" 2x10ea  -standing HSC 3x5ea    10/8:  -Piriformis stretch 30sec x2ea -LTR- 5" x10ea -DKTC with physioball (red) x10- 5sec hold -Supine march with TrA 2x10 -Sidelying clams 2x10ea Retro walking along rail x2 laps -Standing hip abd/ext 2x5ea  -Seated march 2x10 -LAQ 5" 2x10ea  -standing HSC 3x5ea -Standing gastroc stretch- 20sec x3 bil -HEP update (gastroc stretch and standing HSC)    10/3:  -Piriformis stretch -LTR -DKTC with physioball (red) x10 -Supine march with TrA 2x10 -Sidelying clams 2x10ea Retro walking along rail x2 laps -Standing hip abd/ext 2x5ea  -Seated march 2x10 -Standing tB row/ext with TRA RTB x10ea -HEP   Previous: Re-assessment Berg                                                                                                                         Pt  seen for aquatic therapy today.  Treatment took place in water 3.5-4.75 ft in depth at the [facility] pool. Temp of water was 91.  Pt entered/exited the pool via stairs in step-to pattern with bilat rail.  *  walking forward/ backward- multiple lengths, side stepping R/L cues for upright trunk * side stepping with arm addct with rainbow hand floats * UE yellow HB: tandem gait forward/ backwards  *tandem stance leading R/L x 20s *SLS R/L ue support yellow HB *standing ue support yellow HB: df; pf; hip abdct/addct 2x5;  squats x 10; hip flex ext *Solid Noodle kick down hip in neutral position then externally rotated R/L x 10 ea    Pt requires the buoyancy and hydrostatic pressure of water for support, and to offload joints by unweighting joint load by at least 50 % in navel deep water and by at least 75-80% in chest to neck deep water.  Viscosity of the water is needed for resistance of strengthening. Water current perturbations provides challenge to standing balance requiring increased core activation.   PATIENT EDUCATION:  Education details: aquatic exercise progressions/modifications  Person educated: Patient Education method: Explanation Education comprehension: verbalized understanding  HOME EXERCISE PROGRAM: Access Code: [email or web address] URL: [email or web address] Date: 10/11/2022 Prepared by: Riki Altes  Exercises - Supine Lower Trunk Rotation  - 2 x daily - 7 x weekly - 2-3 sets - 10 reps - 5seconds hold - Hooklying Single Knee to Chest Stretch  -  2 x daily - 7 x weekly - 3 sets - 20seconds hold - Clamshell  - 2 x daily - 7 x weekly - 2-3 sets - 10 reps - Supine March  - 2 x daily - 7 x weekly - 3 sets - 10 reps  ASSESSMENT:  CLINICAL IMPRESSION: Pt instructed to hold on gastroc stretch for now at home. May re-visit this once pain level decreases. Pt able to perform bridges today, though weakness evident. Pt challenged by standing tasks due to poor  standing tolerance from strength deficits. She fatigued quickly in lateral glutes with s/l clams and standing hip abduction.    OBJECTIVE IMPAIRMENTS: Abnormal gait, decreased activity tolerance, decreased balance, decreased endurance, decreased mobility, difficulty walking, decreased ROM, decreased strength, improper body mechanics, obesity, and pain.   ACTIVITY LIMITATIONS: carrying, lifting, bending, standing, squatting, sleeping, stairs, transfers, and locomotion level  PARTICIPATION LIMITATIONS: meal prep, cleaning, laundry, shopping, community activity, and yard work  PERSONAL FACTORS: Age, Fitness, and 1-2 comorbidities: DM and morbid obesity  are also affecting patient's functional outcome.   REHAB POTENTIAL: Good  CLINICAL DECISION MAKING: Evolving/moderate complexity  EVALUATION COMPLEXITY: Moderate   GOALS: Goals reviewed with patient? Yes  SHORT TERM GOALS: Target date: 09/18/22 Pt will tolerate full aquatic sessions consistently without increase in pain and with improving function to demonstrate good toleration and effectiveness of intervention.  Baseline: Goal status: Met  09/27/22  2.  Pt will improve on Tug test to <or=  17 to demonstrate improvement in lower extremity function, mobility and decreased fall risk. Baseline: 21.58at eval Goal status: Met - 10/04/22  3.  Pt will improve with transfers in and out of car with increase in pain Baseline:  Goal status: met - per her report- 10/04/22  4.  L knee flex to 100d and extension towards 0 for improved gait pattern Baseline: see chart Goal status: Partially met -  10/04/22  5.  Pt will report walking through grocery store pushing cart for up towards 25 minutes (personal goal) Baseline: electric cart at eval;  pushing cart at drug store for 10 min- 10/04/22 Goal status: In Progress. Met 10/09/22    LONG TERM GOALS: Target date: 10/16/22  Pt to meet stated Foto Goal 61% Baseline: risk adjusted 42% Goal status:  INITIAL  2.  Pt will be indep with final HEP's (land and aquatic as appropriate) for continued management of condition Baseline:  Goal status: INITIAL  3.  Pt will improve strength in all areas listed by at least 10 lbs to demonstrate improved overall physical function  Baseline:  Goal status: INITIAL  4.  Pt will improve on Berg balance test to >/=45/56 to demonstrate a decrease in fall risk. Baseline: 28/56; 42/56 Goal status: In progress 10/09/22  5.  Pt will report toleration to vacuuming and other household chores Baseline: avoids Goal status: INITIAL  6.  Pt will have a reduction in her worst pain to 4/10 or less to demonstrate improvement management of condition Baseline:  Goal status: INITIAL   PLAN:  PT FREQUENCY: 1-2x/week  PT DURATION: 4 weeks  PLANNED INTERVENTIONS: Therapeutic exercises, Therapeutic activity, Neuromuscular re-education, Balance training, Gait training, Patient/Family education, Self Care, Joint mobilization, Stair training, Orthotic/Fit training, DME instructions, Aquatic Therapy, Dry Needling, Electrical stimulation, Moist heat, Taping, Ionotophoresis 4mg /ml Dexamethasone, Manual therapy, and Re-evaluation  PLAN FOR NEXT SESSION: aquatic setting for core and LE strengthening, gait training/toleration, stair climbing  land based for loaded strengthening, body mechanic instruction, gait training and HEP  Riki Altes, PTA  10/23/22 12:14 PM [facility] [address] [place], [place], [address] Phone: [phone]   Fax:  [phone]

## 2022-10-24 ENCOUNTER — Encounter: Payer: Self-pay | Admitting: Internal Medicine

## 2022-10-24 ENCOUNTER — Ambulatory Visit: Payer: PPO | Admitting: Internal Medicine

## 2022-10-24 VITALS — BP 110/76 | HR 65 | Ht 67.5 in | Wt 268.4 lb

## 2022-10-24 DIAGNOSIS — I10 Essential (primary) hypertension: Secondary | ICD-10-CM

## 2022-10-24 DIAGNOSIS — M1A9XX0 Chronic gout, unspecified, without tophus (tophi): Secondary | ICD-10-CM

## 2022-10-24 DIAGNOSIS — E119 Type 2 diabetes mellitus without complications: Secondary | ICD-10-CM | POA: Diagnosis not present

## 2022-10-24 DIAGNOSIS — Z0001 Encounter for general adult medical examination with abnormal findings: Secondary | ICD-10-CM

## 2022-10-24 DIAGNOSIS — Z1331 Encounter for screening for depression: Secondary | ICD-10-CM

## 2022-10-24 DIAGNOSIS — N1832 Chronic kidney disease, stage 3b: Secondary | ICD-10-CM

## 2022-10-24 LAB — POCT CBG (FASTING - GLUCOSE)-MANUAL ENTRY: Glucose Fasting, POC: 125 mg/dL — AB (ref 70–99)

## 2022-10-24 MED ORDER — ALLOPURINOL 100 MG PO TABS
100.0000 mg | ORAL_TABLET | Freq: Every day | ORAL | 1 refills | Status: DC
Start: 2022-10-24 — End: 2023-06-05

## 2022-10-24 MED ORDER — RYBELSUS 14 MG PO TABS
14.0000 mg | ORAL_TABLET | Freq: Every day | ORAL | 0 refills | Status: AC
Start: 2022-10-24 — End: 2023-01-22

## 2022-10-24 MED ORDER — HYDROCHLOROTHIAZIDE 25 MG PO TABS
25.0000 mg | ORAL_TABLET | Freq: Every day | ORAL | 1 refills | Status: DC
Start: 2022-10-24 — End: 2023-06-05

## 2022-10-24 MED ORDER — OLMESARTAN MEDOXOMIL 40 MG PO TABS
40.0000 mg | ORAL_TABLET | Freq: Every day | ORAL | 1 refills | Status: DC
Start: 2022-10-24 — End: 2023-06-05

## 2022-10-24 NOTE — Progress Notes (Signed)
Established Patient Office Visit  Subjective:  Patient ID: Kelly Farley, female    DOB: 09/06/1955  Age: 67 y.o. MRN: [id number]  Chief Complaint  Patient presents with   Annual Exam    AWV, discuss lab results.    No new complaints, here for AWV refer to quality metrics and scanned documents.   Labs reviewed and notable for well controlled diabetes, A1c at target, lipids also at target but cmp notable for stable renal function. Denies any hypoglycemic episodes and home bg readings have been at target. Flare of plantar fasciitis for which she'Kelly Farley currently undergoing PT.     No other concerns at this time.   Past Medical History:  Diagnosis Date   Arthritis    Diabetes mellitus without complication (HCC)    Hyperlipemia    Hypertension    Other abnormal findings in urine 07/05/2021   Pain in right knee 02/14/2012   Palpitations    wore holter monitor 30 years ago; murmur no longer an issue per patient   Sleep apnea    Sweats, menopausal 10/20/2015    Past Surgical History:  Procedure Laterality Date   ABDOMINAL HYSTERECTOMY     total   BREAST BIOPSY Left 04/03/2022   stereo bx, calcs, RIBBON clip-path pending   BREAST BIOPSY Left 04/03/2022   MM LT BREAST BX W LOC DEV 1ST LESION IMAGE BX SPEC STEREO GUIDE 04/03/2022 [facility]   COLONOSCOPY WITH PROPOFOL N/A 08/18/2020   Procedure: COLONOSCOPY WITH PROPOFOL;  Surgeon: Kelly Spillers, MD;  Location: [facility];  Service: Endoscopy;  Laterality: N/A;   EYE SURGERY     JOINT REPLACEMENT  01/09/2012   right knee   TRANSFORAMINAL LUMBAR INTERBODY FUSION (TLIF) WITH PEDICLE SCREW FIXATION 2 LEVEL N/A 12/19/2021   Procedure: Lumbar Four- Five Lumbar Five-Sacral One Open laminectomy/Transforaminal Lumbar Interbody Fusion/Posterolateral instrumented fusion;  Surgeon: Kelly Pierini, MD;  Location: [facility];  Service: Neurosurgery;  Laterality: N/A;  RM 21/3C/TO FOLLOW    Social History   Socioeconomic  History   Marital status: Single    Spouse name: Not on file   Number of children: Not on file   Years of education: Not on file   Highest education level: Not on file  Occupational History   Not on file  Tobacco Use   Smoking status: Never   Smokeless tobacco: Not on file  Vaping Use   Vaping status: Never Used  Substance and Sexual Activity   Alcohol use: No   Drug use: Never   Sexual activity: Not on file  Other Topics Concern   Not on file  Social History Narrative   Not on file   Social Determinants of Health   Financial Resource Strain: Not on file  Food Insecurity: Not on file  Transportation Needs: Not on file  Physical Activity: Not on file  Stress: Not on file  Social Connections: Not on file  Intimate Partner Violence: Not on file    Family History  Problem Relation Age of Onset   Colon cancer Mother    Breast cancer Maternal Aunt     Allergies  Allergen Reactions   Soy Allergy Hives and Swelling    Throat swelling    Peanut-Containing Drug Products Swelling and Rash    Review of Systems  Constitutional:  Positive for weight loss (7 lbs).       With diet and exercise  HENT: Negative.    Eyes: Negative.   Respiratory: Negative.  Cardiovascular: Negative.   Gastrointestinal: Negative.   Genitourinary: Negative.   Musculoskeletal:        Mild  Skin: Negative.   Neurological: Negative.        Numbness in right leg which is improving  Endo/Heme/Allergies: Negative.        Objective:   BP 110/76   Pulse 65   Ht 5' 7.5" (1.715 m)   Wt 268 lb 6.4 oz (121.7 kg)   SpO2 97%   BMI 41.42 kg/m   Vitals:   10/24/22 1012  BP: 110/76  Pulse: 65  Height: 5' 7.5" (1.715 m)  Weight: 268 lb 6.4 oz (121.7 kg)  SpO2: 97%  BMI (Calculated): 41.39    Physical Exam Vitals reviewed.  Constitutional:      General: She is not in acute distress.    Appearance: She is obese.  HENT:     Head: Normocephalic.     Nose: Nose normal.      Mouth/Throat:     Mouth: Mucous membranes are moist.  Eyes:     Extraocular Movements: Extraocular movements intact.     Pupils: Pupils are equal, round, and reactive to light.  Cardiovascular:     Rate and Rhythm: Normal rate and regular rhythm.     Heart sounds: No murmur heard. Pulmonary:     Effort: Pulmonary effort is normal.     Breath sounds: No rhonchi or rales.  Abdominal:     General: Abdomen is flat.     Palpations: There is no hepatomegaly, splenomegaly or mass.  Musculoskeletal:        General: Normal range of motion.     Cervical back: Normal range of motion. No tenderness.  Skin:    General: Skin is warm and dry.  Neurological:     General: No focal deficit present.     Mental Status: She is alert and oriented to person, place, and time.     Cranial Nerves: No cranial nerve deficit.     Motor: No weakness.  Psychiatric:        Mood and Affect: Mood normal.        Behavior: Behavior normal.      Results for orders placed or performed in visit on 10/24/22  POCT CBG (Fasting - Glucose)  Result Value Ref Range   Glucose Fasting, POC 125 (A) 70 - 99 mg/dL    Recent Results (from the past 2160 hour(Kelly Farley))  CK     Status: None   Collection Time: 10/04/22 10:12 AM  Result Value Ref Range   Total CK 118 32 - 182 U/L  Comprehensive metabolic panel     Status: Abnormal   Collection Time: 10/04/22 10:12 AM  Result Value Ref Range   Glucose 108 (H) 70 - 99 mg/dL   BUN 19 8 - 27 mg/dL   Creatinine, Ser 1.61 (H) 0.57 - 1.00 mg/dL   eGFR 48 (L) >09 UE/AVW/0.98   BUN/Creatinine Ratio 15 12 - 28   Sodium 141 134 - 144 mmol/L   Potassium 4.0 3.5 - 5.2 mmol/L   Chloride 100 96 - 106 mmol/L   CO2 24 20 - 29 mmol/L   Calcium 10.0 8.7 - 10.3 mg/dL   Total Protein 6.7 6.0 - 8.5 g/dL   Albumin 4.0 3.9 - 4.9 g/dL   Globulin, Total 2.7 1.5 - 4.5 g/dL   Bilirubin Total 0.4 0.0 - 1.2 mg/dL   Alkaline Phosphatase 82 44 - 121 IU/L   AST 30  0 - 40 IU/L   ALT 15 0 - 32 IU/L   Lipid panel     Status: None   Collection Time: 10/04/22 10:12 AM  Result Value Ref Range   Cholesterol, Total 147 100 - 199 mg/dL   Triglycerides 78 0 - 149 mg/dL   HDL 56 >16 mg/dL   VLDL Cholesterol Cal 15 5 - 40 mg/dL   LDL Chol Calc (NIH) 76 0 - 99 mg/dL   Chol/HDL Ratio 2.6 0.0 - 4.4 ratio    Comment:                                   T. Chol/HDL Ratio                                             Men  Women                               1/2 Avg.Risk  3.4    3.3                                   Avg.Risk  5.0    4.4                                2X Avg.Risk  9.6    7.1                                3X Avg.Risk 23.4   11.0   Hemoglobin A1c     Status: None   Collection Time: 10/04/22 10:12 AM  Result Value Ref Range   Hgb A1c MFr Bld 5.5 4.8 - 5.6 %    Comment:          Prediabetes: 5.7 - 6.4          Diabetes: >6.4          Glycemic control for adults with diabetes: <7.0    Est. average glucose Bld gHb Est-mCnc 111 mg/dL  POCT CBG (Fasting - Glucose)     Status: Abnormal   Collection Time: 10/24/22 10:18 AM  Result Value Ref Range   Glucose Fasting, POC 125 (A) 70 - 99 mg/dL      Assessment & Plan:  As per problem list  Problem List Items Addressed This Visit       Cardiovascular and Mediastinum   Hypertension   Relevant Medications   hydrochlorothiazide (HYDRODIURIL) 25 MG tablet   olmesartan (BENICAR) 40 MG tablet     Genitourinary   Stage 3b chronic kidney disease (HCC)     Other   Morbid obesity with body mass index (BMI) of 40.0 or higher (HCC)   Relevant Medications   Semaglutide (RYBELSUS) 14 MG TABS   Other Relevant Orders   DG Bone Density   Other Visit Diagnoses     Controlled type 2 diabetes mellitus without complication, without long-term current use of insulin (HCC)    -  Primary   Relevant Medications   olmesartan (BENICAR) 40 MG tablet   Semaglutide (RYBELSUS)  14 MG TABS   Other Relevant Orders   POCT CBG (Fasting - Glucose)  (Completed)   Gout, unspecified       Relevant Medications   allopurinol (ZYLOPRIM) 100 MG tablet       Return in about 3 months (around 01/24/2023) for fu with labs prior.   Total time spent: 20 minutes  Luna Fuse, MD  10/24/2022   This document may have been prepared by [facility] Voice Recognition software and as such may include unintentional dictation errors.

## 2022-10-26 ENCOUNTER — Ambulatory Visit (HOSPITAL_BASED_OUTPATIENT_CLINIC_OR_DEPARTMENT_OTHER): Payer: PPO | Admitting: Physical Therapy

## 2022-10-26 ENCOUNTER — Encounter (HOSPITAL_BASED_OUTPATIENT_CLINIC_OR_DEPARTMENT_OTHER): Payer: Self-pay | Admitting: Physical Therapy

## 2022-10-26 DIAGNOSIS — M25561 Pain in right knee: Secondary | ICD-10-CM | POA: Diagnosis not present

## 2022-10-26 DIAGNOSIS — R2689 Other abnormalities of gait and mobility: Secondary | ICD-10-CM

## 2022-10-26 DIAGNOSIS — G8929 Other chronic pain: Secondary | ICD-10-CM

## 2022-10-26 DIAGNOSIS — M6281 Muscle weakness (generalized): Secondary | ICD-10-CM

## 2022-10-26 NOTE — Therapy (Signed)
OUTPATIENT PHYSICAL THERAPY LOWER EXTREMITY TREATMENT   Patient Name: Kelly Farley MRN: [id number] DOB:[birth year], 67 y.o., female Today's Date: 10/26/2022  END OF SESSION:  PT End of Session - 10/26/22 1030     Visit Number 12    Number of Visits 16    Date for PT Re-Evaluation 11/09/22    Progress Note Due on Visit 18    PT Start Time 1029    PT Stop Time 1110    PT Time Calculation (min) 41 min    Behavior During Therapy [facility] ([facility]) for tasks assessed/performed                  Past Medical History:  Diagnosis Date   Arthritis    Diabetes mellitus without complication (HCC)    Hyperlipemia    Hypertension    Other abnormal findings in urine 07/05/2021   Pain in right knee 02/14/2012   Palpitations    wore holter monitor 30 years ago; murmur no longer an issue per patient   Sleep apnea    Sweats, menopausal 10/20/2015   Past Surgical History:  Procedure Laterality Date   ABDOMINAL HYSTERECTOMY     total   BREAST BIOPSY Left 04/03/2022   stereo bx, calcs, RIBBON clip-path pending   BREAST BIOPSY Left 04/03/2022   MM LT BREAST BX W LOC DEV 1ST LESION IMAGE BX SPEC STEREO GUIDE 04/03/2022 [facility]   COLONOSCOPY WITH PROPOFOL N/A 08/18/2020   Procedure: COLONOSCOPY WITH PROPOFOL;  Surgeon: Pasty Spillers, MD;  Location: [facility] ENDOSCOPY;  Service: Endoscopy;  Laterality: N/A;   EYE SURGERY     JOINT REPLACEMENT  01/09/2012   right knee   TRANSFORAMINAL LUMBAR INTERBODY FUSION (TLIF) WITH PEDICLE SCREW FIXATION 2 LEVEL N/A 12/19/2021   Procedure: Lumbar Four- Five Lumbar Five-Sacral One Open laminectomy/Transforaminal Lumbar Interbody Fusion/Posterolateral instrumented fusion;  Surgeon: Jadene Pierini, MD;  Location: [facility] OR;  Service: Neurosurgery;  Laterality: N/A;  RM 21/3C/TO FOLLOW   Patient Active Problem List   Diagnosis Date Noted   Morbid obesity with body mass index (BMI) of 40.0 or higher (HCC) 06/19/2022   Esophageal reflux 04/10/2022    Fusion of spine, lumbar region 04/10/2022   Spondylolisthesis of lumbar region 12/19/2021   Simple renal cyst 08/22/2021   Other obesity 07/05/2021   Stage 3b chronic kidney disease (HCC) 07/05/2021   Cecal polyp    Polyp of sigmoid colon    Chronic kidney disease (CKD) 07/16/2019   Mixed hyperlipidemia 07/16/2018   Bronchitis 01/20/2016   Idiopathic gout, left ankle and foot 01/20/2016   Menopausal and female climacteric states 10/20/2015   Obstructive sleep apnea 07/16/2014   Type 2 diabetes mellitus without complications (HCC) 11/17/2013   Edema 04/23/2011   Unspecified osteoarthritis, unspecified site 07/16/2002   Hypertension 07/16/1998    PCP: Sherron Monday, MD   REFERRING PROVIDER: Andrena Mews, DO   REFERRING DIAG:  Bilateral primary osteoarthritis of knee  M25.561 (ICD-10-CM) - Pain in right knee  R26.2 (ICD-10-CM) - Difficulty in walking, not elsewhere classified    THERAPY DIAG:  Chronic pain of right knee  Chronic pain of left knee  Other abnormalities of gait and mobility  Muscle weakness (generalized)  Rationale for Evaluation and Treatment: Rehabilitation  ONSET DATE: exacerbated last year  SUBJECTIVE:   SUBJECTIVE STATEMENT: "I've been in pain".  Pt reports Lt neck pain since last land appt.    ACCESS TO POOL:  considering [employer].    EVAL:[place] TKR x  10 years in [place] Had bad experience with right knee.  Trying to avoid L TKR. I am retired.  Cared for parents.  I am limited with walking a lot.  I used my cane down here use it as a precaution.  LB surgery in dec.  Sciatic pain in rle is gone. Right foot still has some numbness.  I think I could have walked another 100 ft before needing to sit. Use electric scooters in grocery stores although I would like to be able to walk through them.  I am walking a lot better since back surgery.  Walked in sand on beach this summer and it really hurt, increased pain left knee.  Right knee no pain just  stiffness   PERTINENT HISTORY: Lumbar spondylolisthesis and stenosis : 2 level decompression and TLIF/PLF 12/21/21 PAIN:  Are you having pain?no : NPRS scale: 2/10  Pain location: L neck, Lt heel  Pain description:  Aggravating factors: walking, getting and out of car Relieving factors: sitting,lyingdown, tylenol  PRECAUTIONS: Fall  RED FLAGS: None   WEIGHT BEARING RESTRICTIONS: No  FALLS:  Has patient fallen in last 6 months? No  LIVING ENVIRONMENT: Lives with: lives alone Lives in: House/apartment Stairs: Yes: External: 4-5 steps; on left going up Has following equipment at home: Single point cane  OCCUPATION: retired  PLOF: Independent  PATIENT GOALS: Walk through grocery store, going to foot ball game, vacuuming/house chores    OBJECTIVE:    PATIENT SURVEYS:  FOTO risk adjusted 42% with goal of 61% 10/09/22: 57%  COGNITION: Overall cognitive status: Within functional limits for tasks assessed     SENSATION: Around right toes and heel residual numbness due to LB dyfunction.  Has improved since surgery    MUSCLE LENGTH: Hamstrings: Full knee extension   POSTURE: weight shift left and ue and shoulder guarding  PALPATION: L knee crepitus  LOWER EXTREMITY ROM:  Active ROM Right eval Left eval Left  9/17 Left 9/26  Hip flexion      Hip extension      Hip abduction      Hip adduction      Hip internal rotation      Hip external rotation      Knee flexion 112 92 92 100 seated scoot  Knee extension  -13 -17 -12 LAQ  Ankle dorsiflexion      Ankle plantarflexion      Ankle inversion      Ankle eversion       (Blank rows = not tested)  LOWER EXTREMITY MMT:  MMT Right eval Left eval Lt    Hip flexion 30.4 24.2   Hip extension     Hip abduction 26.2 22.8   Hip adduction     Hip internal rotation     Hip external rotation     Knee flexion     Knee extension 33.0 22.8   Ankle dorsiflexion     Ankle plantarflexion     Ankle  inversion     Ankle eversion      (Blank rows = not tested)   FUNCTIONAL TESTS:  Timed up and go (TUG): 21.58 Berg Balance Scale: 28/56 09/25/22: TUG : 18.96s without cane 10/04/22: 20.37s without cane; 15.17s with cane  10/09/22: Berg 42/56  GAIT: Distance walked: 400 ft Assistive device utilized: Single point cane Level of assistance: Modified independence Comments: Shifted right, no left heel strike/ TKE left, antalgic limp   TODAY'S TREATMENT:       10/18:  Pt seen for aquatic therapy today.  Treatment took place in water 3.5-4.75 ft in depth at the [facility] pool. Temp of water was 91.  Pt entered/exited the pool via stairs in step-to pattern with bilat rail.  *  walking forward/ backward- multiple lengths, side stepping R/L cues for upright trunk * side stepping with arm addct with rainbow hand floats * holding wall:  single leg clam, alternating LEs x 10 each; hip abdct/ addct with cues for toe pointing forward x 10 each LE; leg swings into hip flex/ ext  x 1- each * UE yellow hand floats -> no support: tandem gait forward/ backwards (challenge); hip abdct/ addct x 10 * Seated on bench in water with blue step under feet - cycling, STS with eccentric lowering x 8 -cues for even weight between feet   10/15:  -LTR- 5" x10ea -DKTC with physioball (red) x10- 5sec hold -Supine march with TrA 2x10 -Sidelying clams 2x10ea -Bridges 2x10 Retro walking along rail x2 laps -Standing hip abd/ext 2x5ea  Standing march x20 (some heel pain) -Seated march 2x10 -LAQ 5" 2x10ea  -standing HSC 3x5ea  10/8:  -Piriformis stretch 30sec x2ea -LTR- 5" x10ea -DKTC with physioball (red) x10- 5sec hold -Supine march with TrA 2x10 -Sidelying clams 2x10ea Retro walking along rail x2 laps -Standing hip abd/ext 2x5ea  -Seated march 2x10 -LAQ 5" 2x10ea  -standing HSC 3x5ea -Standing gastroc stretch- 20sec x3 bil -HEP update (gastroc stretch and standing  HSC)  10/3:  -Piriformis stretch -LTR -DKTC with physioball (red) x10 -Supine march with TrA 2x10 -Sidelying clams 2x10ea Retro walking along rail x2 laps -Standing hip abd/ext 2x5ea  -Seated march 2x10 -Standing tB row/ext with TRA RTB x10ea -HEP   PATIENT EDUCATION:  Education details: aquatic exercise progressions/modifications  Person educated: Patient Education method: Explanation Education comprehension: verbalized understanding  HOME EXERCISE PROGRAM: Access Code: [email or web address] URL: [email or web address] Date: 10/11/2022 Prepared by: Riki Altes  Exercises - Supine Lower Trunk Rotation  - 2 x daily - 7 x weekly - 2-3 sets - 10 reps - 5seconds hold - Hooklying Single Knee to Chest Stretch  - 2 x daily - 7 x weekly - 3 sets - 20seconds hold - Clamshell  - 2 x daily - 7 x weekly - 2-3 sets - 10 reps - Supine March  - 2 x daily - 7 x weekly - 3 sets - 10 reps  ASSESSMENT:  CLINICAL IMPRESSION: Pt reported some pain in Lt medial knee with single leg clam. She requires frequent cues for more upright posture during session.  Overall tolerated aquatic exercises well.  Plan to create and issue aquatic HEP in upcoming visit. Progressing towards remaining goals.    OBJECTIVE IMPAIRMENTS: Abnormal gait, decreased activity tolerance, decreased balance, decreased endurance, decreased mobility, difficulty walking, decreased ROM, decreased strength, improper body mechanics, obesity, and pain.   ACTIVITY LIMITATIONS: carrying, lifting, bending, standing, squatting, sleeping, stairs, transfers, and locomotion level  PARTICIPATION LIMITATIONS: meal prep, cleaning, laundry, shopping, community activity, and yard work  PERSONAL FACTORS: Age, Fitness, and 1-2 comorbidities: DM and morbid obesity  are also affecting patient's functional outcome.   REHAB POTENTIAL: Good  CLINICAL DECISION MAKING: Evolving/moderate complexity  EVALUATION COMPLEXITY:  Moderate   GOALS: Goals reviewed with patient? Yes  SHORT TERM GOALS: Target date: 09/18/22 Pt will tolerate full aquatic sessions consistently without increase in pain and with improving function to demonstrate good toleration and effectiveness of intervention.  Baseline: Goal status: Met  09/27/22  2.  Pt  will improve on Tug test to <or=  17 to demonstrate improvement in lower extremity function, mobility and decreased fall risk. Baseline: 21.58at eval Goal status: Met - 10/04/22  3.  Pt will improve with transfers in and out of car with increase in pain Baseline:  Goal status: met - per her report- 10/04/22  4.  L knee flex to 100d and extension towards 0 for improved gait pattern Baseline: see chart Goal status: Partially met -  10/04/22  5.  Pt will report walking through grocery store pushing cart for up towards 25 minutes (personal goal) Baseline: electric cart at eval;  pushing cart at drug store for 10 min- 10/04/22 Goal status: In Progress. Met 10/09/22    LONG TERM GOALS: Target date: 11/09/22  Pt to meet stated Foto Goal 61% Baseline: risk adjusted 42% Goal status: INITIAL  2.  Pt will be indep with final HEP's (land and aquatic as appropriate) for continued management of condition Baseline:  Goal status: INITIAL  3.  Pt will improve strength in all areas listed by at least 10 lbs to demonstrate improved overall physical function  Baseline:  Goal status: INITIAL  4.  Pt will improve on Berg balance test to >/=45/56 to demonstrate a decrease in fall risk. Baseline: 28/56; 42/56 Goal status: In progress 10/09/22  5.  Pt will report toleration to vacuuming and other household chores Baseline: 20-30% improvement  Goal status: In progress - 10/26/22  6.  Pt will have a reduction in her worst pain to 4/10 or less to demonstrate improvement management of condition Baseline:  Goal status: INITIAL   PLAN:  PT FREQUENCY: 1-2x/week  PT DURATION: 4 weeks  PLANNED  INTERVENTIONS: Therapeutic exercises, Therapeutic activity, Neuromuscular re-education, Balance training, Gait training, Patient/Family education, Self Care, Joint mobilization, Stair training, Orthotic/Fit training, DME instructions, Aquatic Therapy, Dry Needling, Electrical stimulation, Moist heat, Taping, Ionotophoresis 4mg /ml Dexamethasone, Manual therapy, and Re-evaluation  PLAN FOR NEXT SESSION: aquatic setting for core and LE strengthening, gait training/toleration, stair climbing  land based for loaded strengthening, Optometrist instruction, gait training and HEP  Mayer Camel, PTA 10/26/22 12:51 PM [facility] [address] [place], [place], [address] Phone: [phone]   Fax:  [phone]

## 2022-10-30 ENCOUNTER — Ambulatory Visit (HOSPITAL_BASED_OUTPATIENT_CLINIC_OR_DEPARTMENT_OTHER): Payer: Self-pay | Admitting: Physical Therapy

## 2022-10-30 DIAGNOSIS — G4733 Obstructive sleep apnea (adult) (pediatric): Secondary | ICD-10-CM | POA: Diagnosis not present

## 2022-10-31 ENCOUNTER — Ambulatory Visit
Admission: RE | Admit: 2022-10-31 | Discharge: 2022-10-31 | Disposition: A | Discharge status: Home / Self Care | Payer: PPO | Source: Ambulatory Visit | Attending: Internal Medicine | Admitting: Internal Medicine

## 2022-10-31 DIAGNOSIS — Z9289 Personal history of other medical treatment: Secondary | ICD-10-CM | POA: Diagnosis not present

## 2022-10-31 DIAGNOSIS — R921 Mammographic calcification found on diagnostic imaging of breast: Secondary | ICD-10-CM | POA: Diagnosis not present

## 2022-10-31 DIAGNOSIS — R92322 Mammographic fibroglandular density, left breast: Secondary | ICD-10-CM | POA: Diagnosis not present

## 2022-11-05 DIAGNOSIS — R262 Difficulty in walking, not elsewhere classified: Secondary | ICD-10-CM | POA: Diagnosis not present

## 2022-11-05 DIAGNOSIS — M25562 Pain in left knee: Secondary | ICD-10-CM | POA: Diagnosis not present

## 2022-11-05 DIAGNOSIS — M25462 Effusion, left knee: Secondary | ICD-10-CM | POA: Diagnosis not present

## 2022-11-05 DIAGNOSIS — M170 Bilateral primary osteoarthritis of knee: Secondary | ICD-10-CM | POA: Diagnosis not present

## 2022-11-05 DIAGNOSIS — S86012A Strain of left Achilles tendon, initial encounter: Secondary | ICD-10-CM | POA: Diagnosis not present

## 2022-11-06 ENCOUNTER — Encounter (HOSPITAL_BASED_OUTPATIENT_CLINIC_OR_DEPARTMENT_OTHER): Payer: Self-pay | Admitting: Physical Therapy

## 2022-11-06 ENCOUNTER — Ambulatory Visit (HOSPITAL_BASED_OUTPATIENT_CLINIC_OR_DEPARTMENT_OTHER): Payer: PPO | Admitting: Physical Therapy

## 2022-11-06 DIAGNOSIS — G8929 Other chronic pain: Secondary | ICD-10-CM

## 2022-11-06 DIAGNOSIS — R2689 Other abnormalities of gait and mobility: Secondary | ICD-10-CM

## 2022-11-06 DIAGNOSIS — M6281 Muscle weakness (generalized): Secondary | ICD-10-CM

## 2022-11-06 DIAGNOSIS — M25561 Pain in right knee: Secondary | ICD-10-CM | POA: Diagnosis not present

## 2022-11-06 NOTE — Therapy (Signed)
OUTPATIENT PHYSICAL THERAPY LOWER EXTREMITY TREATMENT   Patient Name: Kelly Farley MRN: [id number] DOB:09-25-1955, 67 y.o., female Today's Date: 11/06/2022  END OF SESSION:  PT End of Session - 11/06/22 0945     Visit Number 13    Number of Visits 16    Date for PT Re-Evaluation 11/09/22    Progress Note Due on Visit 18    PT Start Time 0946    PT Stop Time 1025    PT Time Calculation (min) 39 min    Behavior During Therapy [facility] for tasks assessed/performed             Past Medical History:  Diagnosis Date   Arthritis    Diabetes mellitus without complication (HCC)    Hyperlipemia    Hypertension    Other abnormal findings in urine 07/05/2021   Pain in right knee 02/14/2012   Palpitations    wore holter monitor 30 years ago; murmur no longer an issue per patient   Sleep apnea    Sweats, menopausal 10/20/2015   Past Surgical History:  Procedure Laterality Date   ABDOMINAL HYSTERECTOMY     total   BREAST BIOPSY Left 04/03/2022   stereo bx, calcs, RIBBON clip-BENIGN MAMMARY PARENCHYMA WITH FIBROADENOMATOID CHANGES AND ASSOCIATED COARSE DYSTROPHIC CALCIFICATIONS.   BREAST BIOPSY Left 04/03/2022   MM LT BREAST BX W LOC DEV 1ST LESION IMAGE BX SPEC STEREO GUIDE 04/03/2022 [facility]   COLONOSCOPY WITH PROPOFOL N/A 08/18/2020   Procedure: COLONOSCOPY WITH PROPOFOL;  Surgeon: Pasty Spillers, MD;  Location: [facility];  Service: Endoscopy;  Laterality: N/A;   EYE SURGERY     JOINT REPLACEMENT  01/09/2012   right knee   TRANSFORAMINAL LUMBAR INTERBODY FUSION (TLIF) WITH PEDICLE SCREW FIXATION 2 LEVEL N/A 12/19/2021   Procedure: Lumbar Four- Five Lumbar Five-Sacral One Open laminectomy/Transforaminal Lumbar Interbody Fusion/Posterolateral instrumented fusion;  Surgeon: Jadene Pierini, MD;  Location: [facility] OR;  Service: Neurosurgery;  Laterality: N/A;  RM 21/3C/TO FOLLOW   Patient Active Problem List   Diagnosis Date Noted   Morbid obesity with body mass  index (BMI) of 40.0 or higher (HCC) 06/19/2022   Esophageal reflux 04/10/2022   Fusion of spine, lumbar region 04/10/2022   Spondylolisthesis of lumbar region 12/19/2021   Simple renal cyst 08/22/2021   Other obesity 07/05/2021   Stage 3b chronic kidney disease (HCC) 07/05/2021   Cecal polyp    Polyp of sigmoid colon    Chronic kidney disease (CKD) 07/16/2019   Mixed hyperlipidemia 07/16/2018   Bronchitis 01/20/2016   Idiopathic gout, left ankle and foot 01/20/2016   Menopausal and female climacteric states 10/20/2015   Obstructive sleep apnea 07/16/2014   Type 2 diabetes mellitus without complications (HCC) 11/17/2013   Edema 04/23/2011   Unspecified osteoarthritis, unspecified site 07/16/2002   Hypertension 07/16/1998    PCP: Sherron Monday, MD   REFERRING PROVIDER: Andrena Mews, DO   REFERRING DIAG:  Bilateral primary osteoarthritis of knee  M25.561 (ICD-10-CM) - Pain in right knee  R26.2 (ICD-10-CM) - Difficulty in walking, not elsewhere classified    THERAPY DIAG:  Chronic pain of right knee  Chronic pain of left knee  Other abnormalities of gait and mobility  Muscle weakness (generalized)  Rationale for Evaluation and Treatment: Rehabilitation  ONSET DATE: exacerbated last year  SUBJECTIVE:   SUBJECTIVE STATEMENT: Pt reports that she had her injection in Lt knee yesterday; pain is improved. She reports she can turn her head further with less pain.  She has not checked out the [facility] yet, but plans to.   ACCESS TO POOL:  considering [employer].    EVAL:[place] 10 years in [place] Had bad experience with right knee.  Trying to avoid L TKR. I am retired.  Cared for parents.  I am limited with walking a lot.  I used my cane down here use it as a precaution.  LB surgery in dec.  Sciatic pain in rle is gone. Right foot still has some numbness.  I think I could have walked another 100 ft before needing to sit. Use electric scooters in grocery stores although  I would like to be able to walk through them.  I am walking a lot better since back surgery.  Walked in sand on beach this summer and it really hurt, increased pain left knee.  Right knee no pain just stiffness   PERTINENT HISTORY: Lumbar spondylolisthesis and stenosis : 2 level decompression and TLIF/PLF 12/21/21 PAIN:  Are you having pain?yes : NPRS scale: 3/10  Pain location: L knee and heel, Lt side of neck  Pain description: dull, sharp with movement Aggravating factors: walking, getting and out of car Relieving factors: sitting,lyingdown, tylenol  PRECAUTIONS: Fall  RED FLAGS: None   WEIGHT BEARING RESTRICTIONS: No  FALLS:  Has patient fallen in last 6 months? No  LIVING ENVIRONMENT: Lives with: lives alone Lives in: House/apartment Stairs: Yes: External: 4-5 steps; on left going up Has following equipment at home: Single point cane  OCCUPATION: retired  PLOF: Independent  PATIENT GOALS: Walk through grocery store, going to foot ball game, vacuuming/house chores    OBJECTIVE:    PATIENT SURVEYS:  FOTO risk adjusted 42% with goal of 61% 10/09/22: 57%  COGNITION: Overall cognitive status: Within functional limits for tasks assessed     SENSATION: Around right toes and heel residual numbness due to LB dyfunction.  Has improved since surgery    MUSCLE LENGTH: Hamstrings: Full knee extension   POSTURE: weight shift left and ue and shoulder guarding  PALPATION: L knee crepitus  LOWER EXTREMITY ROM:  Active ROM Right eval Left eval Left  9/17 Left 9/26  Hip flexion      Hip extension      Hip abduction      Hip adduction      Hip internal rotation      Hip external rotation      Knee flexion 112 92 92 100 seated scoot  Knee extension  -13 -17 -12 LAQ  Ankle dorsiflexion      Ankle plantarflexion      Ankle inversion      Ankle eversion       (Blank rows = not tested)  LOWER EXTREMITY MMT:  MMT Right eval Left eval Lt    Hip  flexion 30.4 24.2   Hip extension     Hip abduction 26.2 22.8   Hip adduction     Hip internal rotation     Hip external rotation     Knee flexion     Knee extension 33.0 22.8   Ankle dorsiflexion     Ankle plantarflexion     Ankle inversion     Ankle eversion      (Blank rows = not tested)   FUNCTIONAL TESTS:  Timed up and go (TUG): 21.58 Berg Balance Scale: 28/56 09/25/22: TUG : 18.96s without cane 10/04/22: 20.37s without cane; 15.17s with cane  10/09/22: Berg 42/56  GAIT: Distance walked: 400 ft  Assistive device utilized: Single point cane Level of assistance: Modified independence Comments: Shifted right, no left heel strike/ TKE left, antalgic limp   TODAY'S TREATMENT:       10/29: Pt seen for aquatic therapy today.  Treatment took place in water 3.5-4.75 ft in depth at the [facility] pool. Temp of water was 86.  Pt entered/exited the pool via stairs in step-to pattern with bilat rail.  *  walking forward/ backward with UE on yellow hand floats * side stepping R/L with arm addct/ abdct with yellow -> rainbow hand floats * farmer carry with single/ bilat yellow hand floats under water walking forward/ backwards * holding wall:  toe raises x 8;  single leg clam x 8 each (tight L hip);  hip abdct/ addct with cues for toe pointing forward x 10 each LE; hip ext to toe touch x 10  * return to marching forward/ backward with runners arm swing * back against wall with hollow noodle at ankle, 3 way LE stretch  * L stretch at wall   10/18: Pt seen for aquatic therapy today.  Treatment took place in water 3.5-4.75 ft in depth at the [facility] pool. Temp of water was 91.  Pt entered/exited the pool via stairs in step-to pattern with bilat rail.  *  walking forward/ backward- multiple lengths, side stepping R/L cues for upright trunk * side stepping with arm addct with rainbow hand floats * holding wall:  single leg clam, alternating LEs x 10 each; hip  abdct/ addct with cues for toe pointing forward x 10 each LE; leg swings into hip flex/ ext  x 1- each * UE yellow hand floats -> no support: tandem gait forward/ backwards (challenge); hip abdct/ addct x 10 * Seated on bench in water with blue step under feet - cycling, STS with eccentric lowering x 8 -cues for even weight between feet   10/15:  -LTR- 5" x10ea -DKTC with physioball (red) x10- 5sec hold -Supine march with TrA 2x10 -Sidelying clams 2x10ea -Bridges 2x10 Retro walking along rail x2 laps -Standing hip abd/ext 2x5ea  Standing march x20 (some heel pain) -Seated march 2x10 -LAQ 5" 2x10ea  -standing HSC 3x5ea  10/8:  -Piriformis stretch 30sec x2ea -LTR- 5" x10ea -DKTC with physioball (red) x10- 5sec hold -Supine march with TrA 2x10 -Sidelying clams 2x10ea Retro walking along rail x2 laps -Standing hip abd/ext 2x5ea  -Seated march 2x10 -LAQ 5" 2x10ea  -standing HSC 3x5ea -Standing gastroc stretch- 20sec x3 bil -HEP update (gastroc stretch and standing HSC)  10/3:  -Piriformis stretch -LTR -DKTC with physioball (red) x10 -Supine march with TrA 2x10 -Sidelying clams 2x10ea Retro walking along rail x2 laps -Standing hip abd/ext 2x5ea  -Seated march 2x10 -Standing tB row/ext with TRA RTB x10ea -HEP   PATIENT EDUCATION:  Education details: aquatic exercise progressions/modifications; issued laminated HEP   Person educated: Patient Education method: Explanation Education comprehension: verbalized understanding  HOME EXERCISE PROGRAM: Access Code: [email or web address] URL: [email or web address] Date: 10/11/2022 Prepared by: Riki Altes  Exercises - Supine Lower Trunk Rotation  - 2 x daily - 7 x weekly - 2-3 sets - 10 reps - 5seconds hold - Hooklying Single Knee to Chest Stretch  - 2 x daily - 7 x weekly - 3 sets - 20seconds hold - Clamshell  - 2 x daily - 7 x weekly - 2-3 sets - 10 reps - Supine March  - 2 x daily - 7 x weekly - 3 sets - 10  reps  ASSESSMENT:  CLINICAL IMPRESSION: Lt hip remains tight with limited ER and abdct with Leg exercises in pool. Intermittent relief of pain in Lt knee, unless working ROM. Issued laminated aquatic HEP and verbally reviewed. Pt in agreement to d/c from PT after next land appt.  Therapist to assess goals and update HEP.     OBJECTIVE IMPAIRMENTS: Abnormal gait, decreased activity tolerance, decreased balance, decreased endurance, decreased mobility, difficulty walking, decreased ROM, decreased strength, improper body mechanics, obesity, and pain.   ACTIVITY LIMITATIONS: carrying, lifting, bending, standing, squatting, sleeping, stairs, transfers, and locomotion level  PARTICIPATION LIMITATIONS: meal prep, cleaning, laundry, shopping, community activity, and yard work  PERSONAL FACTORS: Age, Fitness, and 1-2 comorbidities: DM and morbid obesity  are also affecting patient's functional outcome.   REHAB POTENTIAL: Good  CLINICAL DECISION MAKING: Evolving/moderate complexity  EVALUATION COMPLEXITY: Moderate   GOALS: Goals reviewed with patient? Yes  SHORT TERM GOALS: Target date: 09/18/22 Pt will tolerate full aquatic sessions consistently without increase in pain and with improving function to demonstrate good toleration and effectiveness of intervention.  Baseline: Goal status: Met  09/27/22  2.  Pt will improve on Tug test to <or=  17 to demonstrate improvement in lower extremity function, mobility and decreased fall risk. Baseline: 21.58at eval Goal status: Met - 10/04/22  3.  Pt will improve with transfers in and out of car with increase in pain Baseline:  Goal status: met - per her report- 10/04/22  4.  L knee flex to 100d and extension towards 0 for improved gait pattern Baseline: see chart Goal status: Partially met -  10/04/22  5.  Pt will report walking through grocery store pushing cart for up towards 25 minutes (personal goal) Baseline: electric cart at eval;  pushing  cart at drug store for 10 min- 10/04/22 Goal status:Met 10/09/22    LONG TERM GOALS: Target date: 11/09/22  Pt to meet stated Foto Goal 61% Baseline: risk adjusted 42% Goal status: INITIAL  2.  Pt will be indep with final HEP's (land and aquatic as appropriate) for continued management of condition Baseline:  Goal status: INITIAL  3.  Pt will improve strength in all areas listed by at least 10 lbs to demonstrate improved overall physical function  Baseline:  Goal status: INITIAL  4.  Pt will improve on Berg balance test to >/=45/56 to demonstrate a decrease in fall risk. Baseline: 28/56; 42/56 Goal status: In progress 10/09/22  5.  Pt will report toleration to vacuuming and other household chores Baseline: 20-30% improvement  Goal status: In progress - 10/26/22  6.  Pt will have a reduction in her worst pain to 4/10 or less to demonstrate improvement management of condition Baseline:  Goal status: INITIAL   PLAN:  PT FREQUENCY: 1-2x/week  PT DURATION: 4 weeks  PLANNED INTERVENTIONS: Therapeutic exercises, Therapeutic activity, Neuromuscular re-education, Balance training, Gait training, Patient/Family education, Self Care, Joint mobilization, Stair training, Orthotic/Fit training, DME instructions, Aquatic Therapy, Dry Needling, Electrical stimulation, Moist heat, Taping, Ionotophoresis 4mg /ml Dexamethasone, Manual therapy, and Re-evaluation  PLAN FOR NEXT SESSION: assess remaining  goals and readiness for d/c.   Mayer Camel, PTA 11/06/22 10:57 AM [facility] [address] [place], [place], [address] Phone: [phone]   Fax:  [phone]

## 2022-11-08 ENCOUNTER — Ambulatory Visit (HOSPITAL_BASED_OUTPATIENT_CLINIC_OR_DEPARTMENT_OTHER): Payer: PPO

## 2022-11-08 ENCOUNTER — Encounter (HOSPITAL_BASED_OUTPATIENT_CLINIC_OR_DEPARTMENT_OTHER): Payer: Self-pay

## 2022-11-08 DIAGNOSIS — R2689 Other abnormalities of gait and mobility: Secondary | ICD-10-CM

## 2022-11-08 DIAGNOSIS — M25561 Pain in right knee: Secondary | ICD-10-CM | POA: Diagnosis not present

## 2022-11-08 DIAGNOSIS — G8929 Other chronic pain: Secondary | ICD-10-CM

## 2022-11-08 DIAGNOSIS — M6281 Muscle weakness (generalized): Secondary | ICD-10-CM

## 2022-11-08 NOTE — Therapy (Addendum)
 OUTPATIENT PHYSICAL THERAPY LOWER EXTREMITY TREATMENT   Patient Name: Kelly Farley MRN: [id number] DOB:[birth year], 67 y.o., female Today's Date: 11/08/2022  END OF SESSION:  PT End of Session - 11/08/22 1147     Visit Number 14    Number of Visits 16    Date for PT Re-Evaluation 11/09/22    Progress Note Due on Visit 18    PT Start Time 1146    PT Stop Time 1231    PT Time Calculation (min) 45 min    Activity Tolerance Patient tolerated treatment well    Behavior During Therapy [facility] for tasks assessed/performed              Past Medical History:  Diagnosis Date   Arthritis    Diabetes mellitus without complication (HCC)    Hyperlipemia    Hypertension    Other abnormal findings in urine 07/05/2021   Pain in right knee 02/14/2012   Palpitations    wore holter monitor 30 years ago; murmur no longer an issue per patient   Sleep apnea    Sweats, menopausal 10/20/2015   Past Surgical History:  Procedure Laterality Date   ABDOMINAL HYSTERECTOMY     total   BREAST BIOPSY Left 04/03/2022   stereo bx, calcs, RIBBON clip-BENIGN MAMMARY PARENCHYMA WITH FIBROADENOMATOID CHANGES AND ASSOCIATED COARSE DYSTROPHIC CALCIFICATIONS.   BREAST BIOPSY Left 04/03/2022   MM LT BREAST BX W LOC DEV 1ST LESION IMAGE BX SPEC STEREO GUIDE 04/03/2022 [facility]   COLONOSCOPY WITH PROPOFOL N/A 08/18/2020   Procedure: COLONOSCOPY WITH PROPOFOL;  Surgeon: Pasty Spillers, MD;  Location: [facility] ENDOSCOPY;  Service: Endoscopy;  Laterality: N/A;   EYE SURGERY     JOINT REPLACEMENT  01/09/2012   right knee   TRANSFORAMINAL LUMBAR INTERBODY FUSION (TLIF) WITH PEDICLE SCREW FIXATION 2 LEVEL N/A 12/19/2021   Procedure: Lumbar Four- Five Lumbar Five-Sacral One Open laminectomy/Transforaminal Lumbar Interbody Fusion/Posterolateral instrumented fusion;  Surgeon: Jadene Pierini, MD;  Location: [facility] OR;  Service: Neurosurgery;  Laterality: N/A;  RM 21/3C/TO FOLLOW   Patient Active Problem List    Diagnosis Date Noted   Morbid obesity with body mass index (BMI) of 40.0 or higher (HCC) 06/19/2022   Esophageal reflux 04/10/2022   Fusion of spine, lumbar region 04/10/2022   Spondylolisthesis of lumbar region 12/19/2021   Simple renal cyst 08/22/2021   Other obesity 07/05/2021   Stage 3b chronic kidney disease (HCC) 07/05/2021   Cecal polyp    Polyp of sigmoid colon    Chronic kidney disease (CKD) 07/16/2019   Mixed hyperlipidemia 07/16/2018   Bronchitis 01/20/2016   Idiopathic gout, left ankle and foot 01/20/2016   Menopausal and female climacteric states 10/20/2015   Obstructive sleep apnea 07/16/2014   Type 2 diabetes mellitus without complications (HCC) 11/17/2013   Edema 04/23/2011   Unspecified osteoarthritis, unspecified site 07/16/2002   Hypertension 07/16/1998    PCP: Sherron Monday, MD   REFERRING PROVIDER: Andrena Mews, DO   REFERRING DIAG:  Bilateral primary osteoarthritis of knee  M25.561 (ICD-10-CM) - Pain in right knee  R26.2 (ICD-10-CM) - Difficulty in walking, not elsewhere classified    THERAPY DIAG:  Chronic pain of right knee  Chronic pain of left knee  Other abnormalities of gait and mobility  Muscle weakness (generalized)  Rationale for Evaluation and Treatment: Rehabilitation  ONSET DATE: exacerbated last year  SUBJECTIVE:   SUBJECTIVE STATEMENT: L knee is feeling better since injection. Foot feels better after being colder water.  ACCESS TO POOL:  considering [employer].    EVAL:[place] Had bad experience with right knee.  Trying to avoid L TKR. I am retired.  Cared for parents.  I am limited with walking a lot.  I used my cane down here use it as a precaution.  LB surgery in dec.  Sciatic pain in rle is gone. Right foot still has some numbness.  I think I could have walked another 100 ft before needing to sit. Use electric scooters in grocery stores although I would like to be able to walk through them.   I am walking a lot better since back surgery.  Walked in sand on beach this summer and it really hurt, increased pain left knee.  Right knee no pain just stiffness   PERTINENT HISTORY: Lumbar spondylolisthesis and stenosis : 2 level decompression and TLIF/PLF 12/21/21 PAIN:  Are you having pain?yes : NPRS scale: 2-3/10  Pain location: L knee and heel, Lt side of neck  Pain description: dull, sharp with movement Aggravating factors: walking, getting and out of car Relieving factors: sitting,lyingdown, tylenol  PRECAUTIONS: Fall  RED FLAGS: None   WEIGHT BEARING RESTRICTIONS: No  FALLS:  Has patient fallen in last 6 months? No  LIVING ENVIRONMENT: Lives with: lives alone Lives in: House/apartment Stairs: Yes: External: 4-5 steps; on left going up Has following equipment at home: Single point cane  OCCUPATION: retired  PLOF: Independent  PATIENT GOALS: Walk through grocery store, going to foot ball game, vacuuming/house chores    OBJECTIVE:    PATIENT SURVEYS:  FOTO risk adjusted 42% with goal of 61% 10/09/22: 57%  COGNITION: Overall cognitive status: Within functional limits for tasks assessed     SENSATION: Around right toes and heel residual numbness due to LB dyfunction.  Has improved since surgery    MUSCLE LENGTH: Hamstrings: Full knee extension   POSTURE: weight shift left and ue and shoulder guarding  PALPATION: L knee crepitus  LOWER EXTREMITY ROM:  Active ROM Right eval Left eval Left  9/17 Left 9/26 Left 10/31  Hip flexion       Hip extension       Hip abduction       Hip adduction       Hip internal rotation       Hip external rotation       Knee flexion 112 92 92 100 seated scoot 85deg  Knee extension  -13 -17 -12 LAQ -6  Ankle dorsiflexion       Ankle plantarflexion       Ankle inversion       Ankle eversion        (Blank rows = not tested)  LOWER EXTREMITY MMT:  MMT Right eval Left eval Left 10/31  Hip flexion 30.4  24.2 38.4  Hip extension     Hip abduction 26.2 22.8 31.3  Hip adduction     Hip internal rotation     Hip external rotation     Knee flexion     Knee extension 33.0 22.8 19.9  Ankle dorsiflexion     Ankle plantarflexion     Ankle inversion     Ankle eversion      (Blank rows = not tested)   FUNCTIONAL TESTS:  Timed up and go (TUG): 21.58 Berg Balance Scale: 28/56 09/25/22: TUG : 18.96s without cane 10/04/22: 20.37s without cane; 15.17s with cane  10/09/22: Berg 42/56 10/31: BERG 49/56  GAIT: Distance  walked: 400 ft Assistive device utilized: Single point cane Level of assistance: Modified independence Comments: Shifted right, no left heel strike/ TKE left, antalgic limp   TODAY'S TREATMENT:         10/31:  FOTO 49% Updated objective measures Updated goals BERG balance test    10/29: Pt seen for aquatic therapy today.  Treatment took place in water 3.5-4.75 ft in depth at the [facility] pool. Temp of water was 86.  Pt entered/exited the pool via stairs in step-to pattern with bilat rail.  *  walking forward/ backward with UE on yellow hand floats * side stepping R/L with arm addct/ abdct with yellow -> rainbow hand floats * farmer carry with single/ bilat yellow hand floats under water walking forward/ backwards * holding wall:  toe raises x 8;  single leg clam x 8 each (tight L hip);  hip abdct/ addct with cues for toe pointing forward x 10 each LE; hip ext to toe touch x 10  * return to marching forward/ backward with runners arm swing * back against wall with hollow noodle at ankle, 3 way LE stretch  * L stretch at wall   10/18: Pt seen for aquatic therapy today.  Treatment took place in water 3.5-4.75 ft in depth at the [facility] pool. Temp of water was 91.  Pt entered/exited the pool via stairs in step-to pattern with bilat rail.  *  walking forward/ backward- multiple lengths, side stepping R/L cues for upright trunk * side stepping  with arm addct with rainbow hand floats * holding wall:  single leg clam, alternating LEs x 10 each; hip abdct/ addct with cues for toe pointing forward x 10 each LE; leg swings into hip flex/ ext  x 1- each * UE yellow hand floats -> no support: tandem gait forward/ backwards (challenge); hip abdct/ addct x 10 * Seated on bench in water with blue step under feet - cycling, STS with eccentric lowering x 8 -cues for even weight between feet   10/15:  -LTR- 5" x10ea -DKTC with physioball (red) x10- 5sec hold -Supine march with TrA 2x10 -Sidelying clams 2x10ea -Bridges 2x10 Retro walking along rail x2 laps -Standing hip abd/ext 2x5ea  Standing march x20 (some heel pain) -Seated march 2x10 -LAQ 5" 2x10ea  -standing HSC 3x5ea  10/8:  -Piriformis stretch 30sec x2ea -LTR- 5" x10ea -DKTC with physioball (red) x10- 5sec hold -Supine march with TrA 2x10 -Sidelying clams 2x10ea Retro walking along rail x2 laps -Standing hip abd/ext 2x5ea  -Seated march 2x10 -LAQ 5" 2x10ea  -standing HSC 3x5ea -Standing gastroc stretch- 20sec x3 bil -HEP update (gastroc stretch and standing HSC)  10/3:  -Piriformis stretch -LTR -DKTC with physioball (red) x10 -Supine march with TrA 2x10 -Sidelying clams 2x10ea Retro walking along rail x2 laps -Standing hip abd/ext 2x5ea  -Seated march 2x10 -Standing tB row/ext with TRA RTB x10ea -HEP   PATIENT EDUCATION:  Education details: aquatic exercise progressions/modifications; issued laminated HEP   Person educated: Patient Education method: Explanation Education comprehension: verbalized understanding  HOME EXERCISE PROGRAM: Access Code: [email or web address] URL: [email or web address] Date: 10/11/2022 Prepared by: Riki Altes  Exercises - Supine Lower Trunk Rotation  - 2 x daily - 7 x weekly - 2-3 sets - 10 reps - 5seconds hold - Hooklying Single Knee to Chest Stretch  - 2 x daily - 7 x weekly - 3 sets - 20seconds hold - Clamshell  - 2  x daily - 7 x weekly - 2-3 sets -  10 reps - Supine March  - 2 x daily - 7 x weekly - 3 sets - 10 reps  ASSESSMENT:  CLINICAL IMPRESSION: Pt has attended 14 visits of PT and has made improved subjective progress. Feels improved with standing/walking tolerance. Reports ~60% improved since IE.  Patient demonstrates improved objective progress demonstrated through improved strength and improved knee extension.  Patient has met 4 out of 5 short-term goals and 4 out of 6 long-term goals.  Patient does demonstrate regression of knee flexion range of motion. FOTO score has decreased from IE despite subjective improvements.  At this point patient has a good grasp on HEP and range of motion exercises to continue with at home.  Patient remains limited by knee pathology, however she understands the importance of continuing with HEP to prevent further regression of progress.  OBJECTIVE IMPAIRMENTS: Abnormal gait, decreased activity tolerance, decreased balance, decreased endurance, decreased mobility, difficulty walking, decreased ROM, decreased strength, improper body mechanics, obesity, and pain.   ACTIVITY LIMITATIONS: carrying, lifting, bending, standing, squatting, sleeping, stairs, transfers, and locomotion level  PARTICIPATION LIMITATIONS: meal prep, cleaning, laundry, shopping, community activity, and yard work  PERSONAL FACTORS: Age, Fitness, and 1-2 comorbidities: DM and morbid obesity  are also affecting patient's functional outcome.   REHAB POTENTIAL: Good  CLINICAL DECISION MAKING: Evolving/moderate complexity  EVALUATION COMPLEXITY: Moderate   GOALS: Goals reviewed with patient? Yes  SHORT TERM GOALS: Target date: 09/18/22 Pt will tolerate full aquatic sessions consistently without increase in pain and with improving function to demonstrate good toleration and effectiveness of intervention.  Baseline: Goal status: Met  09/27/22  2.  Pt will improve on Tug test to <or=  17 to demonstrate  improvement in lower extremity function, mobility and decreased fall risk. Baseline: 21.58at eval Goal status: Met - 10/04/22  3.  Pt will improve with transfers in and out of car with increase in pain Baseline:  Goal status: met - per her report- 10/04/22  4.  L knee flex to 100d and extension towards 0 for improved gait pattern Baseline: see chart Goal status: Partially met - 10/31 (knee extension in improved, knee flexion has regressed)  5.  Pt will report walking through grocery store pushing cart for up towards 25 minutes (personal goal) Baseline: electric cart at eval;  pushing cart at drug store for 10 min- 10/04/22 Goal status:Met 10/09/22    LONG TERM GOALS: Target date: 11/09/22  Pt to meet stated Foto Goal 61% Baseline: risk adjusted 42% Goal status: In PROGRESS  FOTO 49% 10/31  2.  Pt will be indep with final HEP's (land and aquatic as appropriate) for continued management of condition Baseline:  Goal status: MET 10/31  3.  Pt will improve strength in all areas listed by at least 10 lbs to demonstrate improved overall physical function  Baseline:  Goal status: IN PROGRESS (met all except for knee extension)  4.  Pt will improve on Berg balance test to >/=45/56 to demonstrate a decrease in fall risk. Baseline: 28/56; 42/56 Goal status: MET 10/31  5.  Pt will report toleration to vacuuming and other household chores Baseline: 20-30% improvement  Goal status: MET 10/31 (mild deficit, but can do what has to be done)  6.  Pt will have a reduction in her worst pain to 4/10 or less to demonstrate improvement management of condition Baseline:  Goal status: MET (3-4/10 on 10/31)   PLAN:  PT FREQUENCY: 1-2x/week  PT DURATION: 4 weeks  PLANNED INTERVENTIONS: Therapeutic exercises,  Therapeutic activity, Neuromuscular re-education, Balance training, Gait training, Patient/Family education, Self Care, Joint mobilization, Stair training, Orthotic/Fit training, DME  instructions, Aquatic Therapy, Dry Needling, Electrical stimulation, Moist heat, Taping, Ionotophoresis 4mg /ml Dexamethasone, Manual therapy, and Re-evaluation  PLAN FOR NEXT SESSION:D/C    Riki Altes, PTA  11/08/22 3:00 PM [facility] [address] [place], [place], [address] Phone: [phone]   Fax:  [phone]   Addend Therapist reviewed DC with PTA and agrees. PHYSICAL THERAPY DISCHARGE SUMMARY  Visits from Start of Care: 14  Current functional level related to goals / functional outcomes: indep   Remaining deficits: Chronic pain   Education / Equipment: Management of condition/HEP   Patient agrees to discharge. Patient goals were partially met. Patient is being discharged due to maximized rehab potential.    Corrie Dandy Tomma Lightning) Ziemba MPT 02/28/23 12:29 PM [facility] [address] [place], [place], [address] Phone: [phone]   Fax:  [phone]

## 2022-11-15 ENCOUNTER — Ambulatory Visit (HOSPITAL_BASED_OUTPATIENT_CLINIC_OR_DEPARTMENT_OTHER): Payer: Self-pay | Admitting: Physical Therapy

## 2022-11-20 ENCOUNTER — Ambulatory Visit (INDEPENDENT_AMBULATORY_CARE_PROVIDER_SITE_OTHER): Payer: PPO

## 2022-11-20 DIAGNOSIS — G4733 Obstructive sleep apnea (adult) (pediatric): Secondary | ICD-10-CM | POA: Diagnosis not present

## 2022-11-20 DIAGNOSIS — M8589 Other specified disorders of bone density and structure, multiple sites: Secondary | ICD-10-CM | POA: Diagnosis not present

## 2022-11-22 ENCOUNTER — Telehealth: Payer: Self-pay | Admitting: Internal Medicine

## 2022-11-22 NOTE — Telephone Encounter (Signed)
Patient left VM requesting we send something in for her - states she is having a gout flare-up. Please advise.

## 2022-11-23 ENCOUNTER — Other Ambulatory Visit: Payer: Self-pay | Admitting: Internal Medicine

## 2022-11-23 DIAGNOSIS — M1A9XX0 Chronic gout, unspecified, without tophus (tophi): Secondary | ICD-10-CM

## 2022-11-23 MED ORDER — COLCHICINE 0.6 MG PO TABS
0.6000 mg | ORAL_TABLET | ORAL | 0 refills | Status: AC
Start: 2022-11-23 — End: 2023-11-23

## 2022-11-27 NOTE — Telephone Encounter (Signed)
Pt sated that since taking the rx that you called in for her for her gout she is having different symptoms from taking it, being extremely tired and she also said that it use to make her go to the bathroom a lot and this time it is not- she wanted to make sure that is was ok to be taking with her allopurinal 100MG - Please advise

## 2022-12-08 ENCOUNTER — Other Ambulatory Visit: Payer: Self-pay | Admitting: Internal Medicine

## 2022-12-08 DIAGNOSIS — I10 Essential (primary) hypertension: Secondary | ICD-10-CM

## 2022-12-17 ENCOUNTER — Other Ambulatory Visit: Payer: Self-pay

## 2022-12-19 DIAGNOSIS — M1000 Idiopathic gout, unspecified site: Secondary | ICD-10-CM | POA: Diagnosis not present

## 2022-12-19 DIAGNOSIS — E785 Hyperlipidemia, unspecified: Secondary | ICD-10-CM | POA: Diagnosis not present

## 2022-12-19 DIAGNOSIS — N1832 Chronic kidney disease, stage 3b: Secondary | ICD-10-CM | POA: Diagnosis not present

## 2022-12-19 DIAGNOSIS — N281 Cyst of kidney, acquired: Secondary | ICD-10-CM | POA: Diagnosis not present

## 2022-12-19 DIAGNOSIS — R82998 Other abnormal findings in urine: Secondary | ICD-10-CM | POA: Diagnosis not present

## 2022-12-19 DIAGNOSIS — E1122 Type 2 diabetes mellitus with diabetic chronic kidney disease: Secondary | ICD-10-CM | POA: Diagnosis not present

## 2022-12-19 DIAGNOSIS — G4733 Obstructive sleep apnea (adult) (pediatric): Secondary | ICD-10-CM | POA: Diagnosis not present

## 2022-12-19 DIAGNOSIS — I10 Essential (primary) hypertension: Secondary | ICD-10-CM | POA: Diagnosis not present

## 2022-12-26 DIAGNOSIS — I10 Essential (primary) hypertension: Secondary | ICD-10-CM | POA: Diagnosis not present

## 2022-12-26 DIAGNOSIS — E1122 Type 2 diabetes mellitus with diabetic chronic kidney disease: Secondary | ICD-10-CM | POA: Diagnosis not present

## 2022-12-26 DIAGNOSIS — E669 Obesity, unspecified: Secondary | ICD-10-CM | POA: Diagnosis not present

## 2022-12-26 DIAGNOSIS — M1000 Idiopathic gout, unspecified site: Secondary | ICD-10-CM | POA: Diagnosis not present

## 2022-12-26 DIAGNOSIS — N1832 Chronic kidney disease, stage 3b: Secondary | ICD-10-CM | POA: Diagnosis not present

## 2022-12-26 DIAGNOSIS — G4733 Obstructive sleep apnea (adult) (pediatric): Secondary | ICD-10-CM | POA: Diagnosis not present

## 2022-12-26 DIAGNOSIS — E785 Hyperlipidemia, unspecified: Secondary | ICD-10-CM | POA: Diagnosis not present

## 2023-01-25 NOTE — Progress Notes (Unsigned)
   Rubin Payor, PhD, LAT, ATC acting as a scribe for Clementeen Graham, MD.  Kelly Farley is a 68 y.o. female who presents to [facility] today for L knee pain. Pt was previously seen by Dr. Berline Chough. Last Zilretta injection was in her L knee on 08/10/22.  Today, pt c/o cont'd knee pain. Pain returned ***  Swelling: Aggravates: Treatments tried: prior Zilretta injection  Pertinent review of systems: ***  Relevant historical information: ***   Exam:  There were no vitals taken for this visit. General: Well Developed, well nourished, and in no acute distress.   MSK: ***    Lab and Radiology Results No results found for this or any previous visit (from the past 72 hours). No results found.     Assessment and Plan: 68 y.o. female with ***   PDMP not reviewed this encounter. No orders of the defined types were placed in this encounter.  No orders of the defined types were placed in this encounter.    Discussed warning signs or symptoms. Please see discharge instructions. Patient expresses understanding.   ***

## 2023-01-28 ENCOUNTER — Telehealth: Payer: Self-pay

## 2023-01-28 ENCOUNTER — Ambulatory Visit: Payer: PPO | Admitting: Family Medicine

## 2023-01-28 ENCOUNTER — Other Ambulatory Visit: Payer: PPO

## 2023-01-28 ENCOUNTER — Other Ambulatory Visit: Payer: Self-pay

## 2023-01-28 VITALS — BP 156/92 | HR 64 | Ht 67.5 in | Wt 267.0 lb

## 2023-01-28 DIAGNOSIS — G8929 Other chronic pain: Secondary | ICD-10-CM | POA: Diagnosis not present

## 2023-01-28 DIAGNOSIS — E782 Mixed hyperlipidemia: Secondary | ICD-10-CM

## 2023-01-28 DIAGNOSIS — M1712 Unilateral primary osteoarthritis, left knee: Secondary | ICD-10-CM

## 2023-01-28 DIAGNOSIS — M25562 Pain in left knee: Secondary | ICD-10-CM | POA: Diagnosis not present

## 2023-01-28 DIAGNOSIS — E119 Type 2 diabetes mellitus without complications: Secondary | ICD-10-CM

## 2023-01-28 NOTE — Telephone Encounter (Signed)
-----   Message from Adron Bene sent at 01/28/2023  9:43 AM EST ----- Regarding: Zilretta Please work to [employer], L knee

## 2023-01-28 NOTE — Telephone Encounter (Signed)
VOB initiated for Zilretta for LEFT knee OA 

## 2023-01-28 NOTE — Patient Instructions (Addendum)
Thank you for coming in today.   We will get your records from Dr. Janeece Riggers office  Gearldine Bienenstock will work to authorize Kelly Farley  Here is a hand out about the Genicular Artery Embolization procedure we talked about  You will hear from our office about scheduling once we get Zilretta authorized.

## 2023-01-29 LAB — COMPREHENSIVE METABOLIC PANEL
ALT: 20 [IU]/L (ref 0–32)
AST: 31 [IU]/L (ref 0–40)
Albumin: 4 g/dL (ref 3.9–4.9)
Alkaline Phosphatase: 78 [IU]/L (ref 44–121)
BUN/Creatinine Ratio: 17 (ref 12–28)
BUN: 19 mg/dL (ref 8–27)
Bilirubin Total: 0.4 mg/dL (ref 0.0–1.2)
CO2: 26 mmol/L (ref 20–29)
Calcium: 10.2 mg/dL (ref 8.7–10.3)
Chloride: 100 mmol/L (ref 96–106)
Creatinine, Ser: 1.1 mg/dL — ABNORMAL HIGH (ref 0.57–1.00)
Globulin, Total: 2.6 g/dL (ref 1.5–4.5)
Glucose: 94 mg/dL (ref 70–99)
Potassium: 4.2 mmol/L (ref 3.5–5.2)
Sodium: 142 mmol/L (ref 134–144)
Total Protein: 6.6 g/dL (ref 6.0–8.5)
eGFR: 54 mL/min/{1.73_m2} — ABNORMAL LOW (ref >59)

## 2023-01-29 LAB — LIPID PANEL
Chol/HDL Ratio: 2.7 {ratio} (ref 0.0–4.4)
Cholesterol, Total: 159 mg/dL (ref 100–199)
HDL: 58 mg/dL (ref >39)
LDL Chol Calc (NIH): 88 mg/dL (ref 0–99)
Triglycerides: 65 mg/dL (ref 0–149)
VLDL Cholesterol Cal: 13 mg/dL (ref 5–40)

## 2023-01-29 LAB — HEMOGLOBIN A1C
Est. average glucose Bld gHb Est-mCnc: 105 mg/dL
Hgb A1c MFr Bld: 5.3 % (ref 4.8–5.6)

## 2023-02-01 ENCOUNTER — Encounter: Payer: Self-pay | Admitting: Internal Medicine

## 2023-02-01 ENCOUNTER — Ambulatory Visit (INDEPENDENT_AMBULATORY_CARE_PROVIDER_SITE_OTHER): Payer: PPO | Admitting: Internal Medicine

## 2023-02-01 VITALS — BP 135/86 | HR 70 | Temp 98.3°F | Ht 67.0 in | Wt 264.5 lb

## 2023-02-01 DIAGNOSIS — E119 Type 2 diabetes mellitus without complications: Secondary | ICD-10-CM | POA: Diagnosis not present

## 2023-02-01 DIAGNOSIS — E782 Mixed hyperlipidemia: Secondary | ICD-10-CM

## 2023-02-01 DIAGNOSIS — Z0130 Encounter for examination of blood pressure without abnormal findings: Secondary | ICD-10-CM

## 2023-02-01 LAB — POCT CBG (FASTING - GLUCOSE)-MANUAL ENTRY: Glucose Fasting, POC: 109 mg/dL — AB (ref 70–99)

## 2023-02-01 MED ORDER — RYBELSUS 14 MG PO TABS: 1.0000 | ORAL_TABLET | Freq: Every day | ORAL | 0 refills | Status: AC

## 2023-02-01 NOTE — Telephone Encounter (Signed)
[employer] - Prior Authorization NOT required  Pharmacy Benefit - Prior Authorization REQUIRED

## 2023-02-01 NOTE — Progress Notes (Signed)
Established Patient Office Visit  Subjective:  Patient ID: Kelly Farley, female    DOB: 01-01-1956  Age: 68 y.o. MRN: [id number]  Chief Complaint  Patient presents with   Follow-up    3 month follow up    No new complaints, here for lab review and medication refills. Labs reviewed and notable for well controlled diabetes, A1c at target, lipids at target with cmp notable for stable ckd. Denies any hypoglycemic episodes and home bg readings have been at target.    No other concerns at this time.   Past Medical History:  Diagnosis Date   Arthritis    Diabetes mellitus without complication (HCC)    Hyperlipemia    Hypertension    Other abnormal findings in urine 07/05/2021   Pain in right knee 02/14/2012   Palpitations    wore holter monitor 30 years ago; murmur no longer an issue per patient   Sleep apnea    Sweats, menopausal 10/20/2015    Past Surgical History:  Procedure Laterality Date   ABDOMINAL HYSTERECTOMY     total   BREAST BIOPSY Left 04/03/2022   stereo bx, calcs, RIBBON clip-BENIGN MAMMARY PARENCHYMA WITH FIBROADENOMATOID CHANGES AND ASSOCIATED COARSE DYSTROPHIC CALCIFICATIONS.   BREAST BIOPSY Left 04/03/2022   MM LT BREAST BX W LOC DEV 1ST LESION IMAGE BX SPEC STEREO GUIDE 04/03/2022 [facility]   COLONOSCOPY WITH PROPOFOL N/A 08/18/2020   Procedure: COLONOSCOPY WITH PROPOFOL;  Surgeon: Pasty Spillers, MD;  Location: [facility];  Service: Endoscopy;  Laterality: N/A;   EYE SURGERY     JOINT REPLACEMENT  01/09/2012   right knee   TRANSFORAMINAL LUMBAR INTERBODY FUSION (TLIF) WITH PEDICLE SCREW FIXATION 2 LEVEL N/A 12/19/2021   Procedure: Lumbar Four- Five Lumbar Five-Sacral One Open laminectomy/Transforaminal Lumbar Interbody Fusion/Posterolateral instrumented fusion;  Surgeon: Jadene Pierini, MD;  Location: [facility];  Service: Neurosurgery;  Laterality: N/A;  RM 21/3C/TO FOLLOW    Social History   Socioeconomic History   Marital status:  Single    Spouse name: Not on file   Number of children: Not on file   Years of education: Not on file   Highest education level: Not on file  Occupational History   Not on file  Tobacco Use   Smoking status: Never   Smokeless tobacco: Not on file  Vaping Use   Vaping status: Never Used  Substance and Sexual Activity   Alcohol use: No   Drug use: Never   Sexual activity: Not on file  Other Topics Concern   Not on file  Social History Narrative   Not on file   Social Drivers of Health   Financial Resource Strain: Not on file  Food Insecurity: Not on file  Transportation Needs: Not on file  Physical Activity: Not on file  Stress: Not on file  Social Connections: Not on file  Intimate Partner Violence: Not on file    Family History  Problem Relation Age of Onset   Colon cancer Mother    Breast cancer Maternal Aunt     Allergies  Allergen Reactions   Soy Allergy (Do Not Select) Hives and Swelling    Throat swelling    Peanut-Containing Drug Products Swelling and Rash    Outpatient Medications Prior to Visit  Medication Sig   [DISCONTINUED] RYBELSUS 14 MG TABS Take 1 tablet by mouth daily.   acetaminophen (TYLENOL) 500 MG tablet Take 1,000 mg by mouth every 6 (six) hours as needed for moderate pain.  albuterol (VENTOLIN HFA) 108 (90 Base) MCG/ACT inhaler Inhale 2 puffs into the lungs every 6 (six) hours as needed for wheezing or shortness of breath.   allopurinol (ZYLOPRIM) 100 MG tablet Take 1 tablet (100 mg total) by mouth daily.   amLODipine (NORVASC) 10 MG tablet Take 10 mg by mouth daily. (Patient not taking: Reported on 02/01/2023)   aspirin 81 MG chewable tablet Chew 81 mg by mouth once.   cetirizine (ZYRTEC) 10 MG tablet Take 10 mg by mouth daily as needed for allergies.   colchicine 0.6 MG tablet Take 1 tablet (0.6 mg total) by mouth as directed. Initially take 1 tablet followed by a second one 6 hrs later then one daily till pain free   Cyanocobalamin  (B-12 PO) Take 500 mcg by mouth daily.   fluticasone (FLONASE) 50 MCG/ACT nasal spray Place 1 spray into both nostrils daily as needed for allergies or rhinitis.   hydrALAZINE (APRESOLINE) 25 MG tablet TAKE 1 TABLET BY MOUTH THREE TIMES A DAY   hydrochlorothiazide (HYDRODIURIL) 25 MG tablet Take 1 tablet (25 mg total) by mouth daily.   metoprolol succinate (TOPROL-XL) 100 MG 24 hr tablet TAKE 1 TABLET BY MOUTH EVERY DAY IN THE MORNING   Multiple Vitamins-Minerals (CENTRUM SILVER ULTRA WOMENS) TABS Take 1 tablet by mouth daily.   NON FORMULARY Pt uses a cpap nightly   olmesartan (BENICAR) 40 MG tablet Take 1 tablet (40 mg total) by mouth daily.   OVER THE COUNTER MEDICATION Apply 1 Application topically daily as needed (nerve pain in legs). CBD Cream (Patient not taking: Reported on 03/19/2022)   pravastatin (PRAVACHOL) 40 MG tablet TAKE 1 TABLET BY MOUTH EVERY DAY IN THE MORNING   No facility-administered medications prior to visit.    Review of Systems  Constitutional:  Positive for weight loss (3 lbs).       With diet and exercise  HENT: Negative.    Eyes: Negative.   Respiratory: Negative.    Cardiovascular: Negative.   Gastrointestinal: Negative.   Genitourinary: Negative.   Musculoskeletal:        Mild  Skin: Negative.   Neurological: Negative.        Numbness in right leg which is improving  Endo/Heme/Allergies: Negative.        Objective:   BP 135/86   Pulse 70   Temp 98.3 F (36.8 C)   Ht 5\' 7"  (1.702 m)   Wt 264 lb 8 oz (120 kg)   SpO2 99%   BMI 41.43 kg/m   Vitals:   02/01/23 1058  BP: 135/86  Pulse: 70  Temp: 98.3 F (36.8 C)  Height: 5\' 7"  (1.702 m)  Weight: 264 lb 8 oz (120 kg)  SpO2: 99%  BMI (Calculated): 41.42    Physical Exam Vitals reviewed.  Constitutional:      General: She is not in acute distress.    Appearance: She is obese.  HENT:     Head: Normocephalic.     Nose: Nose normal.     Mouth/Throat:     Mouth: Mucous membranes are  moist.  Eyes:     Extraocular Movements: Extraocular movements intact.     Pupils: Pupils are equal, round, and reactive to light.  Cardiovascular:     Rate and Rhythm: Normal rate and regular rhythm.     Heart sounds: No murmur heard. Pulmonary:     Effort: Pulmonary effort is normal.     Breath sounds: No rhonchi or rales.  Abdominal:  General: Abdomen is flat.     Palpations: There is no hepatomegaly, splenomegaly or mass.  Musculoskeletal:        General: Normal range of motion.     Cervical back: Normal range of motion. No tenderness.  Skin:    General: Skin is warm and dry.  Neurological:     General: No focal deficit present.     Mental Status: She is alert and oriented to person, place, and time.     Cranial Nerves: No cranial nerve deficit.     Motor: No weakness.  Psychiatric:        Mood and Affect: Mood normal.        Behavior: Behavior normal.      Results for orders placed or performed in visit on 02/01/23  POCT CBG (Fasting - Glucose)  Result Value Ref Range   Glucose Fasting, POC 109 (A) 70 - 99 mg/dL    Recent Results (from the past 2160 hours)  Comprehensive metabolic panel     Status: Abnormal   Collection Time: 01/28/23 10:36 AM  Result Value Ref Range   Glucose 94 70 - 99 mg/dL   BUN 19 8 - 27 mg/dL   Creatinine, Ser 1.61 (H) 0.57 - 1.00 mg/dL   eGFR 54 (L) >09 UE/AVW/0.98   BUN/Creatinine Ratio 17 12 - 28   Sodium 142 134 - 144 mmol/L   Potassium 4.2 3.5 - 5.2 mmol/L   Chloride 100 96 - 106 mmol/L   CO2 26 20 - 29 mmol/L   Calcium 10.2 8.7 - 10.3 mg/dL   Total Protein 6.6 6.0 - 8.5 g/dL   Albumin 4.0 3.9 - 4.9 g/dL   Globulin, Total 2.6 1.5 - 4.5 g/dL   Bilirubin Total 0.4 0.0 - 1.2 mg/dL   Alkaline Phosphatase 78 44 - 121 IU/L   AST 31 0 - 40 IU/L   ALT 20 0 - 32 IU/L  Lipid panel     Status: None   Collection Time: 01/28/23 10:36 AM  Result Value Ref Range   Cholesterol, Total 159 100 - 199 mg/dL   Triglycerides 65 0 - 149 mg/dL    HDL 58 >11 mg/dL   VLDL Cholesterol Cal 13 5 - 40 mg/dL   LDL Chol Calc (NIH) 88 0 - 99 mg/dL   Chol/HDL Ratio 2.7 0.0 - 4.4 ratio    Comment:                                   T. Chol/HDL Ratio                                             Men  Women                               1/2 Avg.Risk  3.4    3.3                                   Avg.Risk  5.0    4.4  2X Avg.Risk  9.6    7.1                                3X Avg.Risk 23.4   11.0   Hemoglobin A1c     Status: None   Collection Time: 01/28/23 10:36 AM  Result Value Ref Range   Hgb A1c MFr Bld 5.3 4.8 - 5.6 %    Comment:          Prediabetes: 5.7 - 6.4          Diabetes: >6.4          Glycemic control for adults with diabetes: <7.0    Est. average glucose Bld gHb Est-mCnc 105 mg/dL  POCT CBG (Fasting - Glucose)     Status: Abnormal   Collection Time: 02/01/23 11:31 AM  Result Value Ref Range   Glucose Fasting, POC 109 (A) 70 - 99 mg/dL      Assessment & Plan:  As per problem list. The current medical regimen is effective;  continue present plan and medications.  Problem List Items Addressed This Visit       Other   Mixed hyperlipidemia   Other Visit Diagnoses       Controlled type 2 diabetes mellitus without complication, without long-term current use of insulin (HCC)    -  Primary   Relevant Medications   RYBELSUS 14 MG TABS   Other Relevant Orders   POCT CBG (Fasting - Glucose) (Completed)       Return in about 3 months (around 05/02/2023) for fu with labs prior.   Total time spent: 20 minutes  Luna Fuse, MD  02/01/2023   This document may have been prepared by [facility] Voice Recognition software and as such may include unintentional dictation errors.

## 2023-02-04 NOTE — Telephone Encounter (Signed)
Notes have not been received from previous provider to attempt Prior Auth through Pharmacy benefits.

## 2023-02-04 NOTE — Telephone Encounter (Signed)
Patient called to follow up on the status of the PA.

## 2023-02-04 NOTE — Telephone Encounter (Signed)
Zilretta for LEFT knee OA   [employer]  Primary Insurance: [employer] Medicare Adv Co-pay: $15 Co-insurance: 20% Deductible: does not apply Prior Auth: NOT required  Pharmacy Benefit - insufficient records available to obtain authorization through pharmacy benefit.    Knee Injection History

## 2023-02-05 NOTE — Telephone Encounter (Signed)
Ordered. Holding for stock to arrive. Patient is aware.

## 2023-02-12 NOTE — Telephone Encounter (Signed)
Scheduled

## 2023-02-14 ENCOUNTER — Ambulatory Visit: Payer: PPO | Admitting: Family Medicine

## 2023-02-14 NOTE — Progress Notes (Deleted)
   LILLETTE Ileana Collet, PhD, LAT, ATC acting as a scribe for Artist Lloyd, MD.  Kelly Farley is a 68 y.o. female who presents to [facility] at [facility] today for f/u L knee pain. Pt was last seen by Dr. Lloyd on 01/28/23 and was advised we would work to obtain medical records from [facility] and auth Zilretta .  Today, pt reports ***   Pertinent review of systems: ***  Relevant historical information: ***   Exam:  There were no vitals taken for this visit. General: Well Developed, well nourished, and in no acute distress.   MSK: ***    Lab and Radiology Results No results found for this or any previous visit (from the past 72 hours). No results found.     Assessment and Plan: 68 y.o. female with ***   PDMP not reviewed this encounter. No orders of the defined types were placed in this encounter.  No orders of the defined types were placed in this encounter.    Discussed warning signs or symptoms. Please see discharge instructions. Patient expresses understanding.   ***

## 2023-02-14 NOTE — Progress Notes (Signed)
  Zilretta  injection left knee Procedure: Real-time Ultrasound Guided Injection of left knee joint superior lateral patellar space Device: Philips Affiniti 50G Images permanently stored and available for review in PACS Verbal informed consent obtained.  Discussed risks and benefits of procedure. Warned about infection, hyperglycemia bleeding, damage to structures among others. Patient expresses understanding and agreement Time-out conducted.   Noted no overlying erythema, induration, or other signs of local infection.   Skin prepped in a sterile fashion.   Local anesthesia: Topical Ethyl chloride.   With sterile technique and under real time ultrasound guidance: Zilretta  32 mg injected into knee joint. Fluid seen entering the joint capsule.   Completed without difficulty   Advised to call if fevers/chills, erythema, induration, drainage, or persistent bleeding.   Images permanently stored and available for review in the ultrasound unit.  Impression: Technically successful ultrasound guided injection.  Lot number: [id number]

## 2023-02-15 ENCOUNTER — Encounter: Payer: Self-pay | Admitting: Family Medicine

## 2023-02-15 ENCOUNTER — Ambulatory Visit: Payer: PPO | Admitting: Family Medicine

## 2023-02-15 ENCOUNTER — Other Ambulatory Visit: Payer: Self-pay

## 2023-02-15 DIAGNOSIS — M1712 Unilateral primary osteoarthritis, left knee: Secondary | ICD-10-CM

## 2023-02-15 DIAGNOSIS — G8929 Other chronic pain: Secondary | ICD-10-CM

## 2023-02-15 DIAGNOSIS — M25562 Pain in left knee: Secondary | ICD-10-CM | POA: Diagnosis not present

## 2023-02-15 MED ORDER — TRIAMCINOLONE ACETONIDE 32 MG IX SRER
32.0000 mg | Freq: Once | INTRA_ARTICULAR | Status: AC
  Administered 2023-02-15: 32 mg via INTRA_ARTICULAR

## 2023-02-15 NOTE — Patient Instructions (Addendum)
 Thank you for coming in today.   You received an injection today. Seek immediate medical attention if the joint becomes red, extremely painful, or is oozing fluid.

## 2023-02-18 NOTE — Telephone Encounter (Signed)
 Last Zilretta  injection 02/15/23 Can consider repeat injection on or after 05/11/23

## 2023-02-25 ENCOUNTER — Other Ambulatory Visit: Payer: Self-pay | Admitting: Internal Medicine

## 2023-02-25 ENCOUNTER — Telehealth: Payer: Self-pay

## 2023-02-25 DIAGNOSIS — Z1231 Encounter for screening mammogram for malignant neoplasm of breast: Secondary | ICD-10-CM

## 2023-02-25 NOTE — Telephone Encounter (Signed)
 Patient called requesting you to put in an order for her mammogram with [facility]

## 2023-03-08 ENCOUNTER — Other Ambulatory Visit: Payer: Self-pay | Admitting: Internal Medicine

## 2023-03-08 DIAGNOSIS — E785 Hyperlipidemia, unspecified: Secondary | ICD-10-CM

## 2023-03-15 ENCOUNTER — Other Ambulatory Visit: Payer: Self-pay | Admitting: Internal Medicine

## 2023-03-15 DIAGNOSIS — R921 Mammographic calcification found on diagnostic imaging of breast: Secondary | ICD-10-CM

## 2023-04-02 ENCOUNTER — Other Ambulatory Visit: Payer: Self-pay | Admitting: Internal Medicine

## 2023-04-02 DIAGNOSIS — R921 Mammographic calcification found on diagnostic imaging of breast: Secondary | ICD-10-CM

## 2023-04-02 DIAGNOSIS — R928 Other abnormal and inconclusive findings on diagnostic imaging of breast: Secondary | ICD-10-CM

## 2023-04-16 NOTE — Progress Notes (Signed)
   04/16/2023  Patient ID: Kelly Farley, female   DOB: [birth year], 68 y.o.   MRN: [id number]  Pharmacy Quality Measure Review  This patient is appearing on a report for being at risk of failing the adherence measure for diabetes medications this calendar year.   Medication: Rybelsus - at risk for filling 30ds of meds when 90ds is same cost Last fill date: 03/09/23 for 90 day supply  Insurance report was not up to date. No action needed at this time.   Sherrill Raring, PharmD Clinical Pharmacist [phone]

## 2023-04-23 ENCOUNTER — Telehealth: Payer: Self-pay

## 2023-04-23 NOTE — Telephone Encounter (Signed)
 Patient needs an appointment once medication is in stock.   Zilretta approved for left knee. No  prior authorization, medical notes or referrals needed. Patient has a [employer] HMO (C-SNP) plan with an effective date of 01/09/2023. Plan follows Medicare guidelines. Patient responsibility for [phone] (Zilretta) will be 20% with the remaining covered at 80% by the payer at the contracted rate. Patient is responsible for a $15 copay for CPT code 13086 with the remaining covered at 100% by the payer at contracted rate. Deductibles do not apply to these services. Patient has a $15 copay whether or not an office visit is billed. Only one copay applies per date of service. Patient has an out of pocket maximum of $3500 and has accumulated $115.19. If out of pocket is met, coverage goes to 100% and copays will no longer apply.  Case #: [id number] Exp: 05/21/2023

## 2023-04-23 NOTE — Telephone Encounter (Signed)
 Scheduled

## 2023-04-23 NOTE — Telephone Encounter (Signed)
 VOB initiated for Zilretta for LEFT knee OA

## 2023-05-08 ENCOUNTER — Ambulatory Visit
Admission: RE | Admit: 2023-05-08 | Discharge: 2023-05-08 | Disposition: A | Discharge status: Home / Self Care | Source: Ambulatory Visit | Attending: Internal Medicine | Admitting: Internal Medicine

## 2023-05-08 DIAGNOSIS — R921 Mammographic calcification found on diagnostic imaging of breast: Secondary | ICD-10-CM | POA: Insufficient documentation

## 2023-05-08 DIAGNOSIS — M1712 Unilateral primary osteoarthritis, left knee: Secondary | ICD-10-CM | POA: Diagnosis not present

## 2023-05-08 DIAGNOSIS — R92323 Mammographic fibroglandular density, bilateral breasts: Secondary | ICD-10-CM | POA: Diagnosis not present

## 2023-05-08 DIAGNOSIS — R928 Other abnormal and inconclusive findings on diagnostic imaging of breast: Secondary | ICD-10-CM | POA: Diagnosis not present

## 2023-05-13 ENCOUNTER — Ambulatory Visit: Payer: PPO | Admitting: Internal Medicine

## 2023-05-13 NOTE — Progress Notes (Deleted)
   Joanna Muck, PhD, LAT, ATC acting as a scribe for Garlan Juniper, MD.  Kelly Farley is a 68 y.o. female who presents to [facility] at [facility] today for exacerbation of her L knee pain. Pt was last seen by Dr. Alease Hunter on 02/15/23 and was given a repeat L knee Zilretta  injection  Today, pt reports ***  Pertinent review of systems: ***  Relevant historical information: ***   Exam:  There were no vitals taken for this visit. General: Well Developed, well nourished, and in no acute distress.   MSK: ***    Lab and Radiology Results No results found for this or any previous visit (from the past 72 hours). No results found.     Assessment and Plan: 68 y.o. female with ***   PDMP not reviewed this encounter. No orders of the defined types were placed in this encounter.  No orders of the defined types were placed in this encounter.    Discussed warning signs or symptoms. Please see discharge instructions. Patient expresses understanding.   ***

## 2023-05-14 ENCOUNTER — Ambulatory Visit: Admitting: Family Medicine

## 2023-05-21 ENCOUNTER — Other Ambulatory Visit: Payer: Self-pay

## 2023-05-21 ENCOUNTER — Ambulatory Visit: Admitting: Family Medicine

## 2023-05-21 VITALS — BP 156/92 | HR 64 | Ht 67.0 in | Wt 267.0 lb

## 2023-05-21 DIAGNOSIS — G8929 Other chronic pain: Secondary | ICD-10-CM

## 2023-05-21 DIAGNOSIS — M1712 Unilateral primary osteoarthritis, left knee: Secondary | ICD-10-CM | POA: Diagnosis not present

## 2023-05-21 DIAGNOSIS — M25562 Pain in left knee: Secondary | ICD-10-CM | POA: Diagnosis not present

## 2023-05-21 MED ORDER — TRIAMCINOLONE ACETONIDE 32 MG IX SRER
32.0000 mg | Freq: Once | INTRA_ARTICULAR | Status: AC
  Administered 2023-05-21: 32 mg via INTRA_ARTICULAR

## 2023-05-21 NOTE — Patient Instructions (Addendum)
 Thank you for coming in today.   You received an injection today. Seek immediate medical attention if the joint becomes red, extremely painful, or is oozing fluid.   I've referred you to Interventional Radiology for a consultation on the Genicular Artery Embolization procedure.  Let us  know if you don't hear from them in one week.

## 2023-05-21 NOTE — Progress Notes (Signed)
 Joanna Muck, PhD, LAT, ATC acting as a scribe for Garlan Juniper, MD.  Kelly Farley is a 68 y.o. female who presents to [facility] at [facility] today for exacerbation of her L knee pain. Pt was last seen by Dr. Alease Hunter on 02/15/23 and was given a repeat L knee Zilretta  injection  Today, pt reports L knee pain returned around mid-April. Pt is wanting to repeat the Zilretta  injection today. Pt is expecting her brother coming to town soon and a trip to the mountains to get away and relax.   She notes the previous Zilretta  injection did not quite last 3 months.  She had a terrible experience with a right total knee replacement that was complicated and required revision and had what sounds like a patellar rupture resulting in extensive amount of time in a long-leg cast.  She is very much not looking forward to repeating that adventure for her left knee.  Pertinent review of systems: No fevers or chills  Relevant historical information: Sleep apnea and diabetes.  History of right total knee replacement with significant complications.   Exam:  BP (!) 156/92   Pulse 64   Ht 5\' 7"  (1.702 m)   Wt 267 lb (121.1 kg)   SpO2 94%   BMI 41.82 kg/m  General: Well Developed, well nourished, and in no acute distress.   MSK: Left knee mild effusion normal motion with crepitation.  Intact strength.    Lab and Radiology Results   Zilretta  injection left knee Procedure: Real-time Ultrasound Guided Injection of left knee joint superior lateral patellar space Device: Philips Affiniti 50G Images permanently stored and available for review in PACS Verbal informed consent obtained.  Discussed risks and benefits of procedure. Warned about infection, hyperglycemia bleeding, damage to structures among others. Patient expresses understanding and agreement Time-out conducted.   Noted no overlying erythema, induration, or other signs of local infection.   Skin prepped in a sterile  fashion.   Local anesthesia: Topical Ethyl chloride.   With sterile technique and under real time ultrasound guidance: Zilretta  32 mg injected into knee joint. Fluid seen entering the joint capsule.   Completed without difficulty   Advised to call if fevers/chills, erythema, induration, drainage, or persistent bleeding.   Images permanently stored and available for review in the ultrasound unit.  Impression: Technically successful ultrasound guided injection.  Lot number: [id number]      Assessment and Plan: 68 y.o. female with chronic left knee pain due to DJD.  Plan for repeat Zilretta  injection today.  Does not seem as though this injection is going to provide lasting benefit and will not be a good long-term plan.  Plan for referral to interventional radiology for consultation regarding genicular artery embolization.   PDMP not reviewed this encounter. Orders Placed This Encounter  Procedures   US  LIMITED JOINT SPACE STRUCTURES LOW LEFT(NO LINKED CHARGES)    Reason for Exam (SYMPTOM  OR DIAGNOSIS REQUIRED):   left knee pain    Preferred imaging location?:   [facility]    Standing Status:   Future    Expiration Date:   05/20/2024    Scheduling Instructions:     Genicular Artery Embolization consult, L knee    Reason for Exam (SYMPTOM  OR DIAGNOSIS REQUIRED):   left knee pain    Preferred imaging location?:   [facility]   Meds ordered this encounter  Medications   Triamcinolone  Acetonide (  ZILRETTA ) intra-articular injection 32 mg     Discussed warning signs or symptoms. Please see discharge instructions. Patient expresses understanding.   The above documentation has been reviewed and is accurate and complete Garlan Juniper, M.D.

## 2023-05-22 ENCOUNTER — Other Ambulatory Visit: Payer: Self-pay | Admitting: Internal Medicine

## 2023-05-22 ENCOUNTER — Other Ambulatory Visit

## 2023-05-22 DIAGNOSIS — E782 Mixed hyperlipidemia: Secondary | ICD-10-CM | POA: Diagnosis not present

## 2023-05-22 DIAGNOSIS — E119 Type 2 diabetes mellitus without complications: Secondary | ICD-10-CM | POA: Diagnosis not present

## 2023-05-23 ENCOUNTER — Ambulatory Visit
Admission: RE | Admit: 2023-05-23 | Discharge: 2023-05-23 | Discharge status: Home / Self Care | Source: Ambulatory Visit | Attending: Family Medicine | Admitting: Family Medicine

## 2023-05-23 ENCOUNTER — Ambulatory Visit
Admission: RE | Admit: 2023-05-23 | Discharge: 2023-05-23 | Disposition: A | Discharge status: Home / Self Care | Source: Ambulatory Visit | Attending: Student | Admitting: Student

## 2023-05-23 DIAGNOSIS — M25562 Pain in left knee: Secondary | ICD-10-CM | POA: Diagnosis not present

## 2023-05-23 DIAGNOSIS — M1712 Unilateral primary osteoarthritis, left knee: Secondary | ICD-10-CM | POA: Diagnosis not present

## 2023-05-23 HISTORY — PX: IR RADIOLOGIST EVAL & MGMT: IMG5224

## 2023-05-23 LAB — COMPREHENSIVE METABOLIC PANEL WITH GFR
ALT: 19 IU/L (ref 0–32)
AST: 28 IU/L (ref 0–40)
Albumin: 4.1 g/dL (ref 3.9–4.9)
Alkaline Phosphatase: 93 IU/L (ref 44–121)
BUN/Creatinine Ratio: 19 (ref 12–28)
BUN: 18 mg/dL (ref 8–27)
Bilirubin Total: 0.3 mg/dL (ref 0.0–1.2)
CO2: 22 mmol/L (ref 20–29)
Calcium: 9.8 mg/dL (ref 8.7–10.3)
Chloride: 102 mmol/L (ref 96–106)
Creatinine, Ser: 0.95 mg/dL (ref 0.57–1.00)
Globulin, Total: 2.6 g/dL (ref 1.5–4.5)
Glucose: 110 mg/dL — ABNORMAL HIGH (ref 70–99)
Potassium: 4.2 mmol/L (ref 3.5–5.2)
Sodium: 141 mmol/L (ref 134–144)
Total Protein: 6.7 g/dL (ref 6.0–8.5)
eGFR: 64 mL/min/{1.73_m2} (ref >59)

## 2023-05-23 LAB — HEMOGLOBIN A1C
Est. average glucose Bld gHb Est-mCnc: 111 mg/dL
Hgb A1c MFr Bld: 5.5 % (ref 4.8–5.6)

## 2023-05-23 LAB — LIPID PANEL
Chol/HDL Ratio: 2.6 ratio (ref 0.0–4.4)
Cholesterol, Total: 162 mg/dL (ref 100–199)
HDL: 63 mg/dL (ref >39)
LDL Chol Calc (NIH): 84 mg/dL (ref 0–99)
Triglycerides: 82 mg/dL (ref 0–149)
VLDL Cholesterol Cal: 15 mg/dL (ref 5–40)

## 2023-05-23 NOTE — Consult Note (Signed)
 Chief Complaint: Patient was seen in consultation today for left knee pain at the request of Corey,Evan S  Referring Physician(s): Corey,Evan S  History of Present Illness: Kelly Farley is a 68 y.o. female with a history of obesity, sleep apnea, and bilateral knee degenerative osteoarthritis.  She had a right TKA several years ago and had a terrible experience.  She required two surgeries and an extended period in a long straight leg cast. She is very hesitant to consider TKA on the left.   She has been under the excellent care of Dr. Alease Hunter at [facility] and has received numerous steroid injections and Zilretta  injections.  These are becoming increasingly less effective and she now presents to discuss other treatment options.   Her pain is primarily along the medial joint, both femoral condyle and tibial plateau.  She is a diabetic and has peripheral neuropathy but no history of peripheral arterial disease.   WOMAC score 60 consistent with severe pain.   Past Medical History:  Diagnosis Date   Arthritis    Diabetes mellitus without complication (HCC)    Hyperlipemia    Hypertension    Other abnormal findings in urine 07/05/2021   Pain in right knee 02/14/2012   Palpitations    wore holter monitor 30 years ago; murmur no longer an issue per patient   Sleep apnea    Sweats, menopausal 10/20/2015    Past Surgical History:  Procedure Laterality Date   ABDOMINAL HYSTERECTOMY     total   BREAST BIOPSY Left 04/03/2022   stereo bx, calcs, RIBBON clip-BENIGN MAMMARY PARENCHYMA WITH FIBROADENOMATOID CHANGES AND ASSOCIATED COARSE DYSTROPHIC CALCIFICATIONS.   BREAST BIOPSY Left 04/03/2022   MM LT BREAST BX W LOC DEV 1ST LESION IMAGE BX SPEC STEREO GUIDE 04/03/2022 [facility]   COLONOSCOPY WITH PROPOFOL  N/A 08/18/2020   Procedure: COLONOSCOPY WITH PROPOFOL ;  Surgeon: Irby Mannan, MD;  Location: [facility];  Service: Endoscopy;  Laterality: N/A;   EYE  SURGERY     IR RADIOLOGIST EVAL & MGMT  05/23/2023   JOINT REPLACEMENT  01/09/2012   right knee   TRANSFORAMINAL LUMBAR INTERBODY FUSION (TLIF) WITH PEDICLE SCREW FIXATION 2 LEVEL N/A 12/19/2021   Procedure: Lumbar Four- Five Lumbar Five-Sacral One Open laminectomy/Transforaminal Lumbar Interbody Fusion/Posterolateral instrumented fusion;  Surgeon: Cannon Champion, MD;  Location: [facility] OR;  Service: Neurosurgery;  Laterality: N/A;  RM 21/3C/TO FOLLOW    Allergies: Soy allergy (obsolete) and Peanut-containing drug products  Medications: Prior to Admission medications   Medication Sig Start Date End Date Taking? Authorizing Provider  acetaminophen  (TYLENOL ) 500 MG tablet Take 1,000 mg by mouth every 6 (six) hours as needed for moderate pain.   Yes [provider]  albuterol  (VENTOLIN  HFA) 108 (90 Base) MCG/ACT inhaler Inhale 2 puffs into the lungs every 6 (six) hours as needed for wheezing or shortness of breath. 04/17/22  Yes Trenda Frisk, FNP  allopurinol  (ZYLOPRIM ) 100 MG tablet Take 1 tablet (100 mg total) by mouth daily. 10/24/22  Yes Shari Daughters, MD  aspirin 81 MG chewable tablet Chew 81 mg by mouth once.   Yes [provider]  cetirizine (ZYRTEC) 10 MG tablet Take 10 mg by mouth daily as needed for allergies.   Yes [provider]  colchicine  0.6 MG tablet Take 1 tablet (0.6 mg total) by mouth as directed. Initially take 1 tablet followed by a second one 6 hrs later then one daily till pain free 11/23/22 11/23/23 Yes  Tejan-Sie, S Ahmed, MD  Cyanocobalamin (B-12 PO) Take 500 mcg by mouth daily.   Yes [provider]  fluticasone  (FLONASE ) 50 MCG/ACT nasal spray Place 1 spray into both nostrils daily as needed for allergies or rhinitis.   Yes [provider]  hydrALAZINE  (APRESOLINE ) 25 MG tablet TAKE 1 TABLET BY MOUTH THREE TIMES A DAY 12/10/22  Yes Tejan-Sie, John Muzzy, MD  hydrochlorothiazide  (HYDRODIURIL ) 25 MG tablet Take 1 tablet (25  mg total) by mouth daily. 10/24/22  Yes Tejan-Sie, John Muzzy, MD  metoprolol  succinate (TOPROL -XL) 100 MG 24 hr tablet TAKE 1 TABLET BY MOUTH EVERY DAY IN THE MORNING 12/10/22  Yes Tejan-Sie, John Muzzy, MD  Multiple Vitamins-Minerals (CENTRUM SILVER ULTRA WOMENS) TABS Take 1 tablet by mouth daily.   Yes [provider]  NON FORMULARY Pt uses a cpap nightly   Yes [provider]  olmesartan  (BENICAR ) 40 MG tablet Take 1 tablet (40 mg total) by mouth daily. 10/24/22  Yes Shari Daughters, MD  OVER THE COUNTER MEDICATION Apply 1 Application topically daily as needed (nerve pain in legs). CBD Cream   Yes [provider]  pravastatin  (PRAVACHOL ) 40 MG tablet TAKE 1 TABLET BY MOUTH EVERY DAY IN THE MORNING 03/08/23  Yes Tejan-Sie, John Muzzy, MD  amLODipine  (NORVASC ) 10 MG tablet Take 10 mg by mouth daily. 09/21/22   [provider]     Family History  Problem Relation Age of Onset   Colon cancer Mother    Breast cancer Maternal Aunt     Social History   Socioeconomic History   Marital status: Single    Spouse name: Not on file   Number of children: Not on file   Years of education: Not on file   Highest education level: Not on file  Occupational History   Not on file  Tobacco Use   Smoking status: Never   Smokeless tobacco: Not on file  Vaping Use   Vaping status: Never Used  Substance and Sexual Activity   Alcohol use: No   Drug use: Never   Sexual activity: Not on file  Other Topics Concern   Not on file  Social History Narrative   Not on file   Social Drivers of Health   Financial Resource Strain: Not on file  Food Insecurity: Not on file  Transportation Needs: Not on file  Physical Activity: Not on file  Stress: Not on file  Social Connections: Not on file    Review of Systems: A 12 point ROS discussed and pertinent positives are indicated in the HPI above.  All other systems are negative.  Review of Systems  Vital Signs: BP (!) 186/86  (BP Location: Right Arm, Patient Position: Sitting, Cuff Size: Large)   Pulse 68   Temp 98.2 F (36.8 C)   Resp 16   SpO2 98%   Advance Care Plan: The advanced care plan/surrogate decision maker was discussed at the time of visit and the patient did not wish to discuss or was not able to name a surrogate decision maker or provide an advance care plan.    Physical Exam Constitutional:      General: She is not in acute distress.    Appearance: Normal appearance. She is obese.  HENT:     Head: Normocephalic and atraumatic.  Eyes:     General: No scleral icterus. Cardiovascular:     Rate and Rhythm: Normal rate.  Pulmonary:     Effort: Pulmonary effort is normal.  Abdominal:     Tenderness: There is no abdominal tenderness. There is no guarding.  Musculoskeletal:     Left knee: Tenderness present over the medial joint line.  Skin:    General: Skin is warm and dry.  Neurological:     Mental Status: She is alert and oriented to person, place, and time.  Psychiatric:        Mood and Affect: Mood normal.        Behavior: Behavior normal.       Imaging: IR Radiologist Eval & Mgmt Result Date: 05/23/2023 EXAM: NEW PATIENT OFFICE VISIT CHIEF COMPLAINT: SEE EPIC NOTE HISTORY OF PRESENT ILLNESS: SEE EPIC NOTE REVIEW OF SYSTEMS: SEE EPIC NOTE PHYSICAL EXAMINATION: SEE EPIC NOTE ASSESSMENT AND PLAN: SEE EPIC NOTE Electronically Signed   By: Fernando Hoyer M.D.   On: 05/23/2023 11:11   MM 3D DIAGNOSTIC MAMMOGRAM BILATERAL BREAST Result Date: 05/08/2023 CLINICAL DATA:  BI-RADS 3 follow-up of LEFT breast calcifications, initiated March 2024. Patient was callback for LEFT breast calcifications in March 2024. This group underwent stereotactic guided biopsy and demonstrated benign results of fibroadenomatoid changes with dystrophic calcifications. Note was made of a second smaller group of calcifications, identified on the spot magnification views. Patient presents for follow-up of these non  biopsied calcifications EXAM: DIGITAL DIAGNOSTIC BILATERAL MAMMOGRAM WITH TOMOSYNTHESIS AND CAD TECHNIQUE: Bilateral digital diagnostic mammography and breast tomosynthesis was performed. The images were evaluated with computer-aided detection. COMPARISON:  Previous exam(s). ACR Breast Density Category b: There are scattered areas of fibroglandular density. FINDINGS: Spot magnification views of the LEFT breast demonstrate mammographic stability of a 4 mm group of calcifications in the LEFT central breast at middle to posterior depth. RIBBON biopsy marking clip is noted, adjacent to multiple residual calcifications. No new suspicious findings are noted in the LEFT breast. No suspicious mass, distortion, or microcalcifications are identified to suggest presence of malignancy in the RIGHT breast. IMPRESSION: 1. Stable probably benign LEFT breast calcifications. Recommend follow-up diagnostic mammogram in 1 year. This will establish 2 years of definitive stability. 2. No mammographic evidence of malignancy in the RIGHT breast. RECOMMENDATION: Recommend bilateral diagnostic mammogram (with RIGHT and LEFT breast ultrasound if deemed necessary) in 1 year. Patient is due for contralateral screening at this point in time. I have discussed the findings and recommendations with the patient. If applicable, a reminder letter will be sent to the patient regarding the next appointment. BI-RADS CATEGORY  3: Probably benign. Electronically Signed   By: Clancy Crimes M.D.   On: 05/08/2023 11:46    Labs:  CBC: No results for input(s): "WBC", "HGB", "HCT", "PLT" in the last 8760 hours.  COAGS: No results for input(s): "INR", "APTT" in the last 8760 hours.  BMP: Recent Labs    06/13/22 1146 10/04/22 1012 01/28/23 1036 05/22/23 1058  NA 141 141 142 141  K 4.2 4.0 4.2 4.2  CL 100 100 100 102  CO2 27 24 26 22   GLUCOSE 112* 108* 94 110*  BUN 18 19 19 18   CALCIUM 9.9 10.0 10.2 9.8  CREATININE 1.11* 1.23* 1.10*  0.95    LIVER FUNCTION TESTS: Recent Labs    06/13/22 1146 10/04/22 1012 01/28/23 1036 05/22/23 1058  BILITOT 0.3 0.4 0.4 0.3  AST 27 30 31 28   ALT 14 15 20 19   ALKPHOS 80 82 78 93  PROT 6.5 6.7 6.6 6.7  ALBUMIN 4.0 4.0 4.0 4.1    TUMOR MARKERS: No results for input(s): "AFPTM", "CEA", "CA199", "CHROMGRNA"  in the last 8760 hours.  Assessment and Plan:  Very pleasant 67 yo female with severe chronic left knee pain which has become unresponsive to corticosteroid injections and other conservative measures.  WOMAC score 60. She had a bad experience with TKA on the right knee and is very hesitant to consider a left TKA.    Clinical exam confirms focal TTP along the medial knee joint and knee radiographs show degenerative OA worse in the medial compartment.   She is a good candidate for geniculate artery embolization.   1.) Please schedule for left GAE for severe chronic medial knee pain.  Should be a candidate for antegrade access.  Plan to target 1-2 vessels.   Thank you for this interesting consult.  I greatly enjoyed meeting Mikalyn Hosking and look forward to participating in their care.  A copy of this report was sent to the requesting provider on this date.  Electronically Signed: Roxie Cord 05/23/2023, 11:53 AM   I spent a total of 40 Minutes  in face to face in clinical consultation, greater than 50% of which was counseling/coordinating care for chronic left knee pain.

## 2023-06-05 ENCOUNTER — Ambulatory Visit: Admitting: Internal Medicine

## 2023-06-05 ENCOUNTER — Ambulatory Visit: Payer: Self-pay | Admitting: Internal Medicine

## 2023-06-05 VITALS — BP 150/92 | HR 75 | Ht 67.0 in | Wt 260.6 lb

## 2023-06-05 DIAGNOSIS — E119 Type 2 diabetes mellitus without complications: Secondary | ICD-10-CM

## 2023-06-05 DIAGNOSIS — M1A9XX0 Chronic gout, unspecified, without tophus (tophi): Secondary | ICD-10-CM | POA: Diagnosis not present

## 2023-06-05 DIAGNOSIS — I10 Essential (primary) hypertension: Secondary | ICD-10-CM

## 2023-06-05 LAB — GLUCOSE, POCT (MANUAL RESULT ENTRY): POC Glucose: 98 mg/dL (ref 70–99)

## 2023-06-05 MED ORDER — HYDRALAZINE HCL 50 MG PO TABS: 50.0000 mg | ORAL_TABLET | Freq: Three times a day (TID) | ORAL | 0 refills | Status: DC

## 2023-06-05 MED ORDER — ALLOPURINOL 100 MG PO TABS
100.0000 mg | ORAL_TABLET | Freq: Every day | ORAL | 1 refills | Status: DC
Start: 2023-06-05 — End: 2023-12-03

## 2023-06-05 MED ORDER — HYDROCHLOROTHIAZIDE 25 MG PO TABS
25.0000 mg | ORAL_TABLET | Freq: Every day | ORAL | 1 refills | Status: DC
Start: 2023-06-05 — End: 2023-12-03

## 2023-06-05 MED ORDER — OLMESARTAN MEDOXOMIL 40 MG PO TABS: 40.0000 mg | ORAL_TABLET | Freq: Every day | ORAL | 1 refills | Status: DC

## 2023-06-05 MED ORDER — METOPROLOL SUCCINATE ER 100 MG PO TB24: 100.0000 mg | ORAL_TABLET | Freq: Every day | ORAL | 1 refills | Status: DC

## 2023-06-05 NOTE — Progress Notes (Signed)
 Established Patient Office Visit  Subjective:  Patient ID: Kelly Farley, female    DOB: Apr 17, 1955  Age: 68 y.o. MRN: [id number]  Chief Complaint  Patient presents with   Follow-up    3 month lab results    No new complaints, here for lab review and medication refills. Labs reviewed and notable for well controlled diabetes, A1c at target, lipids at target with cmp notable for normalization of Cr and eGFR. Denies any hypoglycemic episodes and home bg readings have been at target. BP elevated today and at home recently.    No other concerns at this time.   Past Medical History:  Diagnosis Date   Arthritis    Diabetes mellitus without complication (HCC)    Hyperlipemia    Hypertension    Other abnormal findings in urine 07/05/2021   Pain in right knee 02/14/2012   Palpitations    wore holter monitor 30 years ago; murmur no longer an issue per patient   Sleep apnea    Sweats, menopausal 10/20/2015    Past Surgical History:  Procedure Laterality Date   ABDOMINAL HYSTERECTOMY     total   BREAST BIOPSY Left 04/03/2022   stereo bx, calcs, RIBBON clip-BENIGN MAMMARY PARENCHYMA WITH FIBROADENOMATOID CHANGES AND ASSOCIATED COARSE DYSTROPHIC CALCIFICATIONS.   BREAST BIOPSY Left 04/03/2022   MM LT BREAST BX W LOC DEV 1ST LESION IMAGE BX SPEC STEREO GUIDE 04/03/2022 [facility]   COLONOSCOPY WITH PROPOFOL  N/A 08/18/2020   Procedure: COLONOSCOPY WITH PROPOFOL ;  Surgeon: Irby Mannan, MD;  Location: [facility];  Service: Endoscopy;  Laterality: N/A;   EYE SURGERY     IR RADIOLOGIST EVAL & MGMT  05/23/2023   JOINT REPLACEMENT  01/09/2012   right knee   TRANSFORAMINAL LUMBAR INTERBODY FUSION (TLIF) WITH PEDICLE SCREW FIXATION 2 LEVEL N/A 12/19/2021   Procedure: Lumbar Four- Five Lumbar Five-Sacral One Open laminectomy/Transforaminal Lumbar Interbody Fusion/Posterolateral instrumented fusion;  Surgeon: Cannon Champion, MD;  Location: [facility];  Service: Neurosurgery;   Laterality: N/A;  RM 21/3C/TO FOLLOW    Social History   Socioeconomic History   Marital status: Single    Spouse name: Not on file   Number of children: Not on file   Years of education: Not on file   Highest education level: Not on file  Occupational History   Not on file  Tobacco Use   Smoking status: Never   Smokeless tobacco: Not on file  Vaping Use   Vaping status: Never Used  Substance and Sexual Activity   Alcohol use: No   Drug use: Never   Sexual activity: Not on file  Other Topics Concern   Not on file  Social History Narrative   Not on file   Social Drivers of Health   Financial Resource Strain: Not on file  Food Insecurity: Not on file  Transportation Needs: Not on file  Physical Activity: Not on file  Stress: Not on file  Social Connections: Not on file  Intimate Partner Violence: Not on file    Family History  Problem Relation Age of Onset   Colon cancer Mother    Breast cancer Maternal Aunt     Allergies  Allergen Reactions   Soy Allergy (Obsolete) Hives and Swelling    Throat swelling    Peanut-Containing Drug Products Swelling and Rash    Outpatient Medications Prior to Visit  Medication Sig   acetaminophen  (TYLENOL ) 500 MG tablet Take 1,000 mg by mouth every 6 (six) hours as needed for  moderate pain.   albuterol  (VENTOLIN  HFA) 108 (90 Base) MCG/ACT inhaler Inhale 2 puffs into the lungs every 6 (six) hours as needed for wheezing or shortness of breath.   aspirin 81 MG chewable tablet Chew 81 mg by mouth once.   cetirizine (ZYRTEC) 10 MG tablet Take 10 mg by mouth daily as needed for allergies.   colchicine  0.6 MG tablet Take 1 tablet (0.6 mg total) by mouth as directed. Initially take 1 tablet followed by a second one 6 hrs later then one daily till pain free   fluticasone  (FLONASE ) 50 MCG/ACT nasal spray Place 1 spray into both nostrils daily as needed for allergies or rhinitis.   Multiple Vitamins-Minerals (CENTRUM SILVER ULTRA WOMENS)  TABS Take 1 tablet by mouth daily.   NON FORMULARY Pt uses a cpap nightly   OVER THE COUNTER MEDICATION Apply 1 Application topically daily as needed (nerve pain in legs). CBD Cream   pravastatin  (PRAVACHOL ) 40 MG tablet TAKE 1 TABLET BY MOUTH EVERY DAY IN THE MORNING   Semaglutide  (RYBELSUS ) 14 MG TABS Take 1 tablet by mouth daily.   [DISCONTINUED] allopurinol  (ZYLOPRIM ) 100 MG tablet Take 1 tablet (100 mg total) by mouth daily.   [DISCONTINUED] hydrALAZINE  (APRESOLINE ) 25 MG tablet TAKE 1 TABLET BY MOUTH THREE TIMES A DAY   [DISCONTINUED] hydrochlorothiazide  (HYDRODIURIL ) 25 MG tablet Take 1 tablet (25 mg total) by mouth daily.   [DISCONTINUED] metoprolol  succinate (TOPROL -XL) 100 MG 24 hr tablet TAKE 1 TABLET BY MOUTH EVERY DAY IN THE MORNING   [DISCONTINUED] olmesartan  (BENICAR ) 40 MG tablet Take 1 tablet (40 mg total) by mouth daily.   [DISCONTINUED] amLODipine  (NORVASC ) 10 MG tablet Take 10 mg by mouth daily. (Patient not taking: Reported on 06/05/2023)   [DISCONTINUED] Cyanocobalamin (B-12 PO) Take 500 mcg by mouth daily. (Patient not taking: Reported on 06/05/2023)   No facility-administered medications prior to visit.    Review of Systems  Constitutional:  Positive for weight loss (4 lbs).       With diet and exercise  HENT: Negative.    Eyes: Negative.   Respiratory: Negative.    Cardiovascular: Negative.   Gastrointestinal: Negative.   Genitourinary: Negative.   Musculoskeletal:        Mild  Skin: Negative.   Neurological: Negative.        Numbness in right leg which is improving  Endo/Heme/Allergies: Negative.        Objective:   BP (!) 150/92   Pulse 75   Ht 5\' 7"  (1.702 m)   Wt 260 lb 9.6 oz (118.2 kg)   SpO2 94%   BMI 40.82 kg/m   Vitals:   06/05/23 1004  BP: (!) 150/92  Pulse: 75  Height: 5\' 7"  (1.702 m)  Weight: 260 lb 9.6 oz (118.2 kg)  SpO2: 94%  BMI (Calculated): 40.81    Physical Exam Vitals reviewed.  Constitutional:      General: She is  not in acute distress.    Appearance: She is obese.  HENT:     Head: Normocephalic.     Nose: Nose normal.     Mouth/Throat:     Mouth: Mucous membranes are moist.  Eyes:     Extraocular Movements: Extraocular movements intact.     Pupils: Pupils are equal, round, and reactive to light.  Cardiovascular:     Rate and Rhythm: Normal rate and regular rhythm.     Heart sounds: No murmur heard. Pulmonary:     Effort: Pulmonary effort is  normal.     Breath sounds: No rhonchi or rales.  Abdominal:     General: Abdomen is flat.     Palpations: There is no hepatomegaly, splenomegaly or mass.  Musculoskeletal:        General: Normal range of motion.     Cervical back: Normal range of motion. No tenderness.  Skin:    General: Skin is warm and dry.  Neurological:     General: No focal deficit present.     Mental Status: She is alert and oriented to person, place, and time.     Cranial Nerves: No cranial nerve deficit.     Motor: No weakness.  Psychiatric:        Mood and Affect: Mood normal.        Behavior: Behavior normal.      Results for orders placed or performed in visit on 06/05/23  POCT Glucose (CBG)  Result Value Ref Range   POC Glucose 98 70 - 99 mg/dl    Recent Results (from the past 2160 hours)  Comprehensive metabolic panel with GFR     Status: Abnormal   Collection Time: 05/22/23 10:58 AM  Result Value Ref Range   Glucose 110 (H) 70 - 99 mg/dL   BUN 18 8 - 27 mg/dL   Creatinine, Ser 6.44 0.57 - 1.00 mg/dL   eGFR 64 >03 KV/QQV/9.56   BUN/Creatinine Ratio 19 12 - 28   Sodium 141 134 - 144 mmol/L   Potassium 4.2 3.5 - 5.2 mmol/L   Chloride 102 96 - 106 mmol/L   CO2 22 20 - 29 mmol/L   Calcium 9.8 8.7 - 10.3 mg/dL   Total Protein 6.7 6.0 - 8.5 g/dL   Albumin 4.1 3.9 - 4.9 g/dL   Globulin, Total 2.6 1.5 - 4.5 g/dL   Bilirubin Total 0.3 0.0 - 1.2 mg/dL   Alkaline Phosphatase 93 44 - 121 IU/L   AST 28 0 - 40 IU/L   ALT 19 0 - 32 IU/L  Lipid panel     Status:  None   Collection Time: 05/22/23 10:58 AM  Result Value Ref Range   Cholesterol, Total 162 100 - 199 mg/dL   Triglycerides 82 0 - 149 mg/dL   HDL 63 >38 mg/dL   VLDL Cholesterol Cal 15 5 - 40 mg/dL   LDL Chol Calc (NIH) 84 0 - 99 mg/dL   Chol/HDL Ratio 2.6 0.0 - 4.4 ratio    Comment:                                   T. Chol/HDL Ratio                                             Men  Women                               1/2 Avg.Risk  3.4    3.3                                   Avg.Risk  5.0    4.4  2X Avg.Risk  9.6    7.1                                3X Avg.Risk 23.4   11.0   Hemoglobin A1c     Status: None   Collection Time: 05/22/23 10:58 AM  Result Value Ref Range   Hgb A1c MFr Bld 5.5 4.8 - 5.6 %    Comment:          Prediabetes: 5.7 - 6.4          Diabetes: >6.4          Glycemic control for adults with diabetes: <7.0    Est. average glucose Bld gHb Est-mCnc 111 mg/dL  POCT Glucose (CBG)     Status: Normal   Collection Time: 06/05/23 10:14 AM  Result Value Ref Range   POC Glucose 98 70 - 99 mg/dl      Assessment & Plan:  As per problem list. Increase Hydralazine  to improve BP control. Problem List Items Addressed This Visit       Cardiovascular and Mediastinum   Hypertension   Relevant Medications   olmesartan  (BENICAR ) 40 MG tablet   hydrALAZINE  (APRESOLINE ) 50 MG tablet   metoprolol  succinate (TOPROL -XL) 100 MG 24 hr tablet   hydrochlorothiazide  (HYDRODIURIL ) 25 MG tablet     Endocrine   Type 2 diabetes mellitus without complications (HCC) - Primary   Relevant Medications   Semaglutide  (RYBELSUS ) 14 MG TABS   olmesartan  (BENICAR ) 40 MG tablet   Other Relevant Orders   POCT Glucose (CBG) (Completed)   Other Visit Diagnoses       Essential (primary) hypertension       Relevant Medications   olmesartan  (BENICAR ) 40 MG tablet   hydrALAZINE  (APRESOLINE ) 50 MG tablet   metoprolol  succinate (TOPROL -XL) 100 MG 24 hr tablet    hydrochlorothiazide  (HYDRODIURIL ) 25 MG tablet     Chronic gout without tophus, unspecified cause, unspecified site       Relevant Medications   allopurinol  (ZYLOPRIM ) 100 MG tablet       Return in about 3 weeks (around 06/26/2023) for BP followup.   Total time spent: 20 minutes  Arzella Bitters, MD  06/05/2023   This document may have been prepared by [facility] Voice Recognition software and as such may include unintentional dictation errors.

## 2023-06-08 ENCOUNTER — Other Ambulatory Visit: Payer: Self-pay | Admitting: Internal Medicine

## 2023-06-08 DIAGNOSIS — I10 Essential (primary) hypertension: Secondary | ICD-10-CM

## 2023-06-10 ENCOUNTER — Other Ambulatory Visit: Payer: Self-pay

## 2023-06-10 MED ORDER — RYBELSUS 14 MG PO TABS: 1.0000 | ORAL_TABLET | Freq: Every day | ORAL | 1 refills | Status: DC

## 2023-06-26 ENCOUNTER — Encounter: Payer: Self-pay | Admitting: Internal Medicine

## 2023-06-26 ENCOUNTER — Ambulatory Visit (INDEPENDENT_AMBULATORY_CARE_PROVIDER_SITE_OTHER): Admitting: Internal Medicine

## 2023-06-26 VITALS — BP 131/78 | HR 68 | Temp 98.1°F | Ht 67.0 in | Wt 262.0 lb

## 2023-06-26 DIAGNOSIS — E669 Obesity, unspecified: Secondary | ICD-10-CM | POA: Diagnosis not present

## 2023-06-26 DIAGNOSIS — E785 Hyperlipidemia, unspecified: Secondary | ICD-10-CM | POA: Diagnosis not present

## 2023-06-26 DIAGNOSIS — M1000 Idiopathic gout, unspecified site: Secondary | ICD-10-CM | POA: Diagnosis not present

## 2023-06-26 DIAGNOSIS — N1832 Chronic kidney disease, stage 3b: Secondary | ICD-10-CM | POA: Diagnosis not present

## 2023-06-26 DIAGNOSIS — G4733 Obstructive sleep apnea (adult) (pediatric): Secondary | ICD-10-CM | POA: Diagnosis not present

## 2023-06-26 DIAGNOSIS — E1122 Type 2 diabetes mellitus with diabetic chronic kidney disease: Secondary | ICD-10-CM | POA: Diagnosis not present

## 2023-06-26 DIAGNOSIS — G4486 Cervicogenic headache: Secondary | ICD-10-CM

## 2023-06-26 DIAGNOSIS — E119 Type 2 diabetes mellitus without complications: Secondary | ICD-10-CM | POA: Diagnosis not present

## 2023-06-26 DIAGNOSIS — I10 Essential (primary) hypertension: Secondary | ICD-10-CM

## 2023-06-26 LAB — POCT CBG (FASTING - GLUCOSE)-MANUAL ENTRY: Glucose Fasting, POC: 106 mg/dL — AB (ref 70–99)

## 2023-06-26 NOTE — Progress Notes (Signed)
 Established Patient Office Visit  Subjective:  Patient ID: Kelly Farley, female    DOB: 1955/10/13  Age: 68 y.o. MRN: [id number]  Chief Complaint  Patient presents with   Follow-up    Bp follow up. Has been having headaches. Wants BP rechecked on left arm because it reads different than right at home.     BP has improved on review of her home bp readings. C/o headaches when she wakes up with neck pain over the last few weeks.  Relieved by otc tylenol .    No other concerns at this time.   Past Medical History:  Diagnosis Date   Arthritis    Diabetes mellitus without complication (HCC)    Hyperlipemia    Hypertension    Other abnormal findings in urine 07/05/2021   Pain in right knee 02/14/2012   Palpitations    wore holter monitor 30 years ago; murmur no longer an issue per patient   Sleep apnea    Sweats, menopausal 10/20/2015    Past Surgical History:  Procedure Laterality Date   ABDOMINAL HYSTERECTOMY     total   BREAST BIOPSY Left 04/03/2022   stereo bx, calcs, RIBBON clip-BENIGN MAMMARY PARENCHYMA WITH FIBROADENOMATOID CHANGES AND ASSOCIATED COARSE DYSTROPHIC CALCIFICATIONS.   BREAST BIOPSY Left 04/03/2022   MM LT BREAST BX W LOC DEV 1ST LESION IMAGE BX SPEC STEREO GUIDE 04/03/2022 [facility]   COLONOSCOPY WITH PROPOFOL  N/A 08/18/2020   Procedure: COLONOSCOPY WITH PROPOFOL ;  Surgeon: Irby Mannan, MD;  Location: [facility];  Service: Endoscopy;  Laterality: N/A;   EYE SURGERY     IR RADIOLOGIST EVAL & MGMT  05/23/2023   JOINT REPLACEMENT  01/09/2012   right knee   TRANSFORAMINAL LUMBAR INTERBODY FUSION (TLIF) WITH PEDICLE SCREW FIXATION 2 LEVEL N/A 12/19/2021   Procedure: Lumbar Four- Five Lumbar Five-Sacral One Open laminectomy/Transforaminal Lumbar Interbody Fusion/Posterolateral instrumented fusion;  Surgeon: Cannon Champion, MD;  Location: [facility];  Service: Neurosurgery;  Laterality: N/A;  RM 21/3C/TO FOLLOW    Social History    Socioeconomic History   Marital status: Single    Spouse name: Not on file   Number of children: Not on file   Years of education: Not on file   Highest education level: Not on file  Occupational History   Not on file  Tobacco Use   Smoking status: Never   Smokeless tobacco: Not on file  Vaping Use   Vaping status: Never Used  Substance and Sexual Activity   Alcohol use: No   Drug use: Never   Sexual activity: Not on file  Other Topics Concern   Not on file  Social History Narrative   Not on file   Social Drivers of Health   Financial Resource Strain: Not on file  Food Insecurity: Not on file  Transportation Needs: Not on file  Physical Activity: Not on file  Stress: Not on file  Social Connections: Not on file  Intimate Partner Violence: Not on file    Family History  Problem Relation Age of Onset   Colon cancer Mother    Breast cancer Maternal Aunt     Allergies  Allergen Reactions   Soy Allergy (Obsolete) Hives and Swelling    Throat swelling    Peanut-Containing Drug Products Swelling and Rash    Outpatient Medications Prior to Visit  Medication Sig   acetaminophen  (TYLENOL ) 500 MG tablet Take 1,000 mg by mouth every 6 (six) hours as needed for moderate pain.   albuterol  (  VENTOLIN  HFA) 108 (90 Base) MCG/ACT inhaler Inhale 2 puffs into the lungs every 6 (six) hours as needed for wheezing or shortness of breath.   allopurinol  (ZYLOPRIM ) 100 MG tablet Take 1 tablet (100 mg total) by mouth daily.   aspirin 81 MG chewable tablet Chew 81 mg by mouth once.   cetirizine (ZYRTEC) 10 MG tablet Take 10 mg by mouth daily as needed for allergies.   fluticasone  (FLONASE ) 50 MCG/ACT nasal spray Place 1 spray into both nostrils daily as needed for allergies or rhinitis.   hydrALAZINE  (APRESOLINE ) 50 MG tablet Take 1 tablet (50 mg total) by mouth 3 (three) times daily.   hydrochlorothiazide  (HYDRODIURIL ) 25 MG tablet Take 1 tablet (25 mg total) by mouth daily.    metoprolol  succinate (TOPROL -XL) 100 MG 24 hr tablet Take 1 tablet (100 mg total) by mouth daily. Take with or immediately following a meal.   Multiple Vitamins-Minerals (CENTRUM SILVER ULTRA WOMENS) TABS Take 1 tablet by mouth daily.   NON FORMULARY Pt uses a cpap nightly   olmesartan  (BENICAR ) 40 MG tablet Take 1 tablet (40 mg total) by mouth daily.   OVER THE COUNTER MEDICATION Apply 1 Application topically daily as needed (nerve pain in legs). CBD Cream   pravastatin  (PRAVACHOL ) 40 MG tablet TAKE 1 TABLET BY MOUTH EVERY DAY IN THE MORNING   Semaglutide  (RYBELSUS ) 14 MG TABS Take 1 tablet (14 mg total) by mouth daily.   colchicine  0.6 MG tablet Take 1 tablet (0.6 mg total) by mouth as directed. Initially take 1 tablet followed by a second one 6 hrs later then one daily till pain free (Patient not taking: Reported on 06/26/2023)   No facility-administered medications prior to visit.    Review of Systems  Constitutional:  Negative for weight loss.  Neurological:  Positive for headaches.  All other systems reviewed and are negative.      Objective:   BP 131/78   Pulse 68   Temp 98.1 F (36.7 C)   Ht 5' 7 (1.702 m)   Wt 262 lb (118.8 kg)   SpO2 99%   BMI 41.04 kg/m   Vitals:   06/26/23 1115  BP: 131/78  Pulse: 68  Temp: 98.1 F (36.7 C)  Height: 5' 7 (1.702 m)  Weight: 262 lb (118.8 kg)  SpO2: 99%  BMI (Calculated): 41.03    Physical Exam Vitals reviewed.  Constitutional:      General: She is not in acute distress.    Appearance: She is obese.  HENT:     Head: Normocephalic.     Nose: Nose normal.     Mouth/Throat:     Mouth: Mucous membranes are moist.   Eyes:     Extraocular Movements: Extraocular movements intact.     Pupils: Pupils are equal, round, and reactive to light.    Cardiovascular:     Rate and Rhythm: Normal rate and regular rhythm.     Heart sounds: No murmur heard. Pulmonary:     Effort: Pulmonary effort is normal.     Breath sounds: No  rhonchi or rales.  Abdominal:     General: Abdomen is flat.     Palpations: There is no hepatomegaly, splenomegaly or mass.   Musculoskeletal:        General: Normal range of motion.     Cervical back: Normal range of motion. No tenderness.   Skin:    General: Skin is warm and dry.   Neurological:     General:  No focal deficit present.     Mental Status: She is alert and oriented to person, place, and time.     Cranial Nerves: No cranial nerve deficit.     Motor: No weakness.   Psychiatric:        Mood and Affect: Mood normal.        Behavior: Behavior normal.      Results for orders placed or performed in visit on 06/26/23  POCT CBG (Fasting - Glucose)  Result Value Ref Range   Glucose Fasting, POC 106 (A) 70 - 99 mg/dL        Assessment & Plan:  As per problem list. May need adjustment of cpap settings with recent weight loss.  Problem List Items Addressed This Visit       Endocrine   Type 2 diabetes mellitus without complications (HCC) - Primary   Relevant Orders   POCT CBG (Fasting - Glucose) (Completed)   Other Visit Diagnoses       Cervicogenic headache           Return in about 9 weeks (around 08/28/2023) for fu with labs prior.   Total time spent: 20 minutes  Arzella Bitters, MD  06/26/2023   This document may have been prepared by [facility] Voice Recognition software and as such may include unintentional dictation errors.

## 2023-07-01 ENCOUNTER — Other Ambulatory Visit: Payer: Self-pay

## 2023-07-01 DIAGNOSIS — I10 Essential (primary) hypertension: Secondary | ICD-10-CM

## 2023-07-01 MED ORDER — HYDRALAZINE HCL 50 MG PO TABS: 50.0000 mg | ORAL_TABLET | Freq: Three times a day (TID) | ORAL | 1 refills | Status: DC

## 2023-07-03 DIAGNOSIS — E669 Obesity, unspecified: Secondary | ICD-10-CM | POA: Diagnosis not present

## 2023-07-03 DIAGNOSIS — N1832 Chronic kidney disease, stage 3b: Secondary | ICD-10-CM | POA: Diagnosis not present

## 2023-07-03 DIAGNOSIS — E1122 Type 2 diabetes mellitus with diabetic chronic kidney disease: Secondary | ICD-10-CM | POA: Diagnosis not present

## 2023-07-03 DIAGNOSIS — I10 Essential (primary) hypertension: Secondary | ICD-10-CM | POA: Diagnosis not present

## 2023-07-03 DIAGNOSIS — G4733 Obstructive sleep apnea (adult) (pediatric): Secondary | ICD-10-CM | POA: Diagnosis not present

## 2023-07-03 DIAGNOSIS — N281 Cyst of kidney, acquired: Secondary | ICD-10-CM | POA: Diagnosis not present

## 2023-07-03 DIAGNOSIS — M1000 Idiopathic gout, unspecified site: Secondary | ICD-10-CM | POA: Diagnosis not present

## 2023-07-03 DIAGNOSIS — E785 Hyperlipidemia, unspecified: Secondary | ICD-10-CM | POA: Diagnosis not present

## 2023-08-23 ENCOUNTER — Other Ambulatory Visit

## 2023-08-23 ENCOUNTER — Telehealth: Payer: Self-pay | Admitting: Family Medicine

## 2023-08-23 DIAGNOSIS — E782 Mixed hyperlipidemia: Secondary | ICD-10-CM

## 2023-08-23 DIAGNOSIS — I10 Essential (primary) hypertension: Secondary | ICD-10-CM

## 2023-08-23 DIAGNOSIS — E119 Type 2 diabetes mellitus without complications: Secondary | ICD-10-CM

## 2023-08-23 NOTE — Telephone Encounter (Signed)
 Patient called asking if Zilretta  could be authorized again for her? She said that she has not had the Genicular Artery Embolization yes because she is not ready for that procedure at this time.

## 2023-08-24 LAB — COMPREHENSIVE METABOLIC PANEL WITH GFR
ALT: 17 IU/L (ref 0–32)
AST: 26 IU/L (ref 0–40)
Albumin: 4.1 g/dL (ref 3.9–4.9)
Alkaline Phosphatase: 79 IU/L (ref 44–121)
BUN/Creatinine Ratio: 23 (ref 12–28)
BUN: 23 mg/dL (ref 8–27)
Bilirubin Total: 0.4 mg/dL (ref 0.0–1.2)
CO2: 25 mmol/L (ref 20–29)
Calcium: 9.6 mg/dL (ref 8.7–10.3)
Chloride: 102 mmol/L (ref 96–106)
Creatinine, Ser: 1.02 mg/dL — ABNORMAL HIGH (ref 0.57–1.00)
Globulin, Total: 2.3 g/dL (ref 1.5–4.5)
Glucose: 87 mg/dL (ref 70–99)
Potassium: 3.9 mmol/L (ref 3.5–5.2)
Sodium: 140 mmol/L (ref 134–144)
Total Protein: 6.4 g/dL (ref 6.0–8.5)
eGFR: 59 mL/min/1.73 — ABNORMAL LOW (ref >59)

## 2023-08-24 LAB — HEMOGLOBIN A1C
Est. average glucose Bld gHb Est-mCnc: 105 mg/dL
Hgb A1c MFr Bld: 5.3 % (ref 4.8–5.6)

## 2023-08-24 LAB — LIPID PANEL
Chol/HDL Ratio: 2.4 ratio (ref 0.0–4.4)
Cholesterol, Total: 146 mg/dL (ref 100–199)
HDL: 61 mg/dL (ref >39)
LDL Chol Calc (NIH): 73 mg/dL (ref 0–99)
Triglycerides: 59 mg/dL (ref 0–149)
VLDL Cholesterol Cal: 12 mg/dL (ref 5–40)

## 2023-08-26 NOTE — Telephone Encounter (Signed)
 Patient re-ran for Ziltretta on 08/26/2023. Case #: [id number]. Pending approval.

## 2023-08-27 ENCOUNTER — Other Ambulatory Visit: Payer: Self-pay | Admitting: Interventional Radiology

## 2023-08-27 DIAGNOSIS — M1712 Unilateral primary osteoarthritis, left knee: Secondary | ICD-10-CM

## 2023-08-27 NOTE — Progress Notes (Signed)
   08/27/2023  Patient ID: Kelly Farley, female   DOB: 1955/09/16, 68 y.o.   MRN: [id number]  Pharmacy Quality Measure Review  This patient is appearing on a report for being at risk of failing the adherence measure for cholesterol (statin) medications this calendar year.   Medication: Pravastatin  Last fill date: 06/09/23 for 90 day supply  Insurance report was not up to date. No action needed at this time.   [name], PharmD Clinical Pharmacist [phone]

## 2023-08-27 NOTE — Telephone Encounter (Signed)
 Patient will need an appointment when medication is stocked.   Zilretta  approved for left knee. No prior authorization medical notes or needed. A referral is required from PCP Dr. Sherrill Perry. This must be submitted with the claim. Patient has a F[employer] HMO (C-[employer]) plan with an effective date of 01/09/2023. Plan follows Medicare guidelines. Patient responsibility for [phone] (Zilretta ) will be 20% with the remaining covered at 80% by the payer at the contracted rate. Patient is responsible for a $15 copay for CPT code 79388 with the remaining covered at 100% by the payer at contracted rate. Deductibles do not apply to these services. Patient has a $15 copay whether or not an office visit is billed. Only one copay applies per date of service. Patient has an out of pocket maximum of $3500 and has accumulated $270.43. If out of pocket is met, coverage goes to 100% and copays will no longer apply. The above benefits represent the coverage for J3304 (Zilretta ) based on repeat administration.  Case ID: [id number] Ref#: [id number] Exp: 02/27/2024

## 2023-08-27 NOTE — Telephone Encounter (Signed)
 Patient called and wanted to schedule an appt for the injection and I let patient know we were still waiting to hear back from insurance. Patient stated she may go ahead and schedule the catheterization but asked that we still call her when we find out from insurance. FYI

## 2023-08-27 NOTE — Telephone Encounter (Signed)
 Called patient and informed of zilretta  approval. Patient is scheduled for Thursday.  She also went ahead and scheduled the genicular artery embolization for Tuesday. She asked if the injection would effect the procedure or what would be more beneficial?

## 2023-08-28 ENCOUNTER — Telehealth: Payer: Self-pay | Admitting: Family Medicine

## 2023-08-28 ENCOUNTER — Ambulatory Visit: Payer: Self-pay | Admitting: Internal Medicine

## 2023-08-28 ENCOUNTER — Ambulatory Visit (INDEPENDENT_AMBULATORY_CARE_PROVIDER_SITE_OTHER): Admitting: Internal Medicine

## 2023-08-28 ENCOUNTER — Encounter: Payer: Self-pay | Admitting: Internal Medicine

## 2023-08-28 VITALS — BP 140/82 | HR 70 | Temp 98.1°F | Ht 67.0 in | Wt 264.6 lb

## 2023-08-28 DIAGNOSIS — E119 Type 2 diabetes mellitus without complications: Secondary | ICD-10-CM

## 2023-08-28 DIAGNOSIS — I10 Essential (primary) hypertension: Secondary | ICD-10-CM

## 2023-08-28 DIAGNOSIS — E7800 Pure hypercholesterolemia, unspecified: Secondary | ICD-10-CM | POA: Diagnosis not present

## 2023-08-28 DIAGNOSIS — M7662 Achilles tendinitis, left leg: Secondary | ICD-10-CM | POA: Diagnosis not present

## 2023-08-28 LAB — POC CREATINE & ALBUMIN,URINE
Albumin/Creatinine Ratio, Urine, POC: <30
Creatinine, POC: 50 mg/dL
Microalbumin Ur, POC: 10 mg/L

## 2023-08-28 LAB — POCT CBG (FASTING - GLUCOSE)-MANUAL ENTRY: Glucose Fasting, POC: 110 mg/dL — AB (ref 70–99)

## 2023-08-28 MED ORDER — PREDNISONE 20 MG PO TABS: 40.0000 mg | ORAL_TABLET | Freq: Every day | ORAL | 0 refills | Status: AC

## 2023-08-28 MED ORDER — PRAVASTATIN SODIUM 40 MG PO TABS: 40.0000 mg | ORAL_TABLET | Freq: Every evening | ORAL | 1 refills | Status: AC

## 2023-08-28 MED ORDER — HYDRALAZINE HCL 50 MG PO TABS: 50.0000 mg | ORAL_TABLET | Freq: Three times a day (TID) | ORAL | 1 refills | Status: DC

## 2023-08-28 NOTE — Progress Notes (Signed)
 Established Patient Office Visit  Subjective:  Patient ID: Kelly Farley, female    DOB: [birth year]  Age: 68 y.o. MRN: [id number]  Chief Complaint  Patient presents with   Follow-up    9 week follow up, discuss lab results    C/o pain and stiffness in her hands worse in the am and relieved by tylenol . Also here for lab review and medication refills. Labs reviewed and notable for well controlled diabetes, A1c at target, lipids at target with cmp notable for stable ckd. Denies any hypoglycemic episodes and home bg readings have been at target. Also c/o left heel pain and swelling at the back of her heel.     No other concerns at this time.   Past Medical History:  Diagnosis Date   Arthritis    Diabetes mellitus without complication (HCC)    Hyperlipemia    Hypertension    Other abnormal findings in urine 07/05/2021   Pain in right knee 02/14/2012   Palpitations    wore holter monitor 30 years ago; murmur no longer an issue per patient   Sleep apnea    Sweats, menopausal 10/20/2015    Past Surgical History:  Procedure Laterality Date   ABDOMINAL HYSTERECTOMY     total   BREAST BIOPSY Left 04/03/2022   stereo bx, calcs, RIBBON clip-BENIGN MAMMARY PARENCHYMA WITH FIBROADENOMATOID CHANGES AND ASSOCIATED COARSE DYSTROPHIC CALCIFICATIONS.   BREAST BIOPSY Left 04/03/2022   MM LT BREAST BX W LOC DEV 1ST LESION IMAGE BX SPEC STEREO GUIDE 04/03/2022 [facility]   COLONOSCOPY WITH PROPOFOL  N/A 08/18/2020   Procedure: COLONOSCOPY WITH PROPOFOL ;  Surgeon: Janalyn Keene NOVAK, MD;  Location: [facility];  Service: Endoscopy;  Laterality: N/A;   EYE SURGERY     IR RADIOLOGIST EVAL & MGMT  05/23/2023   JOINT REPLACEMENT  01/09/2012   right knee   TRANSFORAMINAL LUMBAR INTERBODY FUSION (TLIF) WITH PEDICLE SCREW FIXATION 2 LEVEL N/A 12/19/2021   Procedure: Lumbar Four- Five Lumbar Five-Sacral One Open laminectomy/Transforaminal Lumbar Interbody Fusion/Posterolateral instrumented  fusion;  Surgeon: Cheryle Debby LABOR, MD;  Location: [facility];  Service: Neurosurgery;  Laterality: N/A;  RM 21/3C/TO FOLLOW    Social History   Socioeconomic History   Marital status: Single    Spouse name: Not on file   Number of children: Not on file   Years of education: Not on file   Highest education level: Not on file  Occupational History   Not on file  Tobacco Use   Smoking status: Never   Smokeless tobacco: Not on file  Vaping Use   Vaping status: Never Used  Substance and Sexual Activity   Alcohol use: No   Drug use: Never   Sexual activity: Not on file  Other Topics Concern   Not on file  Social History Narrative   Not on file   Social Drivers of Health   Financial Resource Strain: Not on file  Food Insecurity: Not on file  Transportation Needs: Not on file  Physical Activity: Not on file  Stress: Not on file  Social Connections: Not on file  Intimate Partner Violence: Not on file    Family History  Problem Relation Age of Onset   Colon cancer Mother    Breast cancer Maternal Aunt     Allergies  Allergen Reactions   Soy Allergy (Obsolete) Hives and Swelling    Throat swelling    Peanut-Containing Drug Products Swelling and Rash    Outpatient Medications Prior to Visit  Medication  Sig   acetaminophen  (TYLENOL ) 500 MG tablet Take 1,000 mg by mouth every 6 (six) hours as needed for moderate pain.   albuterol  (VENTOLIN  HFA) 108 (90 Base) MCG/ACT inhaler Inhale 2 puffs into the lungs every 6 (six) hours as needed for wheezing or shortness of breath.   allopurinol  (ZYLOPRIM ) 100 MG tablet Take 1 tablet (100 mg total) by mouth daily.   aspirin 81 MG chewable tablet Chew 81 mg by mouth once.   cetirizine (ZYRTEC) 10 MG tablet Take 10 mg by mouth daily as needed for allergies.   fluticasone  (FLONASE ) 50 MCG/ACT nasal spray Place 1 spray into both nostrils daily as needed for allergies or rhinitis.   hydrochlorothiazide  (HYDRODIURIL ) 25 MG tablet Take 1  tablet (25 mg total) by mouth daily.   metoprolol  succinate (TOPROL -XL) 100 MG 24 hr tablet Take 1 tablet (100 mg total) by mouth daily. Take with or immediately following a meal.   Multiple Vitamins-Minerals (CENTRUM SILVER ULTRA WOMENS) TABS Take 1 tablet by mouth daily.   NON FORMULARY Pt uses a cpap nightly   olmesartan  (BENICAR ) 40 MG tablet Take 1 tablet (40 mg total) by mouth daily.   OVER THE COUNTER MEDICATION Apply 1 Application topically daily as needed (nerve pain in legs). CBD Cream   Semaglutide  (RYBELSUS ) 14 MG TABS Take 1 tablet (14 mg total) by mouth daily.   [DISCONTINUED] hydrALAZINE  (APRESOLINE ) 50 MG tablet Take 1 tablet (50 mg total) by mouth 3 (three) times daily.   [DISCONTINUED] pravastatin  (PRAVACHOL ) 40 MG tablet TAKE 1 TABLET BY MOUTH EVERY DAY IN THE MORNING   colchicine  0.6 MG tablet Take 1 tablet (0.6 mg total) by mouth as directed. Initially take 1 tablet followed by a second one 6 hrs later then one daily till pain free (Patient not taking: Reported on 08/28/2023)   No facility-administered medications prior to visit.    Review of Systems  Constitutional:  Negative for weight loss (4 lbs).       With diet and exercise  HENT: Negative.    Eyes: Negative.   Respiratory: Negative.    Cardiovascular: Negative.   Gastrointestinal: Negative.   Genitourinary: Negative.   Musculoskeletal:        Mild  Skin: Negative.   Neurological: Negative.        Numbness in right leg which is improving  Endo/Heme/Allergies: Negative.        Objective:   BP (!) 140/82 (Cuff Size: Large)   Pulse 70   Temp 98.1 F (36.7 C) (Tympanic)   Ht 5' 7 (1.702 m)   Wt 264 lb 9.6 oz (120 kg)   SpO2 98%   BMI 41.44 kg/m   Vitals:   08/28/23 1104 08/28/23 1130  BP: (!) 138/90 (!) 140/82  Pulse: 70   Temp: 98.1 F (36.7 C)   Height: 5' 7 (1.702 m)   Weight: 264 lb 9.6 oz (120 kg)   SpO2: 98%   TempSrc: Tympanic   BMI (Calculated): 41.43     Physical Exam Vitals  reviewed.  Constitutional:      General: She is not in acute distress.    Appearance: She is obese.  HENT:     Head: Normocephalic.     Nose: Nose normal.     Mouth/Throat:     Mouth: Mucous membranes are moist.  Eyes:     Extraocular Movements: Extraocular movements intact.     Pupils: Pupils are equal, round, and reactive to light.  Cardiovascular:  Rate and Rhythm: Normal rate and regular rhythm.     Heart sounds: No murmur heard. Pulmonary:     Effort: Pulmonary effort is normal.     Breath sounds: No rhonchi or rales.  Abdominal:     General: Abdomen is flat.     Palpations: There is no hepatomegaly, splenomegaly or mass.  Musculoskeletal:        General: Normal range of motion.     Cervical back: Normal range of motion. No tenderness.     Left ankle: Swelling (achilles) present. Tenderness present.  Skin:    General: Skin is warm and dry.  Neurological:     General: No focal deficit present.     Mental Status: She is alert and oriented to person, place, and time.     Cranial Nerves: No cranial nerve deficit.     Motor: No weakness.  Psychiatric:        Mood and Affect: Mood normal.        Behavior: Behavior normal.      Results for orders placed or performed in visit on 08/28/23  POCT CBG (Fasting - Glucose)  Result Value Ref Range   Glucose Fasting, POC 110 (A) 70 - 99 mg/dL    Recent Results (from the past 2160 hours)  POCT Glucose (CBG)     Status: Normal   Collection Time: 06/05/23 10:14 AM  Result Value Ref Range   POC Glucose 98 70 - 99 mg/dl  POCT CBG (Fasting - Glucose)     Status: Abnormal   Collection Time: 06/26/23 11:25 AM  Result Value Ref Range   Glucose Fasting, POC 106 (A) 70 - 99 mg/dL  Hemoglobin J8r     Status: None   Collection Time: 08/23/23 11:39 AM  Result Value Ref Range   Hgb A1c MFr Bld 5.3 4.8 - 5.6 %    Comment:          Prediabetes: 5.7 - 6.4          Diabetes: >6.4          Glycemic control for adults with diabetes:  <7.0    Est. average glucose Bld gHb Est-mCnc 105 mg/dL  Lipid panel     Status: None   Collection Time: 08/23/23 11:39 AM  Result Value Ref Range   Cholesterol, Total 146 100 - 199 mg/dL   Triglycerides 59 0 - 149 mg/dL   HDL 61 >60 mg/dL   VLDL Cholesterol Cal 12 5 - 40 mg/dL   LDL Chol Calc (NIH) 73 0 - 99 mg/dL   Chol/HDL Ratio 2.4 0.0 - 4.4 ratio    Comment:                                   T. Chol/HDL Ratio                                             Men  Women                               1/2 Avg.Risk  3.4    3.3  Avg.Risk  5.0    4.4                                2X Avg.Risk  9.6    7.1                                3X Avg.Risk 23.4   11.0   Comprehensive metabolic panel with GFR     Status: Abnormal   Collection Time: 08/23/23 11:39 AM  Result Value Ref Range   Glucose 87 70 - 99 mg/dL   BUN 23 8 - 27 mg/dL   Creatinine, Ser 8.97 (H) 0.57 - 1.00 mg/dL   eGFR 59 (L) >40 fO/fpw/8.26   BUN/Creatinine Ratio 23 12 - 28   Sodium 140 134 - 144 mmol/L   Potassium 3.9 3.5 - 5.2 mmol/L   Chloride 102 96 - 106 mmol/L   CO2 25 20 - 29 mmol/L   Calcium 9.6 8.7 - 10.3 mg/dL   Total Protein 6.4 6.0 - 8.5 g/dL   Albumin 4.1 3.9 - 4.9 g/dL   Globulin, Total 2.3 1.5 - 4.5 g/dL   Bilirubin Total 0.4 0.0 - 1.2 mg/dL   Alkaline Phosphatase 79 44 - 121 IU/L   AST 26 0 - 40 IU/L   ALT 17 0 - 32 IU/L  POCT CBG (Fasting - Glucose)     Status: Abnormal   Collection Time: 08/28/23 11:10 AM  Result Value Ref Range   Glucose Fasting, POC 110 (A) 70 - 99 mg/dL      Assessment & Plan:  Talasia was seen today for follow-up.  Type 2 diabetes mellitus without complication, without long-term current use of insulin  (HCC) -     POCT CBG (Fasting - Glucose) -     POC CREATINE & ALBUMIN,URINE  Primary hypertension -     hydrALAZINE  HCl; Take 1 tablet (50 mg total) by mouth 3 (three) times daily.  Dispense: 270 tablet; Refill: 1  Hyperlipidemia,  unspecified -     Pravastatin  Sodium; Take 1 tablet (40 mg total) by mouth at bedtime.  Dispense: 90 tablet; Refill: 1  Achilles tendinitis of left lower extremity -     predniSONE ; Take 2 tablets (40 mg total) by mouth daily with breakfast for 5 days.  Dispense: 10 tablet; Refill: 0    Problem List Items Addressed This Visit       Cardiovascular and Mediastinum   Hypertension   Relevant Medications   hydrALAZINE  (APRESOLINE ) 50 MG tablet   pravastatin  (PRAVACHOL ) 40 MG tablet     Endocrine   Type 2 diabetes mellitus without complications (HCC) - Primary   Relevant Medications   pravastatin  (PRAVACHOL ) 40 MG tablet   Other Relevant Orders   POCT CBG (Fasting - Glucose) (Completed)   POC CREATINE & ALBUMIN,URINE   Other Visit Diagnoses       Hyperlipidemia, unspecified       Relevant Medications   hydrALAZINE  (APRESOLINE ) 50 MG tablet   pravastatin  (PRAVACHOL ) 40 MG tablet     Achilles tendinitis of left lower extremity       Relevant Medications   predniSONE  (DELTASONE ) 20 MG tablet       Return in about 3 months (around 11/28/2023) for awv with labs prior.   Total time spent: 20 minutes  Sherrill Cinderella Perry, MD  08/28/2023   This  document may have been prepared by [employer] Voice Recognition software and as such may include unintentional dictation errors.

## 2023-08-28 NOTE — Progress Notes (Deleted)
   LILLETTE Ileana Collet, PhD, LAT, ATC acting as a scribe for Artist Lloyd, MD.  Deanne Bedgood is a 68 y.o. female who presents to [facility] today for cont'd L knee pain. Pt was last seen by Dr. Lloyd on 05/21/23 and her L knee was injected w/ Zilretta . She was also referred for a GAE, scheduled for 8/26.  Today, pt reports ***  Dx testing: 05/23/23 L knee XR  Pertinent review of systems: ***  Relevant historical information: ***   Exam:  There were no vitals taken for this visit. General: Well Developed, well nourished, and in no acute distress.   MSK: ***    Lab and Radiology Results No results found for this or any previous visit (from the past 72 hours). No results found.     Assessment and Plan: 69 y.o. female with ***   PDMP not reviewed this encounter. No orders of the defined types were placed in this encounter.  No orders of the defined types were placed in this encounter.    Discussed warning signs or symptoms. Please see discharge instructions. Patient expresses understanding.   ***

## 2023-08-28 NOTE — Telephone Encounter (Signed)
 Pt has GAE scheduled for 8/26, went to her PCP today and they rx'd prednisone  for other issues she is having.  Pt wondering if it is OK for her to take the steroids with the procedure scheduled for next week.

## 2023-08-29 ENCOUNTER — Ambulatory Visit: Admitting: Family Medicine

## 2023-09-02 NOTE — Discharge Instructions (Signed)
 Discharge Instructions for Genicular Artery Embolization (GAE)   Post-Procedure Care   Activity:   Rest for the remainder of the day.   Avoid strenuous activities and heavy lifting for 48 hours.   Gradually resume normal activities as tolerated.   Pain Management:   You may experience mild pain or discomfort at the catheter insertion site or in the knee. This is normal.   Use over-the-counter pain relievers such as acetaminophen (Tylenol) or ibuprofen (Advil) as directed.   Apply an ice pack to the knee for 15-20 minutes every 2-3 hours to reduce swelling and discomfort.   Take Sol-Medrol Pack as directed per pharmacy.   Wound Care:   Keep the catheter insertion site clean and dry.   Remove the bandage after 24 hours and replace it with a clean, dry bandage if needed.   Avoid soaking in baths, hot tubs, or swimming pools for 5 days. Showers are allowed.   Medications:   Take prescribed medications as directed.   If you were taking blood thinners, follow your physician's instructions on when to resume them.   Diet:   Resume your normal diet.   Drink plenty of fluids to stay hydrated.   Follow-Up:   Schedule a follow-up appointment with your physician as instructed.   Contact your physician if you experience increased pain, swelling, redness, or drainage at the insertion site, or if you have a fever over 100.33F (38C).   When to Seek Immediate Medical Attention   Call [phone] with any concerns:   Signs of infection at the catheter site (redness, warmth, pus).   Sudden weakness or numbness in the leg.   If you need to speak to someone after hours 5:00pm, please call the on call IR MD at [phone]. Tell them you are a patient of Dr. Archer Asa and you had a GAE today along with any issues you are having.      Call 911 if:   Difficulty breathing or chest pain.   You have severe pain in your abdomen, and it does not get better with medicine.      You have leg pain or leg swelling.   You feel dizzy, or you faint.   Do not wait to see if the symptoms will go away.   Do not drive yourself to the hospital.   Please ensure you follow these instructions carefully and reach out to your healthcare provider if you have any concerns or questions. Wishing you a smooth and speedy recovery!

## 2023-09-03 ENCOUNTER — Inpatient Hospital Stay
Admission: RE | Admit: 2023-09-03 | Discharge: 2023-09-03 | Disposition: A | Discharge status: Home / Self Care | Source: Ambulatory Visit | Attending: Interventional Radiology | Admitting: Interventional Radiology

## 2023-09-03 NOTE — Progress Notes (Signed)
   09/03/2023  Patient ID: Kelly Farley, female   DOB: 05/11/1955, 68 y.o.   MRN: [id number]  Pharmacy Quality Measure Review  This patient is appearing on a report for being at risk of failing the adherence measure for diabetes and hypertension (ACEi/ARB) medications this calendar year.   Medication: Olmesartan  Last fill date: 06/27/23 for 90 day supply  Medication: Rybelsus  Last fill date: 06/10/23 for 90 day supply  Insurance report was not up to date. No action needed at this time.   [name], PharmD Clinical Pharmacist [phone]

## 2023-09-10 NOTE — Progress Notes (Signed)
   09/10/2023  Patient ID: Kelly Farley, female   DOB: 23-Nov-1955, 68 y.o.   MRN: [id number]  Pharmacy Quality Measure Review  This patient is appearing on a report for being at risk of failing the adherence measure for cholesterol (statin) medications this calendar year.   Medication: Pravastatin  40mg  Last fill date: 09/03/23 for 90 day supply  Insurance report was not up to date. No action needed at this time.   [name], PharmD Clinical Pharmacist [phone]

## 2023-09-23 ENCOUNTER — Telehealth: Payer: Self-pay

## 2023-09-23 NOTE — Discharge Instructions (Signed)
 Discharge Instructions for Genicular Artery Embolization (GAE)   Post-Procedure Care   Activity:   Rest for the remainder of the day.   Avoid strenuous activities and heavy lifting for 48 hours.   Gradually resume normal activities as tolerated.   Pain Management:   You may experience mild pain or discomfort at the catheter insertion site or in the knee. This is normal.   Use over-the-counter pain relievers such as acetaminophen (Tylenol) or ibuprofen (Advil) as directed.   Apply an ice pack to the knee for 15-20 minutes every 2-3 hours to reduce swelling and discomfort.   Take Sol-Medrol Pack as directed per pharmacy.   Wound Care:   Keep the catheter insertion site clean and dry.   Remove the bandage after 24 hours and replace it with a clean, dry bandage if needed.   Avoid soaking in baths, hot tubs, or swimming pools for 5 days. Showers are allowed.   Medications:   Take prescribed medications as directed.   If you were taking blood thinners, follow your physician's instructions on when to resume them.   Diet:   Resume your normal diet.   Drink plenty of fluids to stay hydrated.   Follow-Up:   Schedule a follow-up appointment with your physician as instructed.   Contact your physician if you experience increased pain, swelling, redness, or drainage at the insertion site, or if you have a fever over 100.33F (38C).   When to Seek Immediate Medical Attention   Call [phone] with any concerns:   Signs of infection at the catheter site (redness, warmth, pus).   Sudden weakness or numbness in the leg.   If you need to speak to someone after hours 5:00pm, please call the on call IR MD at [phone]. Tell them you are a patient of Dr. Archer Asa and you had a GAE today along with any issues you are having.      Call 911 if:   Difficulty breathing or chest pain.   You have severe pain in your abdomen, and it does not get better with medicine.      You have leg pain or leg swelling.   You feel dizzy, or you faint.   Do not wait to see if the symptoms will go away.   Do not drive yourself to the hospital.   Please ensure you follow these instructions carefully and reach out to your healthcare provider if you have any concerns or questions. Wishing you a smooth and speedy recovery!

## 2023-09-24 ENCOUNTER — Ambulatory Visit
Admission: RE | Admit: 2023-09-24 | Discharge: 2023-09-24 | Disposition: A | Discharge status: Home / Self Care | Source: Ambulatory Visit | Attending: Interventional Radiology | Admitting: Interventional Radiology

## 2023-09-24 DIAGNOSIS — M25562 Pain in left knee: Secondary | ICD-10-CM | POA: Diagnosis not present

## 2023-09-24 DIAGNOSIS — M1712 Unilateral primary osteoarthritis, left knee: Secondary | ICD-10-CM

## 2023-09-24 HISTORY — PX: IR EMBO ARTERIAL NOT HEMORR HEMANG INC GUIDE ROADMAPPING: IMG5448

## 2023-09-24 MED ORDER — ACETAMINOPHEN 10 MG/ML IV SOLN
1000.0000 mg | Freq: Once | INTRAVENOUS | Status: AC
  Administered 2023-09-24: 1000 mg via INTRAVENOUS

## 2023-09-24 MED ORDER — KETOROLAC TROMETHAMINE 30 MG/ML IJ SOLN
30.0000 mg | INTRAMUSCULAR | Status: AC
  Administered 2023-09-24: 30 mg via INTRAVENOUS

## 2023-09-24 MED ORDER — MIDAZOLAM HCL 2 MG/2ML IJ SOLN: 1.0000 mg | INTRAMUSCULAR | Status: DC | PRN

## 2023-09-24 MED ORDER — SODIUM CHLORIDE 0.9 % IV SOLN: INTRAVENOUS | Status: DC

## 2023-09-24 MED ORDER — LIDOCAINE-EPINEPHRINE 1 %-1:100000 IJ SOLN
10.0000 mL | Freq: Once | INTRAMUSCULAR | Status: AC
  Administered 2023-09-24: 10 mL via INTRADERMAL

## 2023-09-24 MED ORDER — FENTANYL CITRATE PF 50 MCG/ML IJ SOSY: 25.0000 ug | PREFILLED_SYRINGE | INTRAMUSCULAR | Status: DC | PRN

## 2023-09-24 MED ORDER — MIDAZOLAM HCL 2 MG/2ML IJ SOLN
INTRAMUSCULAR | Status: AC | PRN
  Administered 2023-09-24 (×2): 1 mg via INTRAVENOUS

## 2023-09-24 MED ORDER — DEXAMETHASONE SODIUM PHOSPHATE 10 MG/ML IJ SOLN
10.0000 mg | Freq: Once | INTRAMUSCULAR | Status: AC
  Administered 2023-09-24: 10 mg via INTRAVENOUS

## 2023-09-24 MED ORDER — NITROGLYCERIN 1 MG/10 ML FOR IR/CATH LAB
100.0000 ug | INTRA_ARTERIAL | Status: DC | PRN
  Administered 2023-09-24 (×2): 100 ug via INTRA_ARTERIAL

## 2023-09-24 MED ORDER — FENTANYL CITRATE (PF) 100 MCG/2ML IJ SOLN
INTRAMUSCULAR | Status: AC | PRN
  Administered 2023-09-24 (×4): 50 ug via INTRAVENOUS

## 2023-09-24 MED ORDER — IIOPAMIDOL (ISOVUE-250) INJECTION 51%
200.0000 mL | Freq: Once | INTRAVENOUS | Status: AC | PRN
  Administered 2023-09-24: 115 mL via INTRA_ARTERIAL

## 2023-09-24 NOTE — Progress Notes (Signed)
 Pt back in nursing recovery area. Pt still drowsy from procedure but will wake up when spoken to. Pt follows commands, talks in complete sentences and has no complaints at this time. Pt will remain in nurses station until discharged by Radiologist.

## 2023-09-30 ENCOUNTER — Telehealth: Payer: Self-pay

## 2023-10-01 DIAGNOSIS — Z23 Encounter for immunization: Secondary | ICD-10-CM | POA: Diagnosis not present

## 2023-10-08 ENCOUNTER — Telehealth: Payer: Self-pay

## 2023-10-08 ENCOUNTER — Other Ambulatory Visit: Payer: Self-pay | Admitting: Interventional Radiology

## 2023-10-08 DIAGNOSIS — M1712 Unilateral primary osteoarthritis, left knee: Secondary | ICD-10-CM

## 2023-10-08 NOTE — Progress Notes (Signed)
   10/08/2023  Patient ID: Kelly Farley, female   DOB: [birth year], 68 y.o.   MRN: [id number]  Pharmacy Quality Measure Review  This patient is appearing on a report for being at risk of failing the adherence measure for hypertension (ACEi/ARB) medications this calendar year.   Medication: Olmesartan  Last fill date: 09/27/23 for 90 day supply  Spoke with patient since it has not been picked up from pharmacy yet. She plans to pick up today.  [name], PharmD Clinical Pharmacist [phone]

## 2023-10-10 ENCOUNTER — Inpatient Hospital Stay: Admission: RE | Admit: 2023-10-10 | Source: Ambulatory Visit

## 2023-10-10 ENCOUNTER — Inpatient Hospital Stay
Admission: RE | Admit: 2023-10-10 | Discharge: 2023-10-10 | Attending: Interventional Radiology | Admitting: Interventional Radiology

## 2023-10-10 DIAGNOSIS — M1712 Unilateral primary osteoarthritis, left knee: Secondary | ICD-10-CM

## 2023-10-10 HISTORY — PX: IR RADIOLOGIST EVAL & MGMT: IMG5224

## 2023-10-10 NOTE — Progress Notes (Signed)
 Chief Complaint: Patient was seen in consultation today for left knee pain at the request of Ikia Cincotta K  Referring Physician(s): Swanson Farnell K  History of Present Illness: Kelly Farley is a 68 y.o. female with chronic left knee pain who underwent GAE on 09/24/23.  She has been doing well until a recent conference when she did a lot of walking and developed a painful lump behind her knee.  [place] and wants to have this spot checked out to make sure its not a blood clot or other issue that would prevent her travel.   Her knee is otherwise doing well but not very much changed.    Past Medical History:  Diagnosis Date   Arthritis    Diabetes mellitus without complication (HCC)    Hyperlipemia    Hypertension    Other abnormal findings in urine 07/05/2021   Pain in right knee 02/14/2012   Palpitations    wore holter monitor 30 years ago; murmur no longer an issue per patient   Sleep apnea    Sweats, menopausal 10/20/2015    Past Surgical History:  Procedure Laterality Date   ABDOMINAL HYSTERECTOMY     total   BREAST BIOPSY Left 04/03/2022   stereo bx, calcs, RIBBON clip-BENIGN MAMMARY PARENCHYMA WITH FIBROADENOMATOID CHANGES AND ASSOCIATED COARSE DYSTROPHIC CALCIFICATIONS.   BREAST BIOPSY Left 04/03/2022   MM LT BREAST BX W LOC DEV 1ST LESION IMAGE BX SPEC STEREO GUIDE 04/03/2022 [facility]   COLONOSCOPY WITH PROPOFOL  N/A 08/18/2020   Procedure: COLONOSCOPY WITH PROPOFOL ;  Surgeon: Janalyn Keene NOVAK, MD;  Location: [facility];  Service: Endoscopy;  Laterality: N/A;   EYE SURGERY     IR EMBO ARTERIAL NOT HEMORR HEMANG INC GUIDE ROADMAPPING  09/24/2023   IR RADIOLOGIST EVAL & MGMT  05/23/2023   IR RADIOLOGIST EVAL & MGMT  10/10/2023   JOINT REPLACEMENT  01/09/2012   right knee   TRANSFORAMINAL LUMBAR INTERBODY FUSION (TLIF) WITH PEDICLE SCREW FIXATION 2 LEVEL N/A 12/19/2021   Procedure: Lumbar Four- Five Lumbar  Five-Sacral One Open laminectomy/Transforaminal Lumbar Interbody Fusion/Posterolateral instrumented fusion;  Surgeon: Cheryle Debby LABOR, MD;  Location: [facility] OR;  Service: Neurosurgery;  Laterality: N/A;  RM 21/3C/TO FOLLOW    Allergies: Soy allergy (obsolete) and Peanut-containing drug products  Medications: Prior to Admission medications   Medication Sig Start Date End Date Taking? Authorizing Provider  acetaminophen  (TYLENOL ) 500 MG tablet Take 1,000 mg by mouth every 6 (six) hours as needed for moderate pain.    [provider]  albuterol  (VENTOLIN  HFA) 108 (90 Base) MCG/ACT inhaler Inhale 2 puffs into the lungs every 6 (six) hours as needed for wheezing or shortness of breath. 04/17/22   Orlean Alan HERO, FNP  allopurinol  (ZYLOPRIM ) 100 MG tablet Take 1 tablet (100 mg total) by mouth daily. 06/05/23   Tejan-Sie, S Ahmed, MD  aspirin 81 MG chewable tablet Chew 81 mg by mouth once.    [provider]  cetirizine (ZYRTEC) 10 MG tablet Take 10 mg by mouth daily as needed for allergies.    [provider]  colchicine  0.6 MG tablet Take 1 tablet (0.6 mg total) by mouth as directed. Initially take 1 tablet followed by a second one 6 hrs later then one daily till pain free Patient not taking: Reported on 08/28/2023 11/23/22 11/23/23  Albina GORMAN Dine, MD  fluticasone  (FLONASE ) 50 MCG/ACT nasal spray Place 1 spray into both nostrils daily as needed for allergies  or rhinitis.    [provider]  hydrALAZINE  (APRESOLINE ) 50 MG tablet Take 1 tablet (50 mg total) by mouth 3 (three) times daily. 08/28/23 09/27/23  Albina GORMAN Dine, MD  hydrochlorothiazide  (HYDRODIURIL ) 25 MG tablet Take 1 tablet (25 mg total) by mouth daily. 06/05/23   Albina GORMAN Dine, MD  metoprolol  succinate (TOPROL -XL) 100 MG 24 hr tablet Take 1 tablet (100 mg total) by mouth daily. Take with or immediately following a meal. 06/05/23 12/02/23  Albina GORMAN Dine, MD  Multiple Vitamins-Minerals  (CENTRUM SILVER ULTRA WOMENS) TABS Take 1 tablet by mouth daily.    [provider]  NON FORMULARY Pt uses a cpap nightly    [provider]  olmesartan  (BENICAR ) 40 MG tablet Take 1 tablet (40 mg total) by mouth daily. 06/05/23   Albina GORMAN Dine, MD  OVER THE COUNTER MEDICATION Apply 1 Application topically daily as needed (nerve pain in legs). CBD Cream    [provider]  pravastatin  (PRAVACHOL ) 40 MG tablet Take 1 tablet (40 mg total) by mouth at bedtime. 08/28/23 02/24/24  Albina GORMAN Dine, MD  Semaglutide  (RYBELSUS ) 14 MG TABS Take 1 tablet (14 mg total) by mouth daily. 06/10/23   Albina GORMAN Dine, MD     Family History  Problem Relation Age of Onset   Colon cancer Mother    Breast cancer Maternal Aunt     Social History   Socioeconomic History   Marital status: Single    Spouse name: Not on file   Number of children: Not on file   Years of education: Not on file   Highest education level: Not on file  Occupational History   Not on file  Tobacco Use   Smoking status: Never   Smokeless tobacco: Not on file  Vaping Use   Vaping status: Never Used  Substance and Sexual Activity   Alcohol use: No   Drug use: Never   Sexual activity: Not on file  Other Topics Concern   Not on file  Social History Narrative   Not on file   Social Drivers of Health   Financial Resource Strain: Not on file  Food Insecurity: Not on file  Transportation Needs: Not on file  Physical Activity: Not on file  Stress: Not on file  Social Connections: Not on file   Review of Systems: A 12 point ROS discussed and pertinent positives are indicated in the HPI above.  All other systems are negative.  Review of Systems  Vital Signs: BP (!) 160/83 (BP Location: Left Arm, Patient Position: Sitting, Cuff Size: Normal)   Pulse 81   Temp 98.4 F (36.9 C) (Oral)   Resp 16   SpO2 98%    Physical Exam Constitutional:      General: She is not in acute distress.     Appearance: Normal appearance. She is obese.  HENT:     Head: Normocephalic and atraumatic.  Eyes:     General: No scleral icterus. Cardiovascular:     Rate and Rhythm: Normal rate.  Pulmonary:     Effort: Pulmonary effort is normal.  Abdominal:     General: There is no distension.     Tenderness: There is no abdominal tenderness.  Musculoskeletal:       Legs:     Comments: Small 1.5 cm firm rubber nodule in the superficial fat of the left posteromedial calf.  Focus US  shows just mildly heterogeneous adipose tissue  Skin:    General: Skin is  warm and dry.     Findings: No bruising or lesion.  Neurological:     Mental Status: She is alert and oriented to person, place, and time.  Psychiatric:        Mood and Affect: Mood normal.        Behavior: Behavior normal.       Imaging: IR Radiologist Eval & Mgmt Result Date: 10/10/2023 EXAM: NEW PATIENT OFFICE VISIT CHIEF COMPLAINT: SEE NOTE IN EPIC HISTORY OF PRESENT ILLNESS: SEE NOTE IN EPIC REVIEW OF SYSTEMS: SEE NOTE IN EPIC PHYSICAL EXAMINATION: SEE NOTE IN EPIC ASSESSMENT AND PLAN: SEE NOTE IN EPIC Electronically Signed   By: Wilkie Lent M.D.   On: 10/10/2023 09:08   IR EMBO ARTERIAL NOT HEMORR HEMANG INC GUIDE ROADMAPPING Result Date: 09/24/2023 EXAM: IR EMBO ARTERIAL NOT HEMORR HEMANG INC GUIDE ROADMAPPING 1. Ultrasound-guided access left superficial femoral artery 2. Left lower extremity arteriogram 3. Catheterization of the descending geniculate artery with arteriogram 4. Catheterization of an unnamed collateral artery with arteriogram 5. Catheterization of a replaced medial osteoarticular branch of the descending geniculate artery with arteriogram 6. Catheterization of the superomedial geniculate artery with arteriogram 7. Catheterization of the inferior medial geniculate artery with arteriogram 8. Particle embolization MEDICATIONS: 1000 mg Tylenol , 10 mg Decadron  and 30 mg Toradol  all administered intravenously by the  Radiology nurse prior to the procedure. ANESTHESIA/SEDATION: Moderate (conscious) sedation was employed during this procedure. A total of Versed  2 mg and Fentanyl  200 mcg was administered intravenously by the Radiology nurse. Moderate Sedation Time: 70 minutes. The patient's level of consciousness and vital signs were monitored continuously by radiology nursing throughout the procedure under my direct supervision. CONTRAST:  115 mL Isovue  250 FLUOROSCOPY: Radiation Exposure Index (as provided by the fluoroscopic device): 222.1 mGy Kerma COMPLICATIONS: None immediate. PROCEDURE: Informed consent was obtained from the patient following explanation of the procedure, risks, benefits and alternatives. The patient understands, agrees and consents for the procedure. All questions were addressed. A time out was performed prior to the initiation of the procedure. Maximal barrier sterile technique utilized including caps, mask, sterile gowns, sterile gloves, large sterile drape, hand hygiene, and Betadine prep. The left superficial femoral artery was interrogated with ultrasound and found to be widely patent. An image was obtained and stored for the medical record. Local anesthesia was attained by infiltration with 1% lidocaine . A small dermatotomy was made. Under real-time sonographic guidance, the vessel was punctured with a 21 gauge micropuncture needle. Using standard technique, the initial micro needle was exchanged over a 0.018 micro wire for a transitional 4 [place] micro sheath. The micro sheath was then exchanged over a 0.035 wire for a 4 French hydrophilic glide Cobra catheter which was advanced over the wire and into the superficial femoral artery. An arteriogram was performed. There are several branches arising from the medial aspect of the distal superficial femoral artery which may represent the descending geniculate artery. A Progreat lambda triple angled catheter was advanced coaxially through the 5 French  catheter and the first artery was successfully catheterized. Arteriography was performed. This is the cutaneous branch of the descending geniculate artery. No evidence of articular supply. Next, the Progreat lambda triple angle catheter was used to select the second possible descending geniculate artery. Arteriography was performed. Again, this appears to primarily supply superficial soft tissues and likely represents an accessory cutaneous branch of the descending geniculate artery. No evidence of articular supply. Finally, the microcatheter was used to select the third possible descending geniculate  artery. Contrast opacification was performed. There is a small medial osteoarticular branch arising from this accessory descending genicular artery. The microcatheter was further advanced into the medial osteoarticular branch. Contrast was injected confirming slight hyperemia. Particle embolization was performed utilizing a total of 0.2 mL 200 micron Embozene. Follow-up arteriography demonstrates cessation of hyperemia. The microcatheter was removed. Additional angiography was performed through the 5 French catheter. The origin of the superomedial geniculate artery was identified. The microcatheter was reintroduced and used to carefully select the superomedial geniculate artery. Arteriography was again performed. Significant hyperemia along the medial femoral condyle as well as the medial tibial plateau. Particle embolization was again performed. A total of 0.3 mL 200 micron Embozene was administered. Follow-up arteriography demonstrates no further hyperemia. Last, the microcatheter was advanced into the inferior medial geniculate artery. Arteriography was performed. No significant hyperemia. The catheters were removed. Hemostasis was attained by manual pressure. IMPRESSION: 1. Multivessel super selective right lower extremity arteriogram with particle embolization of a small medial osteoarticular branch of the  descending geniculate artery and the dominant superomedial geniculate artery. Electronically Signed   By: Wilkie Lent M.D.   On: 09/24/2023 12:09    Labs:  CBC: No results for input(s): WBC, HGB, HCT, PLT in the last 8760 hours.  COAGS: No results for input(s): INR, APTT in the last 8760 hours.  BMP: Recent Labs    01/28/23 1036 05/22/23 1058 08/23/23 1139  NA 142 141 140  K 4.2 4.2 3.9  CL 100 102 102  CO2 26 22 25   GLUCOSE 94 110* 87  BUN 19 18 23   CALCIUM 10.2 9.8 9.6  CREATININE 1.10* 0.95 1.02*    LIVER FUNCTION TESTS: Recent Labs    01/28/23 1036 05/22/23 1058 08/23/23 1139  BILITOT 0.4 0.3 0.4  AST 31 28 26   ALT 20 19 17   ALKPHOS 78 93 79  PROT 6.6 6.7 6.4  ALBUMIN 4.0 4.1 4.1    TUMOR MARKERS: No results for input(s): AFPTM, CEA, CA199, CHROMGRNA in the last 8760 hours.  Assessment and Plan:  68 year-old female 2 weeks post LEFT GAE.  She has a small focal tender spot in the superficial fat posterior to the left knee.  Bedside focused US  shows no thrombophlebitis, this appears to be a small focus of either fat necrosis or an inflamed lipoma.   Otherwise well. Cleared for air travel to [place]. [place].     1.) F/U in 4-6 weeks    Electronically Signed: Wilkie POUR Malyssa Maris 10/10/2023, 10:32 AM   I spent a total of  25 Minutes in face to face in clinical consultation, greater than 50% of which was counseling/coordinating care for left knee pain

## 2023-10-21 ENCOUNTER — Other Ambulatory Visit: Payer: Self-pay | Admitting: Internal Medicine

## 2023-10-21 DIAGNOSIS — I10 Essential (primary) hypertension: Secondary | ICD-10-CM

## 2023-10-22 ENCOUNTER — Other Ambulatory Visit: Payer: Self-pay | Admitting: Interventional Radiology

## 2023-10-22 DIAGNOSIS — M25562 Pain in left knee: Secondary | ICD-10-CM

## 2023-10-24 ENCOUNTER — Telehealth: Payer: Self-pay

## 2023-10-24 NOTE — Telephone Encounter (Signed)
 Patient LM asking for the prednisone  to be called in one more time for her before she gets any injections she's states she took the last rx for the 5 days and her foot stopped hurting but once it was completed it started hurting again.

## 2023-10-25 ENCOUNTER — Other Ambulatory Visit: Payer: Self-pay | Admitting: Internal Medicine

## 2023-10-25 DIAGNOSIS — M1A9XX0 Chronic gout, unspecified, without tophus (tophi): Secondary | ICD-10-CM

## 2023-10-25 MED ORDER — PREDNISONE 20 MG PO TABS: 40.0000 mg | ORAL_TABLET | Freq: Every day | ORAL | 0 refills | Status: AC

## 2023-11-14 ENCOUNTER — Ambulatory Visit
Admission: RE | Admit: 2023-11-14 | Discharge: 2023-11-14 | Disposition: A | Source: Ambulatory Visit | Attending: Interventional Radiology | Admitting: Interventional Radiology

## 2023-11-14 DIAGNOSIS — M25562 Pain in left knee: Secondary | ICD-10-CM

## 2023-11-14 DIAGNOSIS — M1712 Unilateral primary osteoarthritis, left knee: Secondary | ICD-10-CM | POA: Diagnosis not present

## 2023-11-14 HISTORY — PX: IR RADIOLOGIST EVAL & MGMT: IMG5224

## 2023-11-14 NOTE — Progress Notes (Signed)
 Chief Complaint: Patient was seen in consultation today for chronic left knee pain at the request of Porchea Charrier K  Referring Physician(s): Pj Zehner K  History of Present Illness: Kelly Farley is a 68 y.o. female with chronic left knee pain who underwent GAE on 09/24/23.  She is doing well.  Her Womac score decreased from 60-50.  She notes that she has having an easier time going from a sitting to standing and with overall ambulation.  She does have some aching in the knee when she lays down at night which is new.  Overall, she is pleased with her outcome although she does wish that the pain relief were even greater.  The small sore spot within the subcutaneous fat posterior to the left knee has resolved.  She had an excellent time on her vacation and has had no difficulties there.  We discussed that she may need a touchup therapy at some point in the future if her symptoms become severe again.  She understands and will reach out to the office if that occurs.    Past Medical History:  Diagnosis Date   Arthritis    Diabetes mellitus without complication (HCC)    Hyperlipemia    Hypertension    Other abnormal findings in urine 07/05/2021   Pain in right knee 02/14/2012   Palpitations    wore holter monitor 30 years ago; murmur no longer an issue per patient   Sleep apnea    Sweats, menopausal 10/20/2015    Past Surgical History:  Procedure Laterality Date   ABDOMINAL HYSTERECTOMY     total   BREAST BIOPSY Left 04/03/2022   stereo bx, calcs, RIBBON clip-BENIGN MAMMARY PARENCHYMA WITH FIBROADENOMATOID CHANGES AND ASSOCIATED COARSE DYSTROPHIC CALCIFICATIONS.   BREAST BIOPSY Left 04/03/2022   MM LT BREAST BX W LOC DEV 1ST LESION IMAGE BX SPEC STEREO GUIDE 04/03/2022 [facility]   COLONOSCOPY WITH PROPOFOL  N/A 08/18/2020   Procedure: COLONOSCOPY WITH PROPOFOL ;  Surgeon: Janalyn Keene NOVAK, MD;  Location: [facility];  Service: Endoscopy;  Laterality: N/A;    EYE SURGERY     IR EMBO ARTERIAL NOT HEMORR HEMANG INC GUIDE ROADMAPPING  09/24/2023   IR RADIOLOGIST EVAL & MGMT  05/23/2023   IR RADIOLOGIST EVAL & MGMT  10/10/2023   IR RADIOLOGIST EVAL & MGMT  11/14/2023   JOINT REPLACEMENT  01/09/2012   right knee   TRANSFORAMINAL LUMBAR INTERBODY FUSION (TLIF) WITH PEDICLE SCREW FIXATION 2 LEVEL N/A 12/19/2021   Procedure: Lumbar Four- Five Lumbar Five-Sacral One Open laminectomy/Transforaminal Lumbar Interbody Fusion/Posterolateral instrumented fusion;  Surgeon: Cheryle Debby LABOR, MD;  Location: [facility];  Service: Neurosurgery;  Laterality: N/A;  RM 21/3C/TO FOLLOW    Allergies: Soy allergy (obsolete) and Peanut-containing drug products  Medications: Prior to Admission medications   Medication Sig Start Date End Date Taking? Authorizing Provider  acetaminophen  (TYLENOL ) 500 MG tablet Take 1,000 mg by mouth every 6 (six) hours as needed for moderate pain.    [provider]  albuterol  (VENTOLIN  HFA) 108 (90 Base) MCG/ACT inhaler Inhale 2 puffs into the lungs every 6 (six) hours as needed for wheezing or shortness of breath. 04/17/22   Orlean Alan HERO, FNP  allopurinol  (ZYLOPRIM ) 100 MG tablet Take 1 tablet (100 mg total) by mouth daily. 06/05/23   Tejan-Sie, S Ahmed, MD  aspirin 81 MG chewable tablet Chew 81 mg by mouth once.    [provider]  cetirizine (ZYRTEC) 10 MG tablet Take 10 mg by mouth daily as  needed for allergies.    [provider]  colchicine  0.6 MG tablet Take 1 tablet (0.6 mg total) by mouth as directed. Initially take 1 tablet followed by a second one 6 hrs later then one daily till pain free Patient not taking: Reported on 08/28/2023 11/23/22 11/23/23  Albina GORMAN Dine, MD  fluticasone  (FLONASE ) 50 MCG/ACT nasal spray Place 1 spray into both nostrils daily as needed for allergies or rhinitis.    [provider]  hydrALAZINE  (APRESOLINE ) 50 MG tablet Take 1 tablet (50 mg total) by mouth 3 (three)  times daily. 08/28/23 09/27/23  Albina GORMAN Dine, MD  hydrochlorothiazide  (HYDRODIURIL ) 25 MG tablet Take 1 tablet (25 mg total) by mouth daily. 06/05/23   Albina GORMAN Dine, MD  metoprolol  succinate (TOPROL -XL) 100 MG 24 hr tablet TAKE 1 TABLET BY MOUTH DAILY. TAKE WITH OR IMMEDIATELY FOLLOWING A MEAL. 10/22/23   Albina GORMAN Dine, MD  Multiple Vitamins-Minerals (CENTRUM SILVER ULTRA WOMENS) TABS Take 1 tablet by mouth daily.    [provider]  NON FORMULARY Pt uses a cpap nightly    [provider]  olmesartan  (BENICAR ) 40 MG tablet Take 1 tablet (40 mg total) by mouth daily. 06/05/23   Albina GORMAN Dine, MD  OVER THE COUNTER MEDICATION Apply 1 Application topically daily as needed (nerve pain in legs). CBD Cream    [provider]  pravastatin  (PRAVACHOL ) 40 MG tablet Take 1 tablet (40 mg total) by mouth at bedtime. 08/28/23 02/24/24  Albina GORMAN Dine, MD  Semaglutide  (RYBELSUS ) 14 MG TABS Take 1 tablet (14 mg total) by mouth daily. 06/10/23   Albina GORMAN Dine, MD     Family History  Problem Relation Age of Onset   Colon cancer Mother    Breast cancer Maternal Aunt     Social History   Socioeconomic History   Marital status: Single    Spouse name: Not on file   Number of children: Not on file   Years of education: Not on file   Highest education level: Not on file  Occupational History   Not on file  Tobacco Use   Smoking status: Never   Smokeless tobacco: Not on file  Vaping Use   Vaping status: Never Used  Substance and Sexual Activity   Alcohol use: No   Drug use: Never   Sexual activity: Not on file  Other Topics Concern   Not on file  Social History Narrative   Not on file   Social Drivers of Health   Financial Resource Strain: Not on file  Food Insecurity: Not on file  Transportation Needs: Not on file  Physical Activity: Not on file  Stress: Not on file  Social Connections: Not on file    Review of Systems: A 12 point ROS  discussed and pertinent positives are indicated in the HPI above.  All other systems are negative.  Review of Systems  Vital Signs: BP 129/79 (BP Location: Left Arm, Patient Position: Sitting, Cuff Size: Normal)   Pulse 75   Temp 98 F (36.7 C) (Oral)   Resp 18   SpO2 97%    Physical Exam Constitutional:      General: She is not in acute distress.    Appearance: Normal appearance.  HENT:     Head: Normocephalic and atraumatic.  Eyes:     General: No scleral icterus. Cardiovascular:     Rate and Rhythm: Normal rate.  Pulmonary:     Effort: Pulmonary effort is normal.  Abdominal:     General: There is no distension.     Palpations: Abdomen is soft.     Tenderness: There is no abdominal tenderness.  Musculoskeletal:        General: No swelling, tenderness or deformity.  Skin:    General: Skin is warm and dry.     Findings: No bruising or erythema.  Neurological:     Mental Status: She is alert and oriented to person, place, and time.  Psychiatric:        Mood and Affect: Mood normal.        Behavior: Behavior normal.     Imaging: IR Radiologist Eval & Mgmt Result Date: 11/14/2023 EXAM: ESTABLISHED PATIENT OFFICE VISIT CHIEF COMPLAINT: SEE NOTE IN EPIC HISTORY OF PRESENT ILLNESS: SEE NOTE IN EPIC REVIEW OF SYSTEMS: SEE NOTE IN EPIC PHYSICAL EXAMINATION: SEE NOTE IN EPIC ASSESSMENT AND PLAN: SEE NOTE IN EPIC Electronically Signed   By: Wilkie Lent M.D.   On: 11/14/2023 10:46    Labs:  CBC: No results for input(s): WBC, HGB, HCT, PLT in the last 8760 hours.  COAGS: No results for input(s): INR, APTT in the last 8760 hours.  BMP: Recent Labs    01/28/23 1036 05/22/23 1058 08/23/23 1139  NA 142 141 140  K 4.2 4.2 3.9  CL 100 102 102  CO2 26 22 25   GLUCOSE 94 110* 87  BUN 19 18 23   CALCIUM 10.2 9.8 9.6  CREATININE 1.10* 0.95 1.02*    LIVER FUNCTION TESTS: Recent Labs    01/28/23 1036 05/22/23 1058 08/23/23 1139  BILITOT 0.4 0.3 0.4   AST 31 28 26   ALT 20 19 17   ALKPHOS 78 93 79  PROT 6.6 6.7 6.4  ALBUMIN 4.0 4.1 4.1    TUMOR MARKERS: No results for input(s): AFPTM, CEA, CA199, CHROMGRNA in the last 8760 hours.  Assessment and Plan:  Very pleasant 68 year old female with severe left knee osteoarthritis and chronic knee pain.  She is now 6 weeks status post left geniculate artery embolization.  Overall, she is doing well.  Her Womac score decreased from 60 to 50 which is approximately a 17 point reduction in overall symptom severity.  No current acute complaints.  She will reach out to our office if symptoms become severe again in the future and she would like to consider a touchup embolization.    Electronically Signed: Wilkie MARLA Lent 11/14/2023, 11:28 AM   I spent a total of  15 Minutes in face to face in clinical consultation, greater than 50% of which was counseling/coordinating care for chronic left knee pain.

## 2023-11-22 ENCOUNTER — Telehealth: Payer: Self-pay | Admitting: Family Medicine

## 2023-11-22 DIAGNOSIS — H59811 Chorioretinal scars after surgery for detachment, right eye: Secondary | ICD-10-CM | POA: Diagnosis not present

## 2023-11-22 DIAGNOSIS — Z9842 Cataract extraction status, left eye: Secondary | ICD-10-CM | POA: Diagnosis not present

## 2023-11-22 DIAGNOSIS — Z9841 Cataract extraction status, right eye: Secondary | ICD-10-CM | POA: Diagnosis not present

## 2023-11-22 DIAGNOSIS — H52223 Regular astigmatism, bilateral: Secondary | ICD-10-CM | POA: Diagnosis not present

## 2023-11-22 DIAGNOSIS — E113293 Type 2 diabetes mellitus with mild nonproliferative diabetic retinopathy without macular edema, bilateral: Secondary | ICD-10-CM | POA: Diagnosis not present

## 2023-11-22 NOTE — Telephone Encounter (Signed)
 Patient called stating that she recently had the GAE procedure but she is still have pain.  She asked if she would be able to have the Zilretta  injection again? That seemed to help more than the procedure did.

## 2023-11-25 ENCOUNTER — Encounter: Payer: Self-pay | Admitting: *Deleted

## 2023-11-25 NOTE — Telephone Encounter (Signed)
 Last Zilretta  injection, L knee, done 05/21/23.   Bri and Trayce, do we need to re-run benefits again or are benefits from 8/202/25 still valid?

## 2023-11-25 NOTE — Progress Notes (Signed)
 Kelly Farley                                          MRN: [id number]   11/25/2023   The [facility] Quality Team Specialist reviewed this patient medical record for the purposes of chart review for care gap closure. The following were reviewed: abstraction for care gap closure-kidney health evaluation for diabetes:eGFR  and uACR.    [facility] Quality Team

## 2023-11-26 ENCOUNTER — Other Ambulatory Visit: Payer: Self-pay

## 2023-11-26 ENCOUNTER — Encounter: Payer: Self-pay | Admitting: Family Medicine

## 2023-11-26 ENCOUNTER — Ambulatory Visit (INDEPENDENT_AMBULATORY_CARE_PROVIDER_SITE_OTHER): Admitting: Family Medicine

## 2023-11-26 VITALS — BP 138/82 | HR 76 | Ht 67.0 in | Wt 260.0 lb

## 2023-11-26 DIAGNOSIS — M1712 Unilateral primary osteoarthritis, left knee: Secondary | ICD-10-CM

## 2023-11-26 DIAGNOSIS — M25562 Pain in left knee: Secondary | ICD-10-CM | POA: Diagnosis not present

## 2023-11-26 DIAGNOSIS — G8929 Other chronic pain: Secondary | ICD-10-CM | POA: Diagnosis not present

## 2023-11-26 MED ORDER — TRIAMCINOLONE ACETONIDE 32 MG IX SRER
32.0000 mg | Freq: Once | INTRA_ARTICULAR | Status: AC
  Administered 2023-11-26: 32 mg via INTRA_ARTICULAR

## 2023-11-26 NOTE — Patient Instructions (Addendum)
 Thank you for coming in today.   You received an injection today. Seek immediate medical attention if the joint becomes red, extremely painful, or is oozing fluid.   See you back as needed.

## 2023-11-26 NOTE — Progress Notes (Signed)
   I, Leotis Batter, CMA acting as a scribe for Artist Lloyd, MD.  Kelly Farley is a 68 y.o. female who presents to [facility] at [facility] today for exacerbation of her L knee pain. Pt was last seen by Dr. Lloyd on 05/21/23 and was given a repeat L knee Zilretta  injection and was referred to IR for GAE consult.    L GAE performed on 09/24/23.  Today, pt reports continued knee pain.  Overall she is doing okay but notes continued pain. States that is is somewhat easier with sit-to-stand but still ambulating with a cane today. Sx wax and wane. States that pain feels different after GAE than it did prior to GAE. Notes more relief with Zilretta  injection, would like to repeat today.   Pertinent review of systems: No fevers or chills  Relevant historical information: Diabetes hypertension.   Exam:  BP 138/82   Pulse 76   Ht 5' 7 (1.702 m)   Wt 260 lb (117.9 kg)   SpO2 98%   BMI 40.72 kg/m  General: Well Developed, well nourished, and in no acute distress.   MSK: Left knee mild effusion normal-appearing otherwise normal motion with crepitation.    Lab and Radiology Results   Zilretta  injection left knee Procedure: Real-time Ultrasound Guided Injection of left knee joint superior lateral patellar space Device: Philips Affiniti 50G Images permanently stored and available for review in PACS Verbal informed consent obtained.  Discussed risks and benefits of procedure. Warned about infection, hyperglycemia bleeding, damage to structures among others. Patient expresses understanding and agreement Time-out conducted.   Noted no overlying erythema, induration, or other signs of local infection.   Skin prepped in a sterile fashion.   Local anesthesia: Topical Ethyl chloride.   With sterile technique and under real time ultrasound guidance: Zilretta  32 mg injected into knee joint. Fluid seen entering the joint capsule.   Completed without difficulty   Advised to call  if fevers/chills, erythema, induration, drainage, or persistent bleeding.   Images permanently stored and available for review in the ultrasound unit.  Impression: Technically successful ultrasound guided injection.  Lot number: [id number]     Assessment and Plan: 68 y.o. female with left knee pain due to DJD.  Plan for Zilretta  injection.  Continue quad strengthening exercises check back as needed.  Can repeat this injection every 3 months if needed.   PDMP not reviewed this encounter. Orders Placed This Encounter  Procedures   US  LIMITED JOINT SPACE STRUCTURES LOW LEFT(NO LINKED CHARGES)    Reason for Exam (SYMPTOM  OR DIAGNOSIS REQUIRED):   left knee pain OA    Preferred imaging location?:    [facility] ordered this encounter  Medications   Triamcinolone  Acetonide (ZILRETTA ) intra-articular injection 32 mg     Discussed warning signs or symptoms. Please see discharge instructions. Patient expresses understanding.   The above documentation has been reviewed and is accurate and complete Artist Lloyd, M.D.

## 2023-11-26 NOTE — Telephone Encounter (Signed)
 Please reach out to pt to assist with scheduling Zilretta  for left knee OA.

## 2023-11-26 NOTE — Telephone Encounter (Signed)
 Scheduled

## 2023-11-29 ENCOUNTER — Other Ambulatory Visit

## 2023-11-29 ENCOUNTER — Encounter: Payer: Self-pay | Admitting: Internal Medicine

## 2023-11-29 DIAGNOSIS — E119 Type 2 diabetes mellitus without complications: Secondary | ICD-10-CM | POA: Diagnosis not present

## 2023-11-29 DIAGNOSIS — E7800 Pure hypercholesterolemia, unspecified: Secondary | ICD-10-CM

## 2023-11-29 NOTE — Telephone Encounter (Signed)
 Had injection 11/26/23  Zilretta  authorized for left knee NO PRE CERT REQUIRED Patient responsible for 20% coinsurance Copay $15 Deductible does not apply OOP MAX $3500 has met $315.43 Once OOP has been met coverage goes to 100% and copay will no longer apply Reference # [id number]

## 2023-11-30 LAB — LIPID PANEL
Chol/HDL Ratio: 2.5 ratio (ref 0.0–4.4)
Cholesterol, Total: 155 mg/dL (ref 100–199)
HDL: 61 mg/dL (ref >39)
LDL Chol Calc (NIH): 83 mg/dL (ref 0–99)
Triglycerides: 55 mg/dL (ref 0–149)
VLDL Cholesterol Cal: 11 mg/dL (ref 5–40)

## 2023-11-30 LAB — HEMOGLOBIN A1C
Est. average glucose Bld gHb Est-mCnc: 105 mg/dL
Hgb A1c MFr Bld: 5.3 % (ref 4.8–5.6)

## 2023-12-03 ENCOUNTER — Ambulatory Visit: Payer: Self-pay | Admitting: Internal Medicine

## 2023-12-03 ENCOUNTER — Ambulatory Visit (INDEPENDENT_AMBULATORY_CARE_PROVIDER_SITE_OTHER): Admitting: Internal Medicine

## 2023-12-03 VITALS — BP 122/74 | HR 76 | Temp 97.7°F | Ht 67.0 in | Wt 258.2 lb

## 2023-12-03 DIAGNOSIS — Z1331 Encounter for screening for depression: Secondary | ICD-10-CM

## 2023-12-03 DIAGNOSIS — E7800 Pure hypercholesterolemia, unspecified: Secondary | ICD-10-CM | POA: Diagnosis not present

## 2023-12-03 DIAGNOSIS — E119 Type 2 diabetes mellitus without complications: Secondary | ICD-10-CM

## 2023-12-03 DIAGNOSIS — I10 Essential (primary) hypertension: Secondary | ICD-10-CM | POA: Diagnosis not present

## 2023-12-03 DIAGNOSIS — M7662 Achilles tendinitis, left leg: Secondary | ICD-10-CM

## 2023-12-03 DIAGNOSIS — M1A9XX0 Chronic gout, unspecified, without tophus (tophi): Secondary | ICD-10-CM

## 2023-12-03 DIAGNOSIS — E1165 Type 2 diabetes mellitus with hyperglycemia: Secondary | ICD-10-CM | POA: Diagnosis not present

## 2023-12-03 DIAGNOSIS — Z0001 Encounter for general adult medical examination with abnormal findings: Secondary | ICD-10-CM | POA: Diagnosis not present

## 2023-12-03 LAB — POCT CBG (FASTING - GLUCOSE)-MANUAL ENTRY: Glucose Fasting, POC: 102 mg/dL — AB (ref 70–99)

## 2023-12-03 LAB — POC CREATINE & ALBUMIN,URINE
Creatinine, POC: 200 mg/dL
Microalbumin Ur, POC: 30 mg/L

## 2023-12-03 MED ORDER — ALLOPURINOL 100 MG PO TABS: 100.0000 mg | ORAL_TABLET | Freq: Every day | ORAL | 1 refills | Status: AC

## 2023-12-03 MED ORDER — OLMESARTAN MEDOXOMIL 40 MG PO TABS: 40.0000 mg | ORAL_TABLET | Freq: Every day | ORAL | 1 refills | Status: AC

## 2023-12-03 MED ORDER — HYDRALAZINE HCL 50 MG PO TABS: 50.0000 mg | ORAL_TABLET | Freq: Three times a day (TID) | ORAL | 1 refills | Status: AC

## 2023-12-03 MED ORDER — HYDROCHLOROTHIAZIDE 25 MG PO TABS: 25.0000 mg | ORAL_TABLET | Freq: Every day | ORAL | 1 refills | Status: AC

## 2023-12-03 MED ORDER — RYBELSUS 7 MG PO TABS: 7.0000 mg | ORAL_TABLET | Freq: Every day | ORAL | 0 refills | Status: DC

## 2023-12-03 NOTE — Progress Notes (Signed)
 Established Patient Office Visit  Subjective:  Patient ID: Kelly Farley, female    DOB: 06-06-1955  Age: 68 y.o. MRN: [id number]  Chief Complaint  Patient presents with  . Annual Exam    3 month AWV lab results, needs ckd    No new complaints, here for AWV refer to quality metrics and scanned documents.   Also here for lab review and medication refills. Labs reviewed and notable for well controlled diabetes, A1c at target and lipids at target. Denies any hypoglycemic episodes and home bg readings have been at target.     No other concerns at this time.   Past Medical History:  Diagnosis Date  . Arthritis   . Diabetes mellitus without complication (HCC)   . Hyperlipemia   . Hypertension   . Other abnormal findings in urine 07/05/2021  . Pain in right knee 02/14/2012  . Palpitations    wore holter monitor 30 years ago; murmur no longer an issue per patient  . Sleep apnea   . Sweats, menopausal 10/20/2015    Past Surgical History:  Procedure Laterality Date  . ABDOMINAL HYSTERECTOMY     total  . BREAST BIOPSY Left 04/03/2022   stereo bx, calcs, RIBBON clip-BENIGN MAMMARY PARENCHYMA WITH FIBROADENOMATOID CHANGES AND ASSOCIATED COARSE DYSTROPHIC CALCIFICATIONS.  SABRA BREAST BIOPSY Left 04/03/2022   MM LT BREAST BX W LOC DEV 1ST LESION IMAGE BX SPEC STEREO GUIDE 04/03/2022 [facility]  . COLONOSCOPY WITH PROPOFOL  N/A 08/18/2020   Procedure: COLONOSCOPY WITH PROPOFOL ;  Surgeon: Janalyn Keene NOVAK, MD;  Location: [facility];  Service: Endoscopy;  Laterality: N/A;  . EYE SURGERY    . IR EMBO ARTERIAL NOT HEMORR HEMANG INC GUIDE ROADMAPPING  09/24/2023  . IR RADIOLOGIST EVAL & MGMT  05/23/2023  . IR RADIOLOGIST EVAL & MGMT  10/10/2023  . IR RADIOLOGIST EVAL & MGMT  11/14/2023  . JOINT REPLACEMENT  01/09/2012   right knee  . TRANSFORAMINAL LUMBAR INTERBODY FUSION (TLIF) WITH PEDICLE SCREW FIXATION 2 LEVEL N/A 12/19/2021   Procedure: Lumbar Four- Five Lumbar Five-Sacral  One Open laminectomy/Transforaminal Lumbar Interbody Fusion/Posterolateral instrumented fusion;  Surgeon: Cheryle Debby LABOR, MD;  Location: [facility];  Service: Neurosurgery;  Laterality: N/A;  RM 21/3C/TO FOLLOW    Social History   Socioeconomic History  . Marital status: Single    Spouse name: Not on file  . Number of children: Not on file  . Years of education: Not on file  . Highest education level: Not on file  Occupational History  . Not on file  Tobacco Use  . Smoking status: Never  . Smokeless tobacco: Not on file  Vaping Use  . Vaping status: Never Used  Substance and Sexual Activity  . Alcohol use: No  . Drug use: Never  . Sexual activity: Not on file  Other Topics Concern  . Not on file  Social History Narrative  . Not on file   Social Drivers of Health   Financial Resource Strain: Not on file  Food Insecurity: Not on file  Transportation Needs: Not on file  Physical Activity: Not on file  Stress: Not on file  Social Connections: Not on file  Intimate Partner Violence: Not on file    Family History  Problem Relation Age of Onset  . Colon cancer Mother   . Breast cancer Maternal Aunt     Allergies  Allergen Reactions  . Soy Allergy (Obsolete) Hives and Swelling    Throat swelling   . Peanut-Containing Drug  Products Swelling and Rash    Outpatient Medications Prior to Visit  Medication Sig  . acetaminophen  (TYLENOL ) 500 MG tablet Take 1,000 mg by mouth every 6 (six) hours as needed for moderate pain.  . albuterol  (VENTOLIN  HFA) 108 (90 Base) MCG/ACT inhaler Inhale 2 puffs into the lungs every 6 (six) hours as needed for wheezing or shortness of breath.  SABRA aspirin 81 MG chewable tablet Chew 81 mg by mouth once.  . cetirizine (ZYRTEC) 10 MG tablet Take 10 mg by mouth daily as needed for allergies.  . fluticasone  (FLONASE ) 50 MCG/ACT nasal spray Place 1 spray into both nostrils daily as needed for allergies or rhinitis.  . Multiple Vitamins-Minerals  (CENTRUM SILVER ULTRA WOMENS) TABS Take 1 tablet by mouth daily.  . pravastatin  (PRAVACHOL ) 40 MG tablet Take 1 tablet (40 mg total) by mouth at bedtime.  . [DISCONTINUED] allopurinol  (ZYLOPRIM ) 100 MG tablet Take 1 tablet (100 mg total) by mouth daily.  . [DISCONTINUED] hydrALAZINE  (APRESOLINE ) 50 MG tablet Take 1 tablet (50 mg total) by mouth 3 (three) times daily.  . [DISCONTINUED] hydrochlorothiazide  (HYDRODIURIL ) 25 MG tablet Take 1 tablet (25 mg total) by mouth daily.  . [DISCONTINUED] Semaglutide  (RYBELSUS ) 14 MG TABS Take 1 tablet (14 mg total) by mouth daily.  . colchicine  0.6 MG tablet Take 1 tablet (0.6 mg total) by mouth as directed. Initially take 1 tablet followed by a second one 6 hrs later then one daily till pain free (Patient not taking: Reported on 12/03/2023)  . metoprolol  succinate (TOPROL -XL) 100 MG 24 hr tablet TAKE 1 TABLET BY MOUTH DAILY. TAKE WITH OR IMMEDIATELY FOLLOWING A MEAL. (Patient not taking: Reported on 12/03/2023)  . NON FORMULARY Pt uses a cpap nightly  . OVER THE COUNTER MEDICATION Apply 1 Application topically daily as needed (nerve pain in legs). CBD Cream (Patient not taking: Reported on 12/03/2023)  . [DISCONTINUED] olmesartan  (BENICAR ) 40 MG tablet Take 1 tablet (40 mg total) by mouth daily. (Patient not taking: Reported on 12/03/2023)   No facility-administered medications prior to visit.    Review of Systems  Constitutional:  Positive for weight loss (2 lbs).       With diet and exercise  HENT: Negative.    Eyes: Negative.   Respiratory: Negative.    Cardiovascular: Negative.   Gastrointestinal:        Bloating  Genitourinary: Negative.   Musculoskeletal:        Mild  Skin: Negative.   Neurological: Negative.        Numbness in right leg which is improving  Endo/Heme/Allergies: Negative.        Objective:   BP 122/74   Pulse 76   Temp 97.7 F (36.5 C)   Ht 5' 7 (1.702 m)   Wt 258 lb 3.2 oz (117.1 kg)   SpO2 98%   BMI 40.44  kg/m   Vitals:   12/03/23 1108  BP: 122/74  Pulse: 76  Temp: 97.7 F (36.5 C)  Height: 5' 7 (1.702 m)  Weight: 258 lb 3.2 oz (117.1 kg)  SpO2: 98%  BMI (Calculated): 40.43    Physical Exam Vitals reviewed.  Constitutional:      General: She is not in acute distress.    Appearance: She is obese.  HENT:     Head: Normocephalic.     Nose: Nose normal.     Mouth/Throat:     Mouth: Mucous membranes are moist.  Eyes:     Extraocular Movements: Extraocular movements  intact.     Pupils: Pupils are equal, round, and reactive to light.  Cardiovascular:     Rate and Rhythm: Normal rate and regular rhythm.     Heart sounds: No murmur heard. Pulmonary:     Effort: Pulmonary effort is normal.     Breath sounds: No rhonchi or rales.  Abdominal:     General: Abdomen is flat.     Palpations: There is no hepatomegaly, splenomegaly or mass.  Musculoskeletal:        General: Normal range of motion.     Cervical back: Normal range of motion. No tenderness.     Left ankle: Swelling (achilles) present. Tenderness present.  Skin:    General: Skin is warm and dry.  Neurological:     General: No focal deficit present.     Mental Status: She is alert and oriented to person, place, and time.     Cranial Nerves: No cranial nerve deficit.     Motor: No weakness.  Psychiatric:        Mood and Affect: Mood normal.        Behavior: Behavior normal.      Results for orders placed or performed in visit on 12/03/23  POCT CBG (Fasting - Glucose)  Result Value Ref Range   Glucose Fasting, POC 102 (A) 70 - 99 mg/dL    Recent Results (from the past 2160 hours)  Lipid panel     Status: None   Collection Time: 11/29/23 11:13 AM  Result Value Ref Range   Cholesterol, Total 155 100 - 199 mg/dL   Triglycerides 55 0 - 149 mg/dL   HDL 61 >60 mg/dL   VLDL Cholesterol Cal 11 5 - 40 mg/dL   LDL Chol Calc (NIH) 83 0 - 99 mg/dL   Chol/HDL Ratio 2.5 0.0 - 4.4 ratio    Comment:                                    T. Chol/HDL Ratio                                             Men  Women                               1/2 Avg.Risk  3.4    3.3                                   Avg.Risk  5.0    4.4                                2X Avg.Risk  9.6    7.1                                3X Avg.Risk 23.4   11.0   Hemoglobin A1c     Status: None   Collection Time: 11/29/23 11:13 AM  Result Value Ref Range   Hgb A1c MFr Bld 5.3 4.8 - 5.6 %    Comment:  Prediabetes: 5.7 - 6.4          Diabetes: >6.4          Glycemic control for adults with diabetes: <7.0    Est. average glucose Bld gHb Est-mCnc 105 mg/dL  POCT CBG (Fasting - Glucose)     Status: Abnormal   Collection Time: 12/03/23 11:16 AM  Result Value Ref Range   Glucose Fasting, POC 102 (A) 70 - 99 mg/dL      Assessment & Plan:  Kerin was seen today for annual exam.  Type 2 diabetes mellitus without complication, without long-term current use of insulin  (HCC) -     POCT CBG (Fasting - Glucose) -     Hemoglobin A1c  Primary hypertension -     hydrALAZINE  HCl; Take 1 tablet (50 mg total) by mouth 3 (three) times daily.  Dispense: 270 tablet; Refill: 1 -     hydroCHLOROthiazide ; Take 1 tablet (25 mg total) by mouth daily.  Dispense: 90 tablet; Refill: 1 -     Olmesartan  Medoxomil; Take 1 tablet (40 mg total) by mouth daily.  Dispense: 90 tablet; Refill: 1  Chronic gout without tophus, unspecified cause, unspecified site -     Allopurinol ; Take 1 tablet (100 mg total) by mouth daily.  Dispense: 90 tablet; Refill: 1  Achilles tendinitis of left lower extremity -     Ambulatory referral to Podiatry  Pure hypercholesterolemia -     Lipid panel  Other orders -     Rybelsus ; Take 1 tablet (7 mg total) by mouth daily.  Dispense: 30 tablet; Refill: 0    Problem List Items Addressed This Visit       Cardiovascular and Mediastinum   Hypertension   Relevant Medications   hydrALAZINE  (APRESOLINE ) 50 MG tablet    hydrochlorothiazide  (HYDRODIURIL ) 25 MG tablet   olmesartan  (BENICAR ) 40 MG tablet     Endocrine   Type 2 diabetes mellitus without complications (HCC) - Primary   Relevant Medications   Semaglutide  (RYBELSUS ) 7 MG TABS   olmesartan  (BENICAR ) 40 MG tablet   Other Relevant Orders   POCT CBG (Fasting - Glucose) (Completed)   Other Visit Diagnoses       Chronic gout without tophus, unspecified cause, unspecified site       Relevant Medications   allopurinol  (ZYLOPRIM ) 100 MG tablet     Achilles tendinitis of left lower extremity       Relevant Orders   Ambulatory referral to Podiatry     Pure hypercholesterolemia       Relevant Medications   hydrALAZINE  (APRESOLINE ) 50 MG tablet   hydrochlorothiazide  (HYDRODIURIL ) 25 MG tablet   olmesartan  (BENICAR ) 40 MG tablet       Return in 3 months (on 03/04/2024) for fu with labs prior.   Total time spent: 30 minutes. This time includes review of previous notes and results and patient face to face interaction during today'Chistopher Mangino visit.    Sherrill Cinderella Perry, MD  12/03/2023   This document may have been prepared by [facility] Voice Recognition software and as such may include unintentional dictation errors.

## 2023-12-07 ENCOUNTER — Other Ambulatory Visit: Payer: Self-pay | Admitting: Internal Medicine

## 2023-12-09 NOTE — Progress Notes (Signed)
   12/09/2023  Patient ID: Kelly Farley, female   DOB: 09/28/1955, 68 y.o.   MRN: [id number]  Pharmacy Quality Measure Review  This patient is appearing on a report for being at risk of failing the adherence measure for cholesterol (statin) medications this calendar year.   Medication: Pravastatin  40mg  Last fill date: 12/01/23 for 90 day supply  Insurance report was not up to date. No action needed at this time.   [name], PharmD Clinical Pharmacist [phone]

## 2023-12-26 ENCOUNTER — Ambulatory Visit: Admitting: Podiatry

## 2023-12-26 ENCOUNTER — Telehealth: Payer: Self-pay | Admitting: Family Medicine

## 2023-12-26 DIAGNOSIS — M7662 Achilles tendinitis, left leg: Secondary | ICD-10-CM | POA: Diagnosis not present

## 2023-12-26 NOTE — Progress Notes (Unsigned)
 Left Achilles tendinitis Cam boot

## 2023-12-26 NOTE — Telephone Encounter (Signed)
 Forwarding to Dr. Joane to review and advise.   Per visit note 11/26/23:  Assessment and Plan: 68 y.o. female with left knee pain due to DJD.  Plan for Zilretta  injection.  Continue quad strengthening exercises check back as needed.  Can repeat this injection every 3 months if needed.

## 2023-12-26 NOTE — Telephone Encounter (Signed)
 Patient had a Zilretta  injection in November but at this point it does not seem like it is helping.  She had an GAE procedure and feels like it has made her knee worse.  She would like to know what to do from here.

## 2023-12-27 NOTE — Telephone Encounter (Signed)
 I think we are at the limits of what I can do to manage pain.  If patient is interested in chronic pain medication recommend either following back with PCP or referral to [facility].  Only thing next to do would be a knee replacement which we can certainly offer as a consultation.

## 2024-01-01 ENCOUNTER — Other Ambulatory Visit: Payer: Self-pay | Admitting: Internal Medicine

## 2024-01-23 ENCOUNTER — Ambulatory Visit: Admitting: Podiatry

## 2024-02-04 ENCOUNTER — Ambulatory Visit: Admitting: Podiatry

## 2024-02-07 ENCOUNTER — Telehealth: Payer: Self-pay

## 2024-02-07 NOTE — Telephone Encounter (Signed)
 Pt asked for an update on her PA for her Rybelsus .

## 2024-03-04 ENCOUNTER — Ambulatory Visit: Admitting: Internal Medicine
